# Patient Record
Sex: Female | Born: 1935 | Race: Black or African American | Hispanic: No | Marital: Single | Smoking: Never smoker
Health system: Southern US, Community
[De-identification: ages and names within clinical notes are randomized; demographics above are authoritative.]

## PROBLEM LIST (undated history)

## (undated) DIAGNOSIS — I10 Essential (primary) hypertension: Secondary | ICD-10-CM

## (undated) DIAGNOSIS — M1990 Unspecified osteoarthritis, unspecified site: Secondary | ICD-10-CM

## (undated) DIAGNOSIS — G309 Alzheimer's disease, unspecified: Secondary | ICD-10-CM

## (undated) DIAGNOSIS — H25019 Cortical age-related cataract, unspecified eye: Secondary | ICD-10-CM

## (undated) DIAGNOSIS — H409 Unspecified glaucoma: Secondary | ICD-10-CM

## (undated) DIAGNOSIS — H547 Unspecified visual loss: Secondary | ICD-10-CM

## (undated) DIAGNOSIS — F0280 Dementia in other diseases classified elsewhere without behavioral disturbance: Secondary | ICD-10-CM

## (undated) DIAGNOSIS — F0390 Unspecified dementia without behavioral disturbance: Secondary | ICD-10-CM

## (undated) DIAGNOSIS — H269 Unspecified cataract: Secondary | ICD-10-CM

## (undated) HISTORY — DX: Cortical age-related cataract, unspecified eye: H25.019

## (undated) HISTORY — PX: ABDOMINAL HYSTERECTOMY: SHX81

## (undated) HISTORY — DX: Unspecified osteoarthritis, unspecified site: M19.90

## (undated) HISTORY — DX: Unspecified cataract: H26.9

## (undated) HISTORY — DX: Unspecified glaucoma: H40.9

## (undated) HISTORY — DX: Essential (primary) hypertension: I10

## (undated) HISTORY — PX: CATARACT EXTRACTION: SUR2

## (undated) NOTE — *Deleted (*Deleted)
Pharmacy Antibiotic Note  Erica Vasquez is a 59 y.o. female admitted on 2020-09-11 with sepsis.  Pharmacy has been consulted for vancomycin + cefepime dosing.  Pt has PMH significant for advanced dementia, HTN. Broad spectrum antibiotics being initiated.  Today, 08/22/20  WBC 3.8  SCr 0.82, CrCl ~  Lactate 4.7  Plan:    Height: 5' (152.4 cm) IBW/kg (Calculated) : 45.5  Temp (24hrs), Avg:98 F (36.7 C), Min:97.8 F (36.6 C), Max:98.3 F (36.8 C)  Recent Labs  Lab 09/11/2020 2035 09/11/2020 2130 09/11/2020 2351 08/22/20 0210 08/22/20 1158  WBC 3.4*  --   --  3.8*  --   CREATININE 0.92 0.90  --  0.82  --   LATICACIDVEN 2.1*  --  4.7*  --  4.7*    CrCl cannot be calculated (Unknown ideal weight.).    Allergies  Allergen Reactions  . Bimatoprost Itching  . Brimonidine Itching  . Timolol Itching    Antimicrobials this admission: Cefepime 11/1 >>  Vancomycin 11/1 >>   Dose adjustments this admission:  Microbiology results: 10/31 BCx: ngtd 10/31 UCx:    Thank you for allowing pharmacy to be a part of this patient's care.  Cindi Carbon, PharmD 08/22/2020 7:12 PM

---

## 2007-02-21 ENCOUNTER — Encounter: Admission: RE | Admit: 2007-02-21 | Discharge: 2007-02-21 | Payer: Self-pay | Admitting: Cardiology

## 2007-04-18 ENCOUNTER — Encounter: Admission: RE | Admit: 2007-04-18 | Discharge: 2007-04-18 | Payer: Self-pay | Admitting: Cardiology

## 2008-01-15 ENCOUNTER — Encounter: Admission: RE | Admit: 2008-01-15 | Discharge: 2008-01-15 | Payer: Self-pay | Admitting: Cardiology

## 2008-10-22 HISTORY — PX: GLAUCOMA SURGERY: SHX656

## 2009-08-02 ENCOUNTER — Encounter: Admission: RE | Admit: 2009-08-02 | Discharge: 2009-08-02 | Payer: Self-pay | Admitting: Cardiology

## 2009-09-02 ENCOUNTER — Encounter: Admission: RE | Admit: 2009-09-02 | Discharge: 2009-09-02 | Payer: Self-pay | Admitting: Cardiology

## 2010-11-12 ENCOUNTER — Encounter: Payer: Self-pay | Admitting: Cardiology

## 2010-11-13 ENCOUNTER — Encounter: Payer: Self-pay | Admitting: Cardiology

## 2015-03-16 DIAGNOSIS — H4011X3 Primary open-angle glaucoma, severe stage: Secondary | ICD-10-CM | POA: Diagnosis not present

## 2015-05-18 DIAGNOSIS — H4011X3 Primary open-angle glaucoma, severe stage: Secondary | ICD-10-CM | POA: Diagnosis not present

## 2015-05-18 DIAGNOSIS — G309 Alzheimer's disease, unspecified: Secondary | ICD-10-CM | POA: Diagnosis not present

## 2015-06-02 DIAGNOSIS — R52 Pain, unspecified: Secondary | ICD-10-CM | POA: Diagnosis not present

## 2015-06-02 DIAGNOSIS — K5901 Slow transit constipation: Secondary | ICD-10-CM | POA: Diagnosis not present

## 2018-01-25 ENCOUNTER — Other Ambulatory Visit: Payer: Self-pay

## 2018-01-25 ENCOUNTER — Emergency Department (HOSPITAL_COMMUNITY): Payer: Medicare Other

## 2018-01-25 ENCOUNTER — Encounter (HOSPITAL_COMMUNITY): Payer: Self-pay | Admitting: Emergency Medicine

## 2018-01-25 ENCOUNTER — Inpatient Hospital Stay (HOSPITAL_COMMUNITY)
Admission: EM | Admit: 2018-01-25 | Discharge: 2018-01-30 | DRG: 470 | Disposition: A | Discharge status: Skilled Nursing Facility | Payer: Medicare Other | Attending: Internal Medicine | Admitting: Internal Medicine

## 2018-01-25 DIAGNOSIS — G309 Alzheimer's disease, unspecified: Secondary | ICD-10-CM | POA: Diagnosis present

## 2018-01-25 DIAGNOSIS — M898X5 Other specified disorders of bone, thigh: Secondary | ICD-10-CM | POA: Diagnosis not present

## 2018-01-25 DIAGNOSIS — E440 Moderate protein-calorie malnutrition: Secondary | ICD-10-CM | POA: Diagnosis present

## 2018-01-25 DIAGNOSIS — E876 Hypokalemia: Secondary | ICD-10-CM | POA: Diagnosis present

## 2018-01-25 DIAGNOSIS — H543 Unqualified visual loss, both eyes: Secondary | ICD-10-CM | POA: Diagnosis present

## 2018-01-25 DIAGNOSIS — H547 Unspecified visual loss: Secondary | ICD-10-CM | POA: Diagnosis not present

## 2018-01-25 DIAGNOSIS — W19XXXA Unspecified fall, initial encounter: Secondary | ICD-10-CM | POA: Diagnosis not present

## 2018-01-25 DIAGNOSIS — D62 Acute posthemorrhagic anemia: Secondary | ICD-10-CM | POA: Diagnosis not present

## 2018-01-25 DIAGNOSIS — Z96649 Presence of unspecified artificial hip joint: Secondary | ICD-10-CM

## 2018-01-25 DIAGNOSIS — I10 Essential (primary) hypertension: Secondary | ICD-10-CM | POA: Diagnosis present

## 2018-01-25 DIAGNOSIS — S72001A Fracture of unspecified part of neck of right femur, initial encounter for closed fracture: Secondary | ICD-10-CM | POA: Diagnosis not present

## 2018-01-25 DIAGNOSIS — W1830XA Fall on same level, unspecified, initial encounter: Secondary | ICD-10-CM | POA: Diagnosis present

## 2018-01-25 DIAGNOSIS — R509 Fever, unspecified: Secondary | ICD-10-CM | POA: Diagnosis not present

## 2018-01-25 DIAGNOSIS — F0280 Dementia in other diseases classified elsewhere without behavioral disturbance: Secondary | ICD-10-CM | POA: Diagnosis present

## 2018-01-25 DIAGNOSIS — M899 Disorder of bone, unspecified: Secondary | ICD-10-CM | POA: Diagnosis present

## 2018-01-25 DIAGNOSIS — Z83511 Family history of glaucoma: Secondary | ICD-10-CM

## 2018-01-25 HISTORY — DX: Alzheimer's disease, unspecified: G30.9

## 2018-01-25 HISTORY — DX: Unspecified dementia, unspecified severity, without behavioral disturbance, psychotic disturbance, mood disturbance, and anxiety: F03.90

## 2018-01-25 HISTORY — DX: Unspecified visual loss: H54.7

## 2018-01-25 HISTORY — DX: Dementia in other diseases classified elsewhere without behavioral disturbance: F02.80

## 2018-01-25 HISTORY — DX: Essential (primary) hypertension: I10

## 2018-01-25 HISTORY — DX: Dementia in other diseases classified elsewhere, unspecified severity, without behavioral disturbance, psychotic disturbance, mood disturbance, and anxiety: F02.80

## 2018-01-25 LAB — CBC WITH DIFFERENTIAL/PLATELET
BASOS PCT: 0 %
Basophils Absolute: 0 10*3/uL (ref 0.0–0.1)
EOS ABS: 0 10*3/uL (ref 0.0–0.7)
Eosinophils Relative: 0 %
HCT: 39.8 % (ref 36.0–46.0)
HEMOGLOBIN: 12.5 g/dL (ref 12.0–15.0)
Lymphocytes Relative: 5 %
Lymphs Abs: 0.4 10*3/uL — ABNORMAL LOW (ref 0.7–4.0)
MCH: 26.7 pg (ref 26.0–34.0)
MCHC: 31.4 g/dL (ref 30.0–36.0)
MCV: 84.9 fL (ref 78.0–100.0)
MONO ABS: 0.4 10*3/uL (ref 0.1–1.0)
MONOS PCT: 5 %
NEUTROS PCT: 90 %
Neutro Abs: 7 10*3/uL (ref 1.7–7.7)
Platelets: 175 10*3/uL (ref 150–400)
RBC: 4.69 MIL/uL (ref 3.87–5.11)
RDW: 14.4 % (ref 11.5–15.5)
WBC: 7.8 10*3/uL (ref 4.0–10.5)

## 2018-01-25 LAB — COMPREHENSIVE METABOLIC PANEL
ALK PHOS: 54 U/L (ref 38–126)
ALT: 11 U/L — AB (ref 14–54)
AST: 26 U/L (ref 15–41)
Albumin: 3.4 g/dL — ABNORMAL LOW (ref 3.5–5.0)
Anion gap: 9 (ref 5–15)
BILIRUBIN TOTAL: 1.1 mg/dL (ref 0.3–1.2)
BUN: 10 mg/dL (ref 6–20)
CALCIUM: 8.8 mg/dL — AB (ref 8.9–10.3)
CO2: 25 mmol/L (ref 22–32)
CREATININE: 0.81 mg/dL (ref 0.44–1.00)
Chloride: 105 mmol/L (ref 101–111)
GFR calc Af Amer: >60 mL/min (ref >60)
GFR calc non Af Amer: >60 mL/min (ref >60)
Glucose, Bld: 135 mg/dL — ABNORMAL HIGH (ref 65–99)
Potassium: 3.3 mmol/L — ABNORMAL LOW (ref 3.5–5.1)
SODIUM: 139 mmol/L (ref 135–145)
TOTAL PROTEIN: 5.8 g/dL — AB (ref 6.5–8.1)

## 2018-01-25 LAB — TYPE AND SCREEN
ABO/RH(D): O POS
ANTIBODY SCREEN: NEGATIVE

## 2018-01-25 LAB — ABO/RH: ABO/RH(D): O POS

## 2018-01-25 LAB — APTT: aPTT: 26 seconds (ref 24–36)

## 2018-01-25 LAB — PROTIME-INR
INR: 1.06
Prothrombin Time: 13.7 seconds (ref 11.4–15.2)

## 2018-01-25 MED ORDER — HYDROCODONE-ACETAMINOPHEN 5-325 MG PO TABS
0.5000 | ORAL_TABLET | Freq: Once | ORAL | Status: AC
  Administered 2018-01-25: 0.5 via ORAL
  Filled 2018-01-25: qty 1

## 2018-01-25 MED ORDER — METHOCARBAMOL 500 MG PO TABS
500.0000 mg | ORAL_TABLET | Freq: Three times a day (TID) | ORAL | Status: DC | PRN
  Administered 2018-01-27: 500 mg via ORAL
  Filled 2018-01-25: qty 1

## 2018-01-25 MED ORDER — ONDANSETRON HCL 4 MG/2ML IJ SOLN
4.0000 mg | Freq: Three times a day (TID) | INTRAMUSCULAR | Status: DC | PRN
  Administered 2018-01-26: 4 mg via INTRAVENOUS

## 2018-01-25 MED ORDER — HYDROCODONE-ACETAMINOPHEN 5-325 MG PO TABS: 0.5000 | ORAL_TABLET | Freq: Four times a day (QID) | ORAL | Status: DC | PRN

## 2018-01-25 MED ORDER — ACETAMINOPHEN 325 MG PO TABS: 325.0000 mg | ORAL_TABLET | Freq: Four times a day (QID) | ORAL | Status: DC | PRN

## 2018-01-25 MED ORDER — POTASSIUM CHLORIDE 20 MEQ/15ML (10%) PO SOLN
40.0000 meq | Freq: Once | ORAL | Status: AC
  Administered 2018-01-25: 40 meq via ORAL
  Filled 2018-01-25: qty 30

## 2018-01-25 MED ORDER — MORPHINE SULFATE (PF) 4 MG/ML IV SOLN
0.5000 mg | INTRAVENOUS | Status: DC | PRN
  Administered 2018-01-26: 0.52 mg via INTRAVENOUS
  Filled 2018-01-25: qty 1

## 2018-01-25 MED ORDER — ZOLPIDEM TARTRATE 5 MG PO TABS
5.0000 mg | ORAL_TABLET | Freq: Every evening | ORAL | Status: DC | PRN
Start: 2018-01-25 — End: 2018-01-30

## 2018-01-25 MED ORDER — HYDRALAZINE HCL 20 MG/ML IJ SOLN: 5.0000 mg | INTRAMUSCULAR | Status: DC | PRN

## 2018-01-25 MED ORDER — SODIUM CHLORIDE 0.9 % IV SOLN
INTRAVENOUS | Status: DC
  Administered 2018-01-25: 22:00:00 via INTRAVENOUS

## 2018-01-25 MED ORDER — SENNOSIDES-DOCUSATE SODIUM 8.6-50 MG PO TABS
1.0000 | ORAL_TABLET | Freq: Every evening | ORAL | Status: DC | PRN
  Administered 2018-01-27: 1 via ORAL
  Filled 2018-01-25: qty 1

## 2018-01-25 MED ORDER — AMLODIPINE BESYLATE 2.5 MG PO TABS
2.5000 mg | ORAL_TABLET | Freq: Every day | ORAL | Status: DC
  Administered 2018-01-25 – 2018-01-30 (×5): 2.5 mg via ORAL
  Filled 2018-01-25 (×5): qty 1

## 2018-01-25 NOTE — H&P (Signed)
History and Physical    Erica Vasquez WUJ:[id number] DOB: Feb 10, 1930 DOA: 01/25/2018  Referring MD/NP/PA:   PCP: Mirna MiresHill, [name], MD   Patient coming from:  The patient is coming from home.  At baseline, pt is independent for most of ADL.   Chief Complaint: fall and right hip pain  HPI: Erica CarolMontez P Steuck is a 84 y.o. female with medical history significant of hypertension, dementia, bilateral blindness, who presents with fall and right hip pain.  Per patient's niece, patient fell when doing routine exercise at the bedside at about 11:30 AM. She developed pain in the right hip, which is constant, severe, sharp, nonradiating. No leg numbness. Per her niece, patient did not have LOC, does not seem to have head injury. Patient does not have unilateral weakness, numbness or tingling in his extremities. No facial droop, slurred speech. No chest pain, shortness of breath, cough, fever or chills. No nausea, vomiting, diarrhea or abdominal pain. No symptoms of UTI.  ED Course: pt was found to have WBC 7.8, potassium 3.3, creatinine normal, temperature normal, no tachycardia, oxygen saturation 97% on 2 L nasal cannula oxygen, CT head is negative for acute intracranial abnormalities. X-ray of pelvis showed right femoral neck fracture, and also showed 1.51.6 cm of lytic lesion in right greater trochanter. Pt is admitted to MedSurg bed as inpatient. Orthopedic surgeon, Dr. Roda ShuttersXU was consulted.  Review of Systems: could not be reviewed accurately due to dementia, but pt has right hip pain.  Allergy:  Allergies  Allergen Reactions  . Bimatoprost Itching  . Brimonidine Itching  . Timolol Itching    Past Medical History:  Diagnosis Date  . Alzheimer disease   . Blind   . Dementia   . Essential hypertension     Past Surgical History:  Procedure Laterality Date  . ABDOMINAL HYSTERECTOMY    . CATARACT EXTRACTION      Social History:  reports that she has never smoked. She has never used smokeless  tobacco. She reports that she drank alcohol. She reports that she has current or past drug history.  Family History:  Family History  Problem Relation Age of Onset  . Dementia Mother   . Dementia Father   . Glaucoma Father      Prior to Admission medications   Not on File    Physical Exam: Vitals:   01/25/18 1922 01/25/18 1930 01/25/18 2000 01/25/18 2030  BP: (!) 155/78 (!) 159/84 (!) 159/86 (!) 150/85  Pulse: 98 (!) 103 (!) 102 99  Resp: (!) 28 18 19 19   Temp:      TempSrc:      SpO2: 97% 99% 100% 99%  Weight:      Height:       General: Not in acute distress HEENT:       Eyes: bilateral blindness, no scleral icterus.       ENT: No discharge from the ears and nose, no pharynx injection, no tonsillar enlargement.        Neck: No JVD, no bruit, no mass felt. Heme: No neck lymph node enlargement. Cardiac: S1/S2, RRR, No murmurs, No gallops or rubs. Respiratory: No rales, wheezing, rhonchi or rubs. GI: Soft, nondistended, nontender, no rebound pain, no organomegaly, BS present. GU: No hematuria Ext: No pitting leg edema bilaterally. 2+DP/PT pulse bilaterally. Musculoskeletal: No joint deformities, No joint redness or warmth, no limitation of ROM in spin. Skin: No rashes.  Neuro: Alert, knows her own name, but not oriented to place and time,  bilateral blindenss, moves all extremities.  Psych: Patient is not psychotic, no suicidal or hemocidal ideation.  Labs on Admission: I have personally reviewed following labs and imaging studies  CBC: Recent Labs  Lab 01/25/18 1852  WBC 7.8  NEUTROABS 7.0  HGB 12.5  HCT 39.8  MCV 84.9  PLT 175   Basic Metabolic Panel: Recent Labs  Lab 01/25/18 1852  NA 139  K 3.3*  CL 105  CO2 25  GLUCOSE 135*  BUN 10  CREATININE 0.81  CALCIUM 8.8*   GFR: Estimated Creatinine Clearance: 34.1 mL/min (by C-G formula based on SCr of 0.81 mg/dL). Liver Function Tests: Recent Labs  Lab 01/25/18 1852  AST 26  ALT 11*  ALKPHOS 54    BILITOT 1.1  PROT 5.8*  ALBUMIN 3.4*   No results for input(s): LIPASE, AMYLASE in the last 168 hours. No results for input(s): AMMONIA in the last 168 hours. Coagulation Profile: No results for input(s): INR, PROTIME in the last 168 hours. Cardiac Enzymes: No results for input(s): CKTOTAL, CKMB, CKMBINDEX, TROPONINI in the last 168 hours. BNP (last 3 results) No results for input(s): PROBNP in the last 8760 hours. HbA1C: No results for input(s): HGBA1C in the last 72 hours. CBG: No results for input(s): GLUCAP in the last 168 hours. Lipid Profile: No results for input(s): CHOL, HDL, LDLCALC, TRIG, CHOLHDL, LDLDIRECT in the last 72 hours. Thyroid Function Tests: No results for input(s): TSH, T4TOTAL, FREET4, T3FREE, THYROIDAB in the last 72 hours. Anemia Panel: No results for input(s): VITAMINB12, FOLATE, FERRITIN, TIBC, IRON, RETICCTPCT in the last 72 hours. Urine analysis: No results found for: COLORURINE, APPEARANCEUR, LABSPEC, PHURINE, GLUCOSEU, HGBUR, BILIRUBINUR, KETONESUR, PROTEINUR, UROBILINOGEN, NITRITE, LEUKOCYTESUR Sepsis Labs: @LABRCNTIP (procalcitonin:4,lacticidven:4) )No results found for this or any previous visit (from the past 240 hour(s)).   Radiological Exams on Admission: Ct Head Wo Contrast  Result Date: 01/25/2018 CLINICAL DATA:  Pt has Alzheimer Larey Seat and had a slight bump to her head but there are no bruises or cuts EXAM: CT HEAD WITHOUT CONTRAST TECHNIQUE: Contiguous axial images were obtained from the base of the skull through the vertex without intravenous contrast. COMPARISON:  08/02/2009 FINDINGS: Brain: No evidence of acute infarction, hemorrhage, hydrocephalus, extra-axial collection or mass lesion/mass effect. There is ventricular and sulcal enlargement reflecting generalized atrophy, increased when compared to the prior CT. Vascular: No hyperdense vessel or unexpected calcification. Skull: Normal. Negative for fracture or focal lesion. Sinuses/Orbits:  Visualize globes and orbits are unremarkable. Visualized sinuses and mastoid air cells are clear. Other: None. IMPRESSION: 1. No acute intracranial abnormalities. 2. Atrophy increased when compared to the prior head CT. Electronically Signed   By: Amie Portland M.D.   On: 01/25/2018 18:38   Dg Hips Bilat With Pelvis Min 5 Views  Result Date: 01/25/2018 CLINICAL DATA:  Status post fall with bilateral hip pain. EXAM: DG HIP (WITH OR WITHOUT PELVIS) 5+V BILAT COMPARISON:  None. FINDINGS: There is a fracture of the right femoral neck. There is no dislocation. There is a 1.5 x 1.6 cm lytic lesion in the left greater trochanter. Degenerative joint changes of the spine are noted. Extensive bowel content is identified in the visualized colon. IMPRESSION: Fracture of the right femoral neck.  There is no dislocation. 1.5 x 1.6 cm lytic lesion in the left greater trochanter. This is nonspecific. Differential diagnosis includes but is not limited to myeloma lesion, metastasis. Clinical correlation is recommended. Electronically Signed   By: Sherian Rein M.D.   On: 01/25/2018  17:51     EKG:  Not done in ED, will get one.   Assessment/Plan Principal Problem:   Fracture of femoral neck, right, closed (HCC) Active Problems:   Fall   Blindness   Hypokalemia   Essential hypertension   Lytic bone lesion of hip   Fracture of femoral neck, right, closed (HCC): As evidenced by x-ray. Patient has moderate pain now. No neurovascular compromise. Orthopedic surgeon was consulted. Dr. Roda Shutters will see pt.  - will admit to Med-surg bed as inpt - Pain control: morphine prn and percocet - When necessary Zofran for nausea - Robaxin for muscle spasm - type and cross - INR/PTT - PT/OT when able to (not ordered now)  Fall: seems to have a Curator fall due to blindenss -fall precaution -- PT/OT when able to  Hypokalemia: K=3.3 on admission. - Repleted - Check Mg level  Lytic bone lesion of hip: incidental findings  of by x-ray, lytic lesion in the left greater trochanter. -will get SPEP to r/o MM  HTN: not on meds at home. bp is 150/85 -IV hydralazine prn -start amlodipine 2.5 mg daily    DVT ppx: SCD Code Status: Full code Family Communication:   Yes, patient's niece   at bed side Disposition Plan:  To to detemined Consults called:  Ortho, Dr. Roda Shutters Admission status: medical floor/obs    Date of Service 01/25/2018    Erica Vasquez [facility] Pager [phone]  If 7PM-7AM, please contact night-coverage [email or web address] Password [facility] 01/25/2018, 8:58 PM

## 2018-01-25 NOTE — ED Notes (Signed)
Pt's O2 sat noted to be 85% on RA.  2L O2 applied via [place] w/ improvement in O2 sat to 94%.

## 2018-01-25 NOTE — ED Provider Notes (Addendum)
[facility] EMERGENCY DEPARTMENT Provider Note   CSN: [id number] Arrival date & time: 01/25/18  1422     History   Chief Complaint Chief Complaint  Patient presents with  . Fall  . Leg Pain    HPI Erica Vasquez is a 84 y.o. female.  The history is provided by a caregiver. No language interpreter was used.  Fall   Leg Pain      Erica Vasquez is a 84 y.o. female who presents to the Emergency Department complaining of fall. Level V caveat due to dementia. History is provided by the patient's caregivers. Her caregiver states that he was helping her change and she was standing at the bedside. He walked away from the bed and she fell down, striking her left side. There was no loss of consciousness. She has dementia and is blind at baseline. She does ambulate with assistance for guidance. She is able to walk up and down stairs with assistance. She does have a chronic deformity to her right knee. Since the fall she has been unable to stand or bear weight due to pain. It is unclear if she is having pain in the left side of the right side.  Past Medical History:  Diagnosis Date  . Alzheimer disease   . Blind   . Dementia     There are no active problems to display for this patient.   History reviewed. No pertinent surgical history.   OB History   None      Home Medications    Prior to Admission medications   Not on File    Family History No family history on file.  Social History Social History   Tobacco Use  . Smoking status: Never Smoker  . Smokeless tobacco: Never Used  Substance Use Topics  . Alcohol use: Not Currently  . Drug use: Not Currently     Allergies   Bimatoprost; Brimonidine; and Timolol   Review of Systems Review of Systems  Unable to perform ROS: Dementia     Physical Exam Updated Vital Signs BP (!) 155/78   Pulse 98   Temp 97.7 F (36.5 C) (Axillary)   Resp (!) 28   Ht 4\' 11"  (1.499 m)   Wt 42.6 kg  (94 lb)   SpO2 97%   BMI 18.99 kg/m   Physical Exam  Constitutional: She appears well-developed and well-nourished.  HENT:  Head: Normocephalic and atraumatic.  Cardiovascular: Normal rate and regular rhythm.  No murmur heard. Pulmonary/Chest: Effort normal and breath sounds normal. No respiratory distress.  Abdominal: Soft. There is no tenderness. There is no rebound and no guarding.  Musculoskeletal:  2+ DP pulses bilaterally. Chronic appearing deformity to the right knee. Right lower extremity is externally rotated and shortened. There is tenderness to palpation over the lower extremities but unclear to pinpoint which area is tender. She moves the left leg more than the right leg.  Neurological: She is alert.  Confused. Disoriented to place in time.  Skin: Skin is warm and dry.  Psychiatric:  Unable to assess  Nursing note and vitals reviewed.    ED Treatments / Results  Labs (all labs ordered are listed, but only abnormal results are displayed) Labs Reviewed  COMPREHENSIVE METABOLIC PANEL - Abnormal; Notable for the following components:      Result Value   Potassium 3.3 (*)    Glucose, Bld 135 (*)    Calcium 8.8 (*)    Total Protein 5.8 (*)  Albumin 3.4 (*)    ALT 11 (*)    All other components within normal limits  CBC WITH DIFFERENTIAL/PLATELET - Abnormal; Notable for the following components:   Lymphs Abs 0.4 (*)    All other components within normal limits  URINALYSIS, ROUTINE W REFLEX MICROSCOPIC  TYPE AND SCREEN  ABO/RH    EKG EKG Interpretation  Date/Time:  Saturday January 25 2018 19:01:11 EDT Ventricular Rate:  105 PR Interval:    QRS Duration: 80 QT Interval:  327 QTC Calculation: 433 R Axis:   -38 Text Interpretation:  Sinus tachycardia Ventricular premature complex Inferior infarct, old Anteroseptal infarct, age indeterminate Baseline wander in lead(s) II III aVF Interpretation limited secondary to artifact Confirmed by Tilden Fossaees, Janiece Scovill [phone]([id number])  on 01/25/2018 7:06:59 PM   Radiology Ct Head Wo Contrast  Result Date: 01/25/2018 CLINICAL DATA:  Pt has Alzheimer Larey SeatFell and had a slight bump to her head but there are no bruises or cuts EXAM: CT HEAD WITHOUT CONTRAST TECHNIQUE: Contiguous axial images were obtained from the base of the skull through the vertex without intravenous contrast. COMPARISON:  08/02/2009 FINDINGS: Brain: No evidence of acute infarction, hemorrhage, hydrocephalus, extra-axial collection or mass lesion/mass effect. There is ventricular and sulcal enlargement reflecting generalized atrophy, increased when compared to the prior CT. Vascular: No hyperdense vessel or unexpected calcification. Skull: Normal. Negative for fracture or focal lesion. Sinuses/Orbits: Visualize globes and orbits are unremarkable. Visualized sinuses and mastoid air cells are clear. Other: None. IMPRESSION: 1. No acute intracranial abnormalities. 2. Atrophy increased when compared to the prior head CT. Electronically Signed   By: Amie Portlandavid  Ormond M.D.   On: 01/25/2018 18:38   Dg Hips Bilat With Pelvis Min 5 Views  Result Date: 01/25/2018 CLINICAL DATA:  Status post fall with bilateral hip pain. EXAM: DG HIP (WITH OR WITHOUT PELVIS) 5+V BILAT COMPARISON:  None. FINDINGS: There is a fracture of the right femoral neck. There is no dislocation. There is a 1.5 x 1.6 cm lytic lesion in the left greater trochanter. Degenerative joint changes of the spine are noted. Extensive bowel content is identified in the visualized colon. IMPRESSION: Fracture of the right femoral neck.  There is no dislocation. 1.5 x 1.6 cm lytic lesion in the left greater trochanter. This is nonspecific. Differential diagnosis includes but is not limited to myeloma lesion, metastasis. Clinical correlation is recommended. Electronically Signed   By: Sherian ReinWei-Chen  Lin M.D.   On: 01/25/2018 17:51    Procedures Procedures (including critical care time)  Medications Ordered in ED Medications    HYDROcodone-acetaminophen (NORCO/VICODIN) 5-325 MG per tablet 0.5 tablet (0.5 tablets Oral Given 01/25/18 1652)     Initial Impression / Assessment and Plan / ED Course  I have reviewed the triage vital signs and the nursing notes.  Pertinent labs & imaging results that were available during my care of the patient were reviewed by me and considered in my medical decision making (see chart for details).     Patient here for evaluation of injuries following an unwitnessed fall. She has a right femoral neck fracture. Family states that she follows with [facility]. Discussed with Dr. Roda ShuttersXu, who will see the patient and consult. No evidence of additional injuries secondary to this fall. Patient without significant pain on examination in the emergency department. IV pain medications were declined by family. Hospitalist consulted for admission for further treatment.  [name], Erica Vasquez is her power of attorney. He can be reached at [phone]. Final Clinical Impressions(s) / ED Diagnoses   Final diagnoses:  Fall    ED Discharge Orders    None       Tilden Fossa, MD 01/25/18 2017    Tilden Fossa, MD 01/25/18 2049

## 2018-01-25 NOTE — Consult Note (Signed)
ORTHOPAEDIC CONSULTATION  REQUESTING PHYSICIAN: Lorretta HarpNiu, Xilin, MD  Chief Complaint: Right femoral neck hip fracture  HPI: Erica Vasquez is a 84 y.o. female who presents with right femoral neck hip fracture s/p mechanical fall PTA.  The patient endorses severe pain in the right hip, that does not radiate, grinding in quality, worse with any movement, better with immobilization.  Denies LOC/fever/chills/nausea/vomiting.  Walks with assistance from family members secondary to blindness.  Does live with family.  Denies LOC, neck pain, abd pain.  Past Medical History:  Diagnosis Date  . Alzheimer disease   . Blind   . Dementia   . Essential hypertension    Past Surgical History:  Procedure Laterality Date  . ABDOMINAL HYSTERECTOMY    . CATARACT EXTRACTION     Social History   Socioeconomic History  . Marital status: Single    Spouse name: Not on file  . Number of children: Not on file  . Years of education: Not on file  . Highest education level: Not on file  Occupational History  . Not on file  Social Needs  . Financial resource strain: Not on file  . Food insecurity:    Worry: Not on file    Inability: Not on file  . Transportation needs:    Medical: Not on file    Non-medical: Not on file  Tobacco Use  . Smoking status: Never Smoker  . Smokeless tobacco: Never Used  Substance and Sexual Activity  . Alcohol use: Not Currently  . Drug use: Not Currently  . Sexual activity: Not on file  Lifestyle  . Physical activity:    Days per week: Not on file    Minutes per session: Not on file  . Stress: Not on file  Relationships  . Social connections:    Talks on phone: Not on file    Gets together: Not on file    Attends religious service: Not on file    Active member of club or organization: Not on file    Attends meetings of clubs or organizations: Not on file    Relationship status: Not on file  Other Topics Concern  . Not on file  Social History Narrative  .  Not on file   Family History  Problem Relation Age of Onset  . Dementia Mother   . Dementia Father   . Glaucoma Father    Allergies  Allergen Reactions  . Bimatoprost Itching  . Brimonidine Itching  . Timolol Itching   Prior to Admission medications   Not on File   Ct Head Wo Contrast  Result Date: 01/25/2018 CLINICAL DATA:  Pt has Alzheimer Larey SeatFell and had a slight bump to her head but there are no bruises or cuts EXAM: CT HEAD WITHOUT CONTRAST TECHNIQUE: Contiguous axial images were obtained from the base of the skull through the vertex without intravenous contrast. COMPARISON:  08/02/2009 FINDINGS: Brain: No evidence of acute infarction, hemorrhage, hydrocephalus, extra-axial collection or mass lesion/mass effect. There is ventricular and sulcal enlargement reflecting generalized atrophy, increased when compared to the prior CT. Vascular: No hyperdense vessel or unexpected calcification. Skull: Normal. Negative for fracture or focal lesion. Sinuses/Orbits: Visualize globes and orbits are unremarkable. Visualized sinuses and mastoid air cells are clear. Other: None. IMPRESSION: 1. No acute intracranial abnormalities. 2. Atrophy increased when compared to the prior head CT. Electronically Signed   By: Amie Portlandavid  Ormond M.D.   On: 01/25/2018 18:38   Dg Hips Bilat With Pelvis Min 5  Views  Result Date: 01/25/2018 CLINICAL DATA:  Status post fall with bilateral hip pain. EXAM: DG HIP (WITH OR WITHOUT PELVIS) 5+V BILAT COMPARISON:  None. FINDINGS: There is a fracture of the right femoral neck. There is no dislocation. There is a 1.5 x 1.6 cm lytic lesion in the left greater trochanter. Degenerative joint changes of the spine are noted. Extensive bowel content is identified in the visualized colon. IMPRESSION: Fracture of the right femoral neck.  There is no dislocation. 1.5 x 1.6 cm lytic lesion in the left greater trochanter. This is nonspecific. Differential diagnosis includes but is not limited to  myeloma lesion, metastasis. Clinical correlation is recommended. Electronically Signed   By: Sherian Rein M.D.   On: 01/25/2018 17:51    All pertinent xrays, MRI, CT independently reviewed and interpreted  Positive ROS: All other systems have been reviewed and were otherwise negative with the exception of those mentioned in the HPI and as above.  Physical Exam: General: Alert, no acute distress Cardiovascular: No pedal edema Respiratory: No cyanosis, no use of accessory musculature GI: No organomegaly, abdomen is soft and non-tender Skin: No lesions in the area of chief complaint Neurologic: Sensation intact distally Psychiatric: Patient is competent for consent with normal mood and affect Lymphatic: No axillary or cervical lymphadenopathy  MUSCULOSKELETAL:  - pain with movement of the hip and extremity - skin intact - NVI distally - compartments soft  Assessment: Right femoral neck hip fracture Left greater trochanter lesion  Plan: - right partial hip replacement is recommended, family are aware of r/b/a and will discuss amongst themselves and let us know in the morning what they decide - they understand that surgery is the best option for pain relief so that she can have any meaningful quality of life and ability to rehab and recover from this injury - NPO after midnight   Thank you for the consult and the opportunity to see Erica Vasquez. Glee Arvin, MD [facility] [phone] 10:16 PM

## 2018-01-25 NOTE — Progress Notes (Signed)
Orthopedic Tech Progress Note Patient Details:  Seward CarolMontez P Vicencio May 28, 1930 [id number]  Ortho Devices Ortho Device/Splint Location: Trapeze bar   Post Interventions Patient Tolerated: Unable to use device properly   Saul FordyceJennifer C Marion Rosenberry 01/25/2018, 9:21 PM

## 2018-01-26 ENCOUNTER — Encounter (HOSPITAL_COMMUNITY): Payer: Self-pay | Admitting: Critical Care Medicine

## 2018-01-26 ENCOUNTER — Inpatient Hospital Stay (HOSPITAL_COMMUNITY): Payer: Medicare Other | Admitting: Critical Care Medicine

## 2018-01-26 ENCOUNTER — Encounter (HOSPITAL_COMMUNITY)
Admission: EM | Disposition: A | Discharge status: Skilled Nursing Facility | Payer: Self-pay | Source: Home / Self Care | Attending: Internal Medicine

## 2018-01-26 ENCOUNTER — Inpatient Hospital Stay (HOSPITAL_COMMUNITY): Payer: Medicare Other

## 2018-01-26 DIAGNOSIS — M898X5 Other specified disorders of bone, thigh: Secondary | ICD-10-CM

## 2018-01-26 DIAGNOSIS — S72001A Fracture of unspecified part of neck of right femur, initial encounter for closed fracture: Secondary | ICD-10-CM

## 2018-01-26 HISTORY — PX: TOTAL HIP ARTHROPLASTY: SHX124

## 2018-01-26 LAB — CBC
HCT: 35.7 % — ABNORMAL LOW (ref 36.0–46.0)
HEMATOCRIT: 35.5 % — AB (ref 36.0–46.0)
Hemoglobin: 11.3 g/dL — ABNORMAL LOW (ref 12.0–15.0)
Hemoglobin: 11.5 g/dL — ABNORMAL LOW (ref 12.0–15.0)
MCH: 26.7 pg (ref 26.0–34.0)
MCH: 28.1 pg (ref 26.0–34.0)
MCHC: 31.7 g/dL (ref 30.0–36.0)
MCHC: 32.4 g/dL (ref 30.0–36.0)
MCV: 84.2 fL (ref 78.0–100.0)
MCV: 86.8 fL (ref 78.0–100.0)
PLATELETS: 141 10*3/uL — AB (ref 150–400)
Platelets: 123 10*3/uL — ABNORMAL LOW (ref 150–400)
RBC: 4.24 MIL/uL (ref 3.87–5.11)
RBC: 4.09 MIL/uL (ref 3.87–5.11)
RDW: 14.4 % (ref 11.5–15.5)
RDW: 14.6 % (ref 11.5–15.5)
WBC: 5.3 10*3/uL (ref 4.0–10.5)
WBC: 6.2 10*3/uL (ref 4.0–10.5)

## 2018-01-26 LAB — CREATININE, SERUM
Creatinine, Ser: 0.75 mg/dL (ref 0.44–1.00)
GFR calc non Af Amer: >60 mL/min (ref >60)

## 2018-01-26 LAB — BASIC METABOLIC PANEL
Anion gap: 10 (ref 5–15)
BUN: 6 mg/dL (ref 6–20)
CHLORIDE: 105 mmol/L (ref 101–111)
CO2: 23 mmol/L (ref 22–32)
CREATININE: 0.7 mg/dL (ref 0.44–1.00)
Calcium: 8.4 mg/dL — ABNORMAL LOW (ref 8.9–10.3)
GFR calc Af Amer: >60 mL/min (ref >60)
GFR calc non Af Amer: >60 mL/min (ref >60)
GLUCOSE: 120 mg/dL — AB (ref 65–99)
Potassium: 3.3 mmol/L — ABNORMAL LOW (ref 3.5–5.1)
Sodium: 138 mmol/L (ref 135–145)

## 2018-01-26 LAB — MRSA PCR SCREENING: MRSA by PCR: NEGATIVE

## 2018-01-26 LAB — MAGNESIUM: Magnesium: 1.8 mg/dL (ref 1.7–2.4)

## 2018-01-26 SURGERY — ARTHROPLASTY, HIP, TOTAL, ANTERIOR APPROACH
Anesthesia: Monitor Anesthesia Care | Site: Hip | Laterality: Right

## 2018-01-26 MED ORDER — POVIDONE-IODINE 10 % EX SWAB: 2.0000 "application " | Freq: Once | CUTANEOUS | Status: DC

## 2018-01-26 MED ORDER — SODIUM CHLORIDE 0.9 % IV SOLN
1000.0000 mg | INTRAVENOUS | Status: AC
  Administered 2018-01-26: 1000 mg via INTRAVENOUS
  Filled 2018-01-26 (×2): qty 10

## 2018-01-26 MED ORDER — ACETAMINOPHEN 325 MG PO TABS: 325.0000 mg | ORAL_TABLET | Freq: Four times a day (QID) | ORAL | Status: DC | PRN

## 2018-01-26 MED ORDER — MORPHINE SULFATE (PF) 2 MG/ML IV SOLN
0.5000 mg | INTRAVENOUS | Status: DC | PRN
  Administered 2018-01-26 – 2018-01-27 (×2): 1 mg via INTRAVENOUS
  Administered 2018-01-28: 0.5 mg via INTRAVENOUS
  Filled 2018-01-26 (×3): qty 1

## 2018-01-26 MED ORDER — TRANEXAMIC ACID 1000 MG/10ML IV SOLN
INTRAVENOUS | Status: AC | PRN
  Administered 2018-01-26: 2000 mg via TOPICAL

## 2018-01-26 MED ORDER — METOCLOPRAMIDE HCL 5 MG PO TABS: 5.0000 mg | ORAL_TABLET | Freq: Three times a day (TID) | ORAL | Status: DC | PRN

## 2018-01-26 MED ORDER — OXYCODONE-ACETAMINOPHEN 5-325 MG PO TABS: 1.0000 | ORAL_TABLET | ORAL | 0 refills | Status: DC | PRN

## 2018-01-26 MED ORDER — ENOXAPARIN SODIUM 30 MG/0.3ML ~~LOC~~ SOLN
30.0000 mg | SUBCUTANEOUS | Status: DC
  Administered 2018-01-27 – 2018-01-30 (×4): 30 mg via SUBCUTANEOUS
  Filled 2018-01-26 (×4): qty 0.3

## 2018-01-26 MED ORDER — SODIUM CHLORIDE 0.9 % IV SOLN
INTRAVENOUS | Status: DC
  Administered 2018-01-26 (×2): via INTRAVENOUS

## 2018-01-26 MED ORDER — METOCLOPRAMIDE HCL 5 MG/ML IJ SOLN: 5.0000 mg | Freq: Three times a day (TID) | INTRAMUSCULAR | Status: DC | PRN

## 2018-01-26 MED ORDER — ONDANSETRON HCL 4 MG/2ML IJ SOLN
INTRAMUSCULAR | Status: AC
  Filled 2018-01-26: qty 2

## 2018-01-26 MED ORDER — HYDROCODONE-ACETAMINOPHEN 7.5-325 MG PO TABS: 1.0000 | ORAL_TABLET | ORAL | Status: DC | PRN

## 2018-01-26 MED ORDER — PROPOFOL 500 MG/50ML IV EMUL
INTRAVENOUS | Status: DC | PRN
  Administered 2018-01-26: 25 ug/kg/min via INTRAVENOUS

## 2018-01-26 MED ORDER — ONDANSETRON HCL 4 MG PO TABS: 4.0000 mg | ORAL_TABLET | Freq: Four times a day (QID) | ORAL | Status: DC | PRN

## 2018-01-26 MED ORDER — SODIUM CHLORIDE 0.9 % IR SOLN
Status: DC | PRN
  Administered 2018-01-26: 3000 mL via INTRAVESICAL

## 2018-01-26 MED ORDER — ESMOLOL HCL 100 MG/10ML IV SOLN
INTRAVENOUS | Status: AC
  Filled 2018-01-26: qty 10

## 2018-01-26 MED ORDER — CEFAZOLIN SODIUM-DEXTROSE 2-4 GM/100ML-% IV SOLN
2.0000 g | INTRAVENOUS | Status: AC
  Administered 2018-01-26: 1 g via INTRAVENOUS
  Filled 2018-01-26: qty 100

## 2018-01-26 MED ORDER — VANCOMYCIN HCL 1000 MG IV SOLR
INTRAVENOUS | Status: DC | PRN
  Administered 2018-01-26: 1000 mg via TOPICAL

## 2018-01-26 MED ORDER — ONDANSETRON HCL 4 MG/2ML IJ SOLN: 4.0000 mg | Freq: Four times a day (QID) | INTRAMUSCULAR | Status: DC | PRN

## 2018-01-26 MED ORDER — PHENYLEPHRINE 40 MCG/ML (10ML) SYRINGE FOR IV PUSH (FOR BLOOD PRESSURE SUPPORT)
PREFILLED_SYRINGE | INTRAVENOUS | Status: DC | PRN
  Administered 2018-01-26 (×2): 40 ug via INTRAVENOUS

## 2018-01-26 MED ORDER — ALUM & MAG HYDROXIDE-SIMETH 200-200-20 MG/5ML PO SUSP: 30.0000 mL | ORAL | Status: DC | PRN

## 2018-01-26 MED ORDER — FENTANYL CITRATE (PF) 250 MCG/5ML IJ SOLN
INTRAMUSCULAR | Status: AC
  Filled 2018-01-26: qty 5

## 2018-01-26 MED ORDER — BUPIVACAINE IN DEXTROSE 0.75-8.25 % IT SOLN
INTRATHECAL | Status: DC | PRN
  Administered 2018-01-26: 12 mg via INTRATHECAL

## 2018-01-26 MED ORDER — CEFAZOLIN SODIUM-DEXTROSE 1-4 GM/50ML-% IV SOLN
1.0000 g | Freq: Two times a day (BID) | INTRAVENOUS | Status: AC
  Administered 2018-01-26 – 2018-01-27 (×2): 1 g via INTRAVENOUS
  Filled 2018-01-26 (×2): qty 50

## 2018-01-26 MED ORDER — ENOXAPARIN SODIUM 40 MG/0.4ML ~~LOC~~ SOLN: 40.0000 mg | Freq: Every day | SUBCUTANEOUS | 0 refills | Status: DC

## 2018-01-26 MED ORDER — VANCOMYCIN HCL 1000 MG IV SOLR
INTRAVENOUS | Status: AC
  Filled 2018-01-26: qty 1000

## 2018-01-26 MED ORDER — TRANEXAMIC ACID 1000 MG/10ML IV SOLN
2000.0000 mg | Freq: Once | INTRAVENOUS | Status: DC
  Filled 2018-01-26: qty 20

## 2018-01-26 MED ORDER — DOCUSATE SODIUM 100 MG PO CAPS
100.0000 mg | ORAL_CAPSULE | Freq: Two times a day (BID) | ORAL | Status: DC
  Filled 2018-01-26 (×2): qty 1

## 2018-01-26 MED ORDER — PROPOFOL 10 MG/ML IV BOLUS
INTRAVENOUS | Status: DC | PRN
  Administered 2018-01-26 (×3): 10 mg via INTRAVENOUS
  Administered 2018-01-26: 20 mg via INTRAVENOUS

## 2018-01-26 MED ORDER — PROPOFOL 10 MG/ML IV BOLUS
INTRAVENOUS | Status: AC
  Filled 2018-01-26: qty 20

## 2018-01-26 MED ORDER — 0.9 % SODIUM CHLORIDE (POUR BTL) OPTIME
TOPICAL | Status: DC | PRN
  Administered 2018-01-26: 1000 mL

## 2018-01-26 MED ORDER — DEXAMETHASONE SODIUM PHOSPHATE 10 MG/ML IJ SOLN
INTRAMUSCULAR | Status: DC | PRN
  Administered 2018-01-26: 4 mg via INTRAVENOUS

## 2018-01-26 MED ORDER — LACTATED RINGERS IV SOLN: INTRAVENOUS | Status: DC

## 2018-01-26 MED ORDER — ACETAMINOPHEN 500 MG PO TABS
500.0000 mg | ORAL_TABLET | Freq: Four times a day (QID) | ORAL | Status: AC
  Administered 2018-01-27: 500 mg via ORAL
  Filled 2018-01-26: qty 1

## 2018-01-26 MED ORDER — TRANEXAMIC ACID 1000 MG/10ML IV SOLN
1000.0000 mg | INTRAVENOUS | Status: AC
  Filled 2018-01-26: qty 10

## 2018-01-26 MED ORDER — ALBUMIN HUMAN 5 % IV SOLN
INTRAVENOUS | Status: DC | PRN
  Administered 2018-01-26: 13:00:00 via INTRAVENOUS

## 2018-01-26 MED ORDER — PHENOL 1.4 % MT LIQD: 1.0000 | OROMUCOSAL | Status: DC | PRN

## 2018-01-26 MED ORDER — HYDROCODONE-ACETAMINOPHEN 5-325 MG PO TABS
1.0000 | ORAL_TABLET | ORAL | Status: DC | PRN
  Administered 2018-01-27 – 2018-01-29 (×2): 1 via ORAL
  Filled 2018-01-26 (×2): qty 1

## 2018-01-26 MED ORDER — MENTHOL 3 MG MT LOZG: 1.0000 | LOZENGE | OROMUCOSAL | Status: DC | PRN

## 2018-01-26 MED ORDER — DEXAMETHASONE SODIUM PHOSPHATE 10 MG/ML IJ SOLN
INTRAMUSCULAR | Status: AC
  Filled 2018-01-26: qty 1

## 2018-01-26 SURGICAL SUPPLY — 49 items
BAG DECANTER FOR FLEXI CONT (MISCELLANEOUS) ×3 IMPLANT
CAPT HIP HEMI 2 ×2 IMPLANT
CELLS DAT CNTRL 66122 CELL SVR (MISCELLANEOUS) IMPLANT
COVER SURGICAL LIGHT HANDLE (MISCELLANEOUS) ×3 IMPLANT
DRAPE C-ARM 42X72 X-RAY (DRAPES) ×3 IMPLANT
DRAPE POUCH INSTRU U-SHP 10X18 (DRAPES) ×3 IMPLANT
DRAPE STERI IOBAN 125X83 (DRAPES) ×3 IMPLANT
DRAPE U-SHAPE 47X51 STRL (DRAPES) ×6 IMPLANT
DRSG AQUACEL AG ADV 3.5X10 (GAUZE/BANDAGES/DRESSINGS) ×1 IMPLANT
DRSG MEPILEX BORDER 4X8 (GAUZE/BANDAGES/DRESSINGS) ×2 IMPLANT
DURAPREP 26ML APPLICATOR (WOUND CARE) ×3 IMPLANT
ELECT BLADE 4.0 EZ CLEAN MEGAD (MISCELLANEOUS) ×6
ELECT REM PT RETURN 9FT ADLT (ELECTROSURGICAL) ×3
ELECTRODE BLDE 4.0 EZ CLN MEGD (MISCELLANEOUS) ×1 IMPLANT
ELECTRODE REM PT RTRN 9FT ADLT (ELECTROSURGICAL) ×1 IMPLANT
GLOVE BIOGEL PI IND STRL 7.0 (GLOVE) ×1 IMPLANT
GLOVE BIOGEL PI INDICATOR 7.0 (GLOVE) ×2
GLOVE ECLIPSE 7.0 STRL STRAW (GLOVE) ×3 IMPLANT
GLOVE SKINSENSE NS SZ7.5 (GLOVE) ×2
GLOVE SKINSENSE STRL SZ7.5 (GLOVE) ×1 IMPLANT
GLOVE SURG SYN 7.5  E (GLOVE) ×4
GLOVE SURG SYN 7.5 E (GLOVE) ×2 IMPLANT
GLOVE SURG SYN 7.5 PF PI (GLOVE) ×2 IMPLANT
GOWN SRG XL XLNG 56XLVL 4 (GOWN DISPOSABLE) ×1 IMPLANT
GOWN STRL NON-REIN XL XLG LVL4 (GOWN DISPOSABLE) ×2
GOWN STRL REUS W/ TWL LRG LVL3 (GOWN DISPOSABLE) IMPLANT
GOWN STRL REUS W/TWL LRG LVL3 (GOWN DISPOSABLE) ×4
HANDPIECE INTERPULSE COAX TIP (DISPOSABLE) ×2
HOOD PEEL AWAY FLYTE STAYCOOL (MISCELLANEOUS) ×6 IMPLANT
IV NS IRRIG 3000ML ARTHROMATIC (IV SOLUTION) ×3 IMPLANT
KIT BASIN OR (CUSTOM PROCEDURE TRAY) ×3 IMPLANT
MARKER SKIN DUAL TIP RULER LAB (MISCELLANEOUS) ×3 IMPLANT
PACK TOTAL JOINT (CUSTOM PROCEDURE TRAY) ×3 IMPLANT
PACK UNIVERSAL I (CUSTOM PROCEDURE TRAY) ×3 IMPLANT
RETRACTOR WND ALEXIS 18 MED (MISCELLANEOUS) ×1 IMPLANT
RTRCTR WOUND ALEXIS 18CM MED (MISCELLANEOUS)
SAW OSC TIP CART 19.5X105X1.3 (SAW) ×3 IMPLANT
SET HNDPC FAN SPRY TIP SCT (DISPOSABLE) ×1 IMPLANT
STAPLER VISISTAT 35W (STAPLE) IMPLANT
SUT ETHIBOND 2 V 37 (SUTURE) ×7 IMPLANT
SUT ETHILON 3 0 PS 1 (SUTURE) ×4 IMPLANT
SUT VIC AB 1 CT1 27 (SUTURE) ×4
SUT VIC AB 1 CT1 27XBRD ANBCTR (SUTURE) ×1 IMPLANT
SUT VIC AB 2-0 CT1 27 (SUTURE) ×2
SUT VIC AB 2-0 CT1 TAPERPNT 27 (SUTURE) ×1 IMPLANT
SUT VIC AB 2-0 CTB1 (SUTURE) ×2 IMPLANT
TOWEL OR 17X26 10 PK STRL BLUE (TOWEL DISPOSABLE) ×3 IMPLANT
TRAY CATH 16FR W/PLASTIC CATH (SET/KITS/TRAYS/PACK) ×3 IMPLANT
YANKAUER SUCT BULB TIP NO VENT (SUCTIONS) ×3 IMPLANT

## 2018-01-26 NOTE — H&P (Signed)
H&P update  The surgical history has been reviewed and remains accurate without interval change.  The patient was re-examined and patient's physiologic condition has not changed significantly in the last 30 days. The condition still exists that makes this procedure necessary. The treatment plan remains the same, without new options for care.  No new pharmacological allergies or types of therapy has been initiated that would change the plan or the appropriateness of the plan.  The patient and/or family understand the potential benefits and risks.  Mayra ReelN. Michael Xu, MD 01/26/2018 10:49 AM

## 2018-01-26 NOTE — Transfer of Care (Signed)
Immediate Anesthesia Transfer of Care Note  Patient: Erica Vasquez  Procedure(s) Performed: TOTAL HIP ARTHROPLASTY ANTERIOR APPROACH (Right Hip)  Patient Location: PACU  Anesthesia Type:Spinal  Level of Consciousness: awake, patient cooperative and confused  Airway & Oxygen Therapy: Patient Spontanous Breathing  Post-op Assessment: Report given to RN  Post vital signs: Reviewed  Last Vitals: 118/72, 84, 18, 96% Vitals Value Taken Time  BP 118/72 01/26/2018  1:25 PM  Temp    Pulse 97 01/26/2018  1:26 PM  Resp 6 01/26/2018  1:26 PM  SpO2 96 % 01/26/2018  1:26 PM  Vitals shown include unvalidated device data.  Last Pain:  Vitals:   01/26/18 0605  TempSrc:   PainSc: 2          Complications: No apparent anesthesia complications

## 2018-01-26 NOTE — Anesthesia Postprocedure Evaluation (Signed)
Anesthesia Post Note  Patient: Erica Vasquez  Procedure(s) Performed: TOTAL HIP ARTHROPLASTY ANTERIOR APPROACH (Right Hip)     Patient location during evaluation: PACU Anesthesia Type: MAC Level of consciousness: awake and alert Pain management: pain level controlled Vital Signs Assessment: post-procedure vital signs reviewed and stable Respiratory status: spontaneous breathing and respiratory function stable Cardiovascular status: blood pressure returned to baseline and stable Postop Assessment: spinal receding Anesthetic complications: no    Last Vitals:  Vitals:   01/26/18 1425 01/26/18 1430  BP: (!) 148/70   Pulse: 78 77  Resp: 16 14  Temp:  (!) 36.3 C  SpO2: 93% 98%    Last Pain:  Vitals:   01/26/18 0605  TempSrc:   PainSc: 2                  Deni Lefever DANIEL

## 2018-01-26 NOTE — Anesthesia Procedure Notes (Signed)
Spinal  Patient location during procedure: OR Start time: 01/26/2018 11:43 AM End time: 01/26/2018 11:53 AM Staffing Anesthesiologist: Heather RobertsSinger, Everley Evora, MD Performed: anesthesiologist  Preanesthetic Checklist Completed: patient identified, surgical consent, pre-op evaluation, timeout performed, IV checked, risks and benefits discussed and monitors and equipment checked Spinal Block Patient position: sitting Prep: DuraPrep Patient monitoring: cardiac monitor, continuous pulse ox and blood pressure Approach: right paramedian Location: L3-4 Injection technique: single-shot Needle Needle type: Quincke  Needle gauge: 22 G Needle length: 9 cm Additional Notes Functioning IV was confirmed and monitors were applied. Sterile prep and drape, including hand hygiene and sterile gloves were used. The patient was positioned and the spine was prepped. The skin was anesthetized with lidocaine.  Free flow of clear CSF was obtained prior to injecting local anesthetic into the CSF.  The spinal needle aspirated freely following injection.  The needle was carefully withdrawn.  The patient tolerated the procedure well.

## 2018-01-26 NOTE — Discharge Instructions (Signed)
° ° °  1. Change dressings as needed °2. May shower but keep incisions covered and dry °3. Take lovenox to prevent blood clots °4. Take stool softeners as needed °5. Take pain meds as needed ° °

## 2018-01-26 NOTE — Progress Notes (Signed)
PROGRESS NOTE  Erica Vasquez SXJ:[id number] DOB: 14-May-1930 DOA: 01/25/2018 PCP: Iona Beard, MD  HPI/Recap of past 24 hours: Erica Vasquez is a 84 y.o. female with medical history significant of hypertension, dementia, bilateral blindness, who presents with mechanical fall and right hip pain. Per patient's niece, patient fell when doing routine exercise at the bedside, no LOC/hit head. Pt developed pain in the right hip, which was constant, severe, sharp, nonradiating. In the ED, CT head negative for acute intracranial abnormalities. X-ray of pelvis showed right femoral neck fracture, and also showed 1.51.6 cm of lytic lesion in right greater trochanter. Pt admitted for further management. Orthopedic surgeon, Dr. Erlinda Hong was consulted.  Today, met patient sleeping, easily arousable.  Family at bedside reported patient just got IV narcotics and has been pretty sleepy.  Patient looked comfortable.  For surgery today.  Assessment/Plan: Principal Problem:   Fracture of femoral neck, right, closed ([place]) Active Problems:   Fall   Blindness   Hypokalemia   Essential hypertension   Lytic bone lesion of hip  Fracture of femoral neck, right, closed Pt stable, with pain well controlled X-ray showed fracture as above Orthopedics on board, for surgery today Pain management/DVT ppx/PT/OT per surgery  Mechanical fall Bilateral blindness, family usually assist pt in ambulation Fall precaution PT/OT as per orthopedics  Lytic bone lesion of hip Incidental findings on x-ray, lytic lesion in the left greater trochanter SPEP ordered to r/o MM, pending Follow up as outpt  HTN Uncontrolled likely due to pain Not on meds at home, was started on norvasc 2.5 mg daily IV hydralazine prn   Code Status: Full  Family Communication: Spoke with son  Disposition Plan: Likely rehab after surgery   Consultants:  Orthopedics  Procedures:  None  Antimicrobials:  None  DVT  prophylaxis:  SCDs   Objective: Vitals:   01/25/18 2030 01/25/18 2100 01/25/18 2150 01/26/18 0537  BP: (!) 150/85 (!) 146/90 (!) 181/86 (!) 171/93  Pulse: 99 97 (!) 101 92  Resp: '19 15 20 18  '$ Temp:   98.1 F (36.7 C) 99.5 F (37.5 C)  TempSrc:   Axillary Axillary  SpO2: 99% 100% 100% 100%  Weight:      Height:       No intake or output data in the 24 hours ending 01/26/18 1124 Filed Weights   01/25/18 1438  Weight: 42.6 kg (94 lb)    Exam:   General: NAD, bilateral blindness, usually keeps both eyes closed  Cardiovascular: S1, S2 present  Respiratory: Chest clear to auscultation bilaterally  Abdomen: Soft, nontender, nondistended, bowel sounds present  Musculoskeletal: No pedal edema bilaterally, pain with movement of right hip  Skin: Normal  Psychiatry: Unable to assess   Data Reviewed: CBC: Recent Labs  Lab 01/25/18 1852 01/26/18 0847  WBC 7.8 5.3  NEUTROABS 7.0  --   HGB 12.5 11.3*  HCT 39.8 35.7*  MCV 84.9 84.2  PLT 175 336*   Basic Metabolic Panel: Recent Labs  Lab 01/25/18 1852 01/26/18 0847  NA 139 138  K 3.3* 3.3*  CL 105 105  CO2 25 23  GLUCOSE 135* 120*  BUN 10 6  CREATININE 0.81 0.70  CALCIUM 8.8* 8.4*  MG  --  1.8   GFR: Estimated Creatinine Clearance: 34.6 mL/min (by C-G formula based on SCr of 0.7 mg/dL). Liver Function Tests: Recent Labs  Lab 01/25/18 1852  AST 26  ALT 11*  ALKPHOS 54  BILITOT 1.1  PROT 5.8*  ALBUMIN 3.4*   No results for input(s): LIPASE, AMYLASE in the last 168 hours. No results for input(s): AMMONIA in the last 168 hours. Coagulation Profile: Recent Labs  Lab 01/25/18 2106  INR 1.06   Cardiac Enzymes: No results for input(s): CKTOTAL, CKMB, CKMBINDEX, TROPONINI in the last 168 hours. BNP (last 3 results) No results for input(s): PROBNP in the last 8760 hours. HbA1C: No results for input(s): HGBA1C in the last 72 hours. CBG: No results for input(s): GLUCAP in the last 168 hours. Lipid  Profile: No results for input(s): CHOL, HDL, LDLCALC, TRIG, CHOLHDL, LDLDIRECT in the last 72 hours. Thyroid Function Tests: No results for input(s): TSH, T4TOTAL, FREET4, T3FREE, THYROIDAB in the last 72 hours. Anemia Panel: No results for input(s): VITAMINB12, FOLATE, FERRITIN, TIBC, IRON, RETICCTPCT in the last 72 hours. Urine analysis: No results found for: COLORURINE, APPEARANCEUR, LABSPEC, PHURINE, GLUCOSEU, HGBUR, BILIRUBINUR, KETONESUR, PROTEINUR, UROBILINOGEN, NITRITE, LEUKOCYTESUR Sepsis Labs: '@LABRCNTIP'$ (procalcitonin:4,lacticidven:4)  ) Recent Results (from the past 240 hour(s))  MRSA PCR Screening     Status: None   Collection Time: 01/26/18  1:05 AM  Result Value Ref Range Status   MRSA by PCR NEGATIVE NEGATIVE Final    Comment:        The GeneXpert MRSA Assay (FDA approved for NASAL specimens only), is one component of a comprehensive MRSA colonization surveillance program. It is not intended to diagnose MRSA infection nor to guide or monitor treatment for MRSA infections. Performed at [facility], [place] [address], [place], [place] [address]       Studies: Ct Head Wo Contrast  Result Date: 01/25/2018 CLINICAL DATA:  Pt has [facility] [place] and had a slight bump to her head but there are no bruises or cuts EXAM: CT HEAD WITHOUT CONTRAST TECHNIQUE: Contiguous axial images were obtained from the base of the skull through the vertex without intravenous contrast. COMPARISON:  08/02/2009 FINDINGS: Brain: No evidence of acute infarction, hemorrhage, hydrocephalus, extra-axial collection or mass lesion/mass effect. There is ventricular and sulcal enlargement reflecting generalized atrophy, increased when compared to the prior CT. Vascular: No hyperdense vessel or unexpected calcification. Skull: Normal. Negative for fracture or focal lesion. Sinuses/Orbits: Visualize globes and orbits are unremarkable. Visualized sinuses and mastoid air cells are clear. Other: None.  IMPRESSION: 1. No acute intracranial abnormalities. 2. Atrophy increased when compared to the prior head CT. Electronically Signed   By: Lajean Manes M.D.   On: 01/25/2018 18:38   Dg Hips Bilat With Pelvis Min 5 Views  Result Date: 01/25/2018 CLINICAL DATA:  Status post fall with bilateral hip pain. EXAM: DG HIP (WITH OR WITHOUT PELVIS) 5+V BILAT COMPARISON:  None. FINDINGS: There is a fracture of the right femoral neck. There is no dislocation. There is a 1.5 x 1.6 cm lytic lesion in the left greater trochanter. Degenerative joint changes of the spine are noted. Extensive bowel content is identified in the visualized colon. IMPRESSION: Fracture of the right femoral neck.  There is no dislocation. 1.5 x 1.6 cm lytic lesion in the left greater trochanter. This is nonspecific. Differential diagnosis includes but is not limited to myeloma lesion, metastasis. Clinical correlation is recommended. Electronically Signed   By: Abelardo Diesel M.D.   On: 01/25/2018 17:51    Scheduled Meds: . [MAR Hold] amLODipine  2.5 mg Oral Daily  . povidone-iodine  2 application Topical Once    Continuous Infusions: . sodium chloride 75 mL/hr at 01/25/18 2156  .  ceFAZolin (ANCEF) IV    .  lactated ringers    . tranexamic acid       LOS: 1 day     Alma Friendly, MD [facility]   If 7PM-7AM, please contact night-coverage [email or web address] Password [facility] 01/26/2018, 11:24 AM

## 2018-01-26 NOTE — Op Note (Signed)
TOTAL HIP ARTHROPLASTY ANTERIOR APPROACH  Procedure Note Seward CarolMontez P Tedeschi   [id number]  Pre-op Diagnosis: right hip fracture     Post-op Diagnosis: same   Operative Procedures  1. Prosthetic replacement for femoral neck fracture. CPT [phone]  Personnel  Surgeon(s): Tarry KosXu, Naiping M, MD  ASSIST: Oneal GroutMary Lindsey Stanbery, PA-C; necessary for the timely completion of procedure and due to complexity of procedure.   Anesthesia: spinal  Prosthesis: Depuy Femur: Corail KA 12 Head: 42 mm size: +1.5 Bearing Type: bipolar  Hip Hemiarthroplasty (Anterior Approach) Op Note:  After informed consent was obtained and the operative extremity marked in the holding area, the patient was brought back to the operating room and placed supine on the HANA table. Next, the operative extremity was prepped and draped in normal sterile fashion. Surgical timeout occurred verifying patient identification, surgical site, surgical procedure and administration of antibiotics.  A modified anterior Smith-Peterson approach to the hip was performed, using the interval between tensor fascia lata and sartorius.  Dissection was carried bluntly down onto the anterior hip capsule. The lateral femoral circumflex vessels were identified and coagulated. A capsulotomy was performed and the capsular flaps tagged for later repair.  Fluoroscopy was utilized to prepare for the femoral neck cut. The neck osteotomy was performed. The femoral head was removed and found a 42 mm head was the appropriate fit.    We then turned our attention to the femur.  After placing the femoral hook, the leg was taken to externally rotated, extended and adducted position taking care to perform soft tissue releases to allow for adequate mobilization of the femur. Soft tissue was cleared from the shoulder of the greater trochanter and the hook elevator used to improve exposure of the proximal femur. Sequential broaching performed up to a size 12. Trial neck and  head were placed. The leg was brought back up to neutral and the construct reduced. The position and sizing of components, offset and leg lengths were checked using fluoroscopy. Stability of the construct was checked in extension and external rotation without any subluxation or impingement of prosthesis. We dislocated the prosthesis, dropped the leg back into position, removed trial components, and irrigated copiously. The final stem and head was then placed, the leg brought back up, the system reduced and fluoroscopy used to verify positioning.  We irrigated, obtained hemostasis and closed the capsule using #2 ethibond suture.  The fascia was closed with #1 vicryl plus, the deep fat layer was closed with 0 vicryl, the subcutaneous layers closed with 2.0 Vicryl Plus and the skin closed with staples. A sterile dressing was applied. The patient was awakened in the operating room and taken to recovery in stable condition. All sponge, needle, and instrument counts were correct at the end of the case.   Position: supine  Complications: none.  Time Out: performed   Drains/Packing: none  Estimated blood loss: 200 cc  Returned to Recovery Room: in good condition.   Antibiotics: yes   Mechanical VTE (DVT) Prophylaxis: sequential compression devices, TED thigh-high  Chemical VTE (DVT) Prophylaxis: lovenox  Fluid Replacement: Crystalloid: see anesthesia record  Specimens Removed: 1 to pathology   Sponge and Instrument Count Correct? yes   PACU: portable radiograph - low AP   Admission: inpatient status, start PT & OT POD#1  Plan/RTC: Return in 2 weeks for staple removal. Return in 6 weeks to see MD.  Weight Bearing/Load Lower Extremity: full  Hip precautions: none Suture Removal: 10-14 days  Betadine to incision  twice daily once dressing is removed on POD#7  N. Glee Arvin, MD [facility] [phone] 12:58 PM

## 2018-01-26 NOTE — Progress Notes (Signed)
PHARMACY NOTE:  ANTIMICROBIAL RENAL DOSAGE ADJUSTMENT  Current antimicrobial regimen includes a mismatch between antimicrobial dosage and estimated renal function.  As per policy approved by the Pharmacy & Therapeutics and Medical Executive Committees, the antimicrobial dosage will be adjusted accordingly.  Current antimicrobial dosage:  Cefazolin 2 gm IV q6hrs x 3 doses  Indication: surgical prophylaxis  Renal Function:  Estimated Creatinine Clearance: 34.6 mL/min (by C-G formula based on SCr of 0.75 mg/dL). []      On intermittent HD, scheduled: []      On CRRT    Antimicrobial dosage has been changed to:  Cefazolin 1 gm IV q12h x 2 doses  Additional comments:  Lovenox prophylactic dose also changed from 40 to 30 mg sq q24hrs  Low body weight (42.6 kg) and crcl ~35 ml/min.  Thank you for allowing pharmacy to be a part of this patient's care.  Dennie Fettersgan, Gervis Gaba Donovan, [facility] Hospital[facility]PH  Pager: [phone] 01/26/2018 3:54 PM

## 2018-01-26 NOTE — Plan of Care (Signed)

## 2018-01-26 NOTE — Anesthesia Preprocedure Evaluation (Addendum)
Anesthesia Evaluation  Patient identified by MRN, date of birth, ID band Patient awake    Reviewed: Allergy & Precautions, NPO status , Patient's Chart, lab work & pertinent test results  History of Anesthesia Complications Negative for: history of anesthetic complications  Airway Mallampati: I  TM Distance: >3 FB Neck ROM: Full    Dental  (+) Edentulous Upper, Edentulous Lower, Dental Advisory Given   Pulmonary neg pulmonary ROS,    Pulmonary exam normal        Cardiovascular hypertension, Pt. on medications Normal cardiovascular exam     Neuro/Psych Dementia negative neurological ROS     GI/Hepatic negative GI ROS, Neg liver ROS,   Endo/Other  negative endocrine ROS  Renal/GU negative Renal ROS  negative genitourinary   Musculoskeletal negative musculoskeletal ROS (+)   Abdominal   Peds negative pediatric ROS (+)  Hematology negative hematology ROS (+)   Anesthesia Other Findings   Reproductive/Obstetrics negative OB ROS                           Anesthesia Physical Anesthesia Plan  ASA: III  Anesthesia Plan: MAC and Spinal   Post-op Pain Management:    Induction:   PONV Risk Score and Plan: 2  Airway Management Planned: Natural Airway and Simple Face Mask  Additional Equipment:   Intra-op Plan:   Post-operative Plan:   Informed Consent: I have reviewed the patients History and Physical, chart, labs and discussed the procedure including the risks, benefits and alternatives for the proposed anesthesia with the patient or authorized representative who has indicated his/her understanding and acceptance.   Dental advisory given and Consent reviewed with POA  Plan Discussed with: CRNA, Anesthesiologist and Surgeon  Anesthesia Plan Comments:        Anesthesia Quick Evaluation

## 2018-01-27 ENCOUNTER — Encounter (HOSPITAL_COMMUNITY): Payer: Self-pay | Admitting: Orthopaedic Surgery

## 2018-01-27 DIAGNOSIS — E440 Moderate protein-calorie malnutrition: Secondary | ICD-10-CM

## 2018-01-27 LAB — BASIC METABOLIC PANEL
ANION GAP: 8 (ref 5–15)
BUN: 6 mg/dL (ref 6–20)
CHLORIDE: 108 mmol/L (ref 101–111)
CO2: 23 mmol/L (ref 22–32)
Calcium: 8.4 mg/dL — ABNORMAL LOW (ref 8.9–10.3)
Creatinine, Ser: 0.9 mg/dL (ref 0.44–1.00)
GFR calc Af Amer: >60 mL/min (ref >60)
GFR calc non Af Amer: 57 mL/min — ABNORMAL LOW (ref >60)
GLUCOSE: 108 mg/dL — AB (ref 65–99)
POTASSIUM: 4.1 mmol/L (ref 3.5–5.1)
Sodium: 139 mmol/L (ref 135–145)

## 2018-01-27 LAB — CBC WITH DIFFERENTIAL/PLATELET
BASOS ABS: 0 10*3/uL (ref 0.0–0.1)
Basophils Relative: 0 %
Eosinophils Absolute: 0 10*3/uL (ref 0.0–0.7)
Eosinophils Relative: 1 %
HEMATOCRIT: 32.3 % — AB (ref 36.0–46.0)
Hemoglobin: 10.1 g/dL — ABNORMAL LOW (ref 12.0–15.0)
LYMPHS PCT: 18 %
Lymphs Abs: 0.9 10*3/uL (ref 0.7–4.0)
MCH: 26.4 pg (ref 26.0–34.0)
MCHC: 31.3 g/dL (ref 30.0–36.0)
MCV: 84.6 fL (ref 78.0–100.0)
Monocytes Absolute: 0.4 10*3/uL (ref 0.1–1.0)
Monocytes Relative: 8 %
NEUTROS ABS: 3.8 10*3/uL (ref 1.7–7.7)
Neutrophils Relative %: 73 %
Platelets: 123 10*3/uL — ABNORMAL LOW (ref 150–400)
RBC: 3.82 MIL/uL — AB (ref 3.87–5.11)
RDW: 14.7 % (ref 11.5–15.5)
WBC: 5.2 10*3/uL (ref 4.0–10.5)

## 2018-01-27 LAB — GLUCOSE, CAPILLARY: Glucose-Capillary: 133 mg/dL — ABNORMAL HIGH (ref 65–99)

## 2018-01-27 MED ORDER — BOOST / RESOURCE BREEZE PO LIQD CUSTOM
1.0000 | Freq: Three times a day (TID) | ORAL | Status: DC
  Administered 2018-01-27 – 2018-01-30 (×6): 1 via ORAL

## 2018-01-27 NOTE — Progress Notes (Signed)
PROGRESS NOTE  [name]:[id number] DOB: 08/05/1930 DOA: 01/25/2018 PCP: Iona Beard, MD  HPI/Recap of past 24 hours: Erica Vasquez is a 84 y.o. female with medical history significant of hypertension, dementia, bilateral blindness, who presents with mechanical fall and right hip pain. Per patient's niece, patient fell when doing routine exercise at the bedside, no LOC/hit head. Pt developed pain in the right hip, which was constant, severe, sharp, nonradiating. In the ED, CT head negative for acute intracranial abnormalities. X-ray of pelvis showed right femoral neck fracture, and also showed 1.51.6 cm of lytic lesion in right greater trochanter. Pt admitted for further management. Orthopedic surgeon, Dr. Erlinda Hong was consulted.  Today, met patient sleeping, easily arousable. Patient looked fairly comfortable. Doesn't respond to question asked, just mumbles. Family not around.  Assessment/Plan: Principal Problem:   Fracture of femoral neck, right, closed ([place]) Active Problems:   Fall   Blindness   Hypokalemia   Essential hypertension   Lytic bone lesion of hip   Malnutrition of moderate degree  Fracture of femoral neck, right, closed S/P R hip hemiarthroplasty on 01/26/18  Noted fever of 100.7, likely post op, no leukocytosis Pt stable, with pain well controlled X-ray showed fracture as above Orthopedics on board Pain management/DVT ppx/PT/OT per surgery PT/OT rec SNF  Mechanical fall Bilateral blindness, family usually assist pt in ambulation Fall precaution PT/OT as per orthopedics  Lytic bone lesion of hip Incidental findings on x-ray, lytic lesion in the left greater trochanter SPEP ordered to r/o MM, pending Follow up as outpt  HTN Better control likely due to pain Not on meds at home, was started on norvasc 2.5 mg daily IV hydralazine prn   Code Status: Full  Family Communication: None at bedside  Disposition Plan:  SNF   Consultants:  Orthopedics  Procedures:  None  Antimicrobials:  None  DVT prophylaxis:  SCDs   Objective: Vitals:   01/26/18 2103 01/27/18 0136 01/27/18 0559 01/27/18 1241  BP: (!) 168/91 (!) 150/79 (!) 143/95 117/76  Pulse: (!) 103 98 71 (!) 110  Resp: '16 16 18 12  '$ Temp: 98.3 F (36.8 C) 99.3 F (37.4 C) (!) 100.7 F (38.2 C)   TempSrc: Axillary Axillary Oral   SpO2: 95% 90% 98% 100%  Weight:      Height:        Intake/Output Summary (Last 24 hours) at 01/27/2018 1437 Last data filed at 01/27/2018 1300 Gross per 24 hour  Intake 360 ml  Output 750 ml  Net -390 ml   Filed Weights   01/25/18 1438  Weight: 42.6 kg (94 lb)    Exam:   General: NAD, bilateral blindness, usually keeps both eyes closed  Cardiovascular: S1, S2 present  Respiratory: Chest clear to auscultation bilaterally  Abdomen: Soft, nontender, nondistended, bowel sounds present  Musculoskeletal: No pedal edema bilaterally, pain with movement of right hip  Skin: Normal  Psychiatry: Unable to assess   Data Reviewed: CBC: Recent Labs  Lab 01/25/18 1852 01/26/18 0847 01/26/18 1500 01/27/18 0546  WBC 7.8 5.3 6.2 5.2  NEUTROABS 7.0  --   --  3.8  HGB 12.5 11.3* 11.5* 10.1*  HCT 39.8 35.7* 35.5* 32.3*  MCV 84.9 84.2 86.8 84.6  PLT 175 141* 123* 798*   Basic Metabolic Panel: Recent Labs  Lab 01/25/18 1852 01/26/18 0847 01/26/18 1500 01/27/18 0546  NA 139 138  --  139  K 3.3* 3.3*  --  4.1  CL 105 105  --  108  CO2 25 23  --  23  GLUCOSE 135* 120*  --  108*  BUN 10 6  --  6  CREATININE 0.81 0.70 0.75 0.90  CALCIUM 8.8* 8.4*  --  8.4*  MG  --  1.8  --   --    GFR: Estimated Creatinine Clearance: 30.7 mL/min (by C-G formula based on SCr of 0.9 mg/dL). Liver Function Tests: Recent Labs  Lab 01/25/18 1852  AST 26  ALT 11*  ALKPHOS 54  BILITOT 1.1  PROT 5.8*  ALBUMIN 3.4*   No results for input(s): LIPASE, AMYLASE in the last 168 hours. No results for  input(s): AMMONIA in the last 168 hours. Coagulation Profile: Recent Labs  Lab 01/25/18 2106  INR 1.06   Cardiac Enzymes: No results for input(s): CKTOTAL, CKMB, CKMBINDEX, TROPONINI in the last 168 hours. BNP (last 3 results) No results for input(s): PROBNP in the last 8760 hours. HbA1C: No results for input(s): HGBA1C in the last 72 hours. CBG: Recent Labs  Lab 01/27/18 1241  GLUCAP 133*   Lipid Profile: No results for input(s): CHOL, HDL, LDLCALC, TRIG, CHOLHDL, LDLDIRECT in the last 72 hours. Thyroid Function Tests: No results for input(s): TSH, T4TOTAL, FREET4, T3FREE, THYROIDAB in the last 72 hours. Anemia Panel: No results for input(s): VITAMINB12, FOLATE, FERRITIN, TIBC, IRON, RETICCTPCT in the last 72 hours. Urine analysis: No results found for: COLORURINE, APPEARANCEUR, LABSPEC, PHURINE, GLUCOSEU, HGBUR, BILIRUBINUR, KETONESUR, PROTEINUR, UROBILINOGEN, NITRITE, LEUKOCYTESUR Sepsis Labs: '@LABRCNTIP'$ (procalcitonin:4,lacticidven:4)  ) Recent Results (from the past 240 hour(s))  MRSA PCR Screening     Status: None   Collection Time: 01/26/18  1:05 AM  Result Value Ref Range Status   MRSA by PCR NEGATIVE NEGATIVE Final    Comment:        The GeneXpert MRSA Assay (FDA approved for NASAL specimens only), is one component of a comprehensive MRSA colonization surveillance program. It is not intended to diagnose MRSA infection nor to guide or monitor treatment for MRSA infections. Performed at [facility], [place] [address], [place], [place] [address]       Studies: No results found.  Scheduled Meds: . acetaminophen  500 mg Oral Q6H  . amLODipine  2.5 mg Oral Daily  . docusate sodium  100 mg Oral BID  . enoxaparin (LOVENOX) injection  30 mg Subcutaneous Q24H  . feeding supplement  1 Container Oral TID BM    Continuous Infusions: . sodium chloride 75 mL/hr at 01/25/18 2156  . sodium chloride 125 mL/hr at 01/26/18 2359  . lactated ringers    .  tranexamic acid (CYKLOKAPRON) topical -INTRAOP       LOS: 2 days     Alma Friendly, MD [facility]   If 7PM-7AM, please contact night-coverage [email or web address] Password [facility] 01/27/2018, 2:37 PM

## 2018-01-27 NOTE — Progress Notes (Signed)
Initial Nutrition Assessment  DOCUMENTATION CODES:   Non-severe (moderate) malnutrition in context of chronic illness  INTERVENTION:    Boost Breeze po TID, each supplement provides 250 kcal and 9 grams of protein  NUTRITION DIAGNOSIS:   Moderate Malnutrition related to chronic illness(dementia) as evidenced by moderate fat depletion, moderate muscle depletion  GOAL:   Patient will meet greater than or equal to 90% of their needs  MONITOR:   PO intake, Supplement acceptance, Labs, Skin, Weight trends, I & O's  REASON FOR ASSESSMENT:   Consult Assessment of nutrition requirement/status  ASSESSMENT:   84 yo Female with PMH of dementia, bilateral blindness, who presents with mechanical fall and right hip pain. Per patient's niece, patient fell when doing routine exercise at the bedside, no LOC/hit head. Pt developed pain in the right hip, which was constant, severe, sharp, nonradiating. In the ED, CT head negative for acute intracranial abnormalities. X-ray of pelvis showed right femoral neck fracture.  RD spoke with pt's son (who is also her caregiver) at bedside.  Son reports pt consumed 50% of her breakfast this am. She typically eats breakfast & dinner at home:  Breakfast: pancakes or sausage McGriddle from [employer] Dinner: veggie, starch, meat (they eat a lot at [employer])  Son also states he has Ensure supplements at home but his Mom won't drink them. Pt has poor dentition/missing teeth. Has some chewing difficulty. Son reveals pt eats soft foods. RD offered Boost Breeze nutrition supplement. Son believe pt might like the [employer] flavor.  Son reports pt's weight fluctuates but recently she's had no weight loss. Medications reviewed and include Reglan. Labs reviewed. Bld Glucose 108 (H).  NUTRITION - FOCUSED PHYSICAL EXAM:    Most Recent Value  Orbital Region  Moderate depletion  Upper Arm Region  Moderate depletion  Thoracic and Lumbar Region  Moderate depletion   Buccal Region  Moderate depletion  Temple Region  Moderate depletion  Clavicle Bone Region  Moderate depletion  Clavicle and Acromion Bone Region  Moderate depletion  Scapular Bone Region  Unable to assess  Dorsal Hand  Unable to assess  Patellar Region  Moderate depletion  Anterior Thigh Region  Moderate depletion  Posterior Calf Region  Moderate depletion  Edema (RD Assessment)  None      Diet Order:  Fall precautions Diet regular Room service appropriate? Yes; Fluid consistency: Thin  EDUCATION NEEDS:   No education needs have been identified at this time  Skin:  Skin Assessment: Skin Integrity Issues: Skin Integrity Issues:: Incisions Incisions: hip  Last BM:  N/A  Height:   Ht Readings from Last 1 Encounters:  01/25/18 4\' 11"  (1.499 m)    Weight:   Wt Readings from Last 1 Encounters:  01/25/18 94 lb (42.6 kg)    Ideal Body Weight:  44.6 kg  BMI:  Body mass index is 18.99 kg/m.  Estimated Nutritional Needs:   Kcal:  1200-1400  Protein:  55-70 gm  Fluid:  >/= 1.5 L  Maureen ChattersKatie Zenaya Ulatowski, RD, LDN Pager #: [phone] After-Hours Pager #: [phone]

## 2018-01-27 NOTE — Evaluation (Signed)
Physical Therapy Evaluation Patient Details Name: Erica Vasquez MRN: [id number] DOB: [birth year] Today's Date: 01/27/2018   History of Present Illness  Erica Vasquez is a 84 y.o. female with medical history significant of hypertension, dementia, bilateral blindness, who presents with fall and right hip pain. Pt undwerwent direct anterior THA by Dr Roda Shutters  Clinical Impression  Pt admitted with above diagnosis. Pt currently with functional limitations due to the deficits listed below (see PT Problem List). Pt not following commands today or verbalizing on eval which pt's son reports is a change for her and was a challenge on eval. Pt needed max A for bed mobility. Maintained sitting EOB x8 mins with mod A progressing to min-guard A. Pt seemed anxious and fearful so did not attempt transfer today but may be able to try next visit. Given 15 STE pt's home and current help needed, recommend SNF for rehab.  Pt will benefit from skilled PT to increase their independence and safety with mobility to allow discharge to the venue listed below.       Follow Up Recommendations SNF;Supervision/Assistance - 24 hour    Equipment Recommendations  None recommended by PT    Recommendations for Other Services OT consult     Precautions / Restrictions Precautions Precautions: Fall Precaution Comments: pt is blind as well as having dementia Restrictions Weight Bearing Restrictions: Yes RLE Weight Bearing: Weight bearing as tolerated      Mobility  Bed Mobility Overal bed mobility: Needs Assistance Bed Mobility: Supine to Sit;Sit to Supine     Supine to sit: Max assist Sit to supine: Max assist   General bed mobility comments: pt not resisting mobility but not following commands or really participating either. Max A for pivot to L EOB. Pt moaning with movement. Pivot for return to supine with max A , did not seem as painful.   Transfers                 General transfer comment: did not  attempt to stand today since pt moaning in sitting and trying to keep therapy session from being fearful for her.   Ambulation/Gait             General Gait Details: unable at this point  Stairs            Wheelchair Mobility    Modified Rankin (Stroke Patients Only)       Balance Overall balance assessment: Needs assistance Sitting-balance support: Feet supported;Single extremity supported Sitting balance-Leahy Scale: Poor Sitting balance - Comments: needed mod A with initial sitting, but after hips positioned better, pt able to sit with min-guard A.                                      Pertinent Vitals/Pain Pain Assessment: Faces Faces Pain Scale: Hurts even more Pain Location: R hip Pain Descriptors / Indicators: Operative site guarding;Grimacing;Moaning Pain Intervention(s): Limited activity within patient's tolerance;Monitored during session    Home Living Family/patient expects to be discharged to:: Skilled nursing facility                      Prior Function Level of Independence: Needs assistance   Gait / Transfers Assistance Needed: PTA pt was ambulating with assist from son or other family member. Was also going up and down 15 steps in and out of house daily.   ADL's /  Homemaking Assistance Needed: needed assistance  Comments: pt was never alone PTA.     Hand Dominance        Extremity/Trunk Assessment   Upper Extremity Assessment Upper Extremity Assessment: Overall WFL for tasks assessed    Lower Extremity Assessment Lower Extremity Assessment: RLE deficits/detail;Difficult to assess due to impaired cognition;LLE deficits/detail RLE Deficits / Details: pt able to partially bend R knee up in bed though did not do so on command but on her own. Able to sit with R knee bent at 90 deg.  LLE Deficits / Details: [facility] to be [facility]    Cervical / Trunk Assessment Cervical / Trunk Assessment: Kyphotic  Communication    Communication: Other (comment)(no understandable speech on eval)  Cognition Arousal/Alertness: Awake/alert Behavior During Therapy: Agitated Overall Cognitive Status: Impaired/Different from baseline Area of Impairment: Following commands                               General Comments: pt not following commands on eval and her son reports that she was at home PTA. She was also not speaking to me on eval and apparently she did speak PTA. Pt agitated and grabbing at sheets. Had busy belt on her lap upon my arrival.       General Comments General comments (skin integrity, edema, etc.): from a hip standpoint, pt would really benefit from SNF for rehab. From a dementia and blindness standpoint, would be better for pt to return home and received therapy in her familiar environment. Spoke with her son about this dilemma and he was agreeable to either option but mentioned that they have 15 steps to enter home. Given this, recommend short term SNF then home.     Exercises Total Joint Exercises Long Arc Quad: PROM;Both;10 reps;Seated   Assessment/Plan    PT Assessment Patient needs continued PT services  PT Problem List Decreased strength;Decreased activity tolerance;Decreased balance;Decreased mobility;Decreased cognition;Decreased knowledge of use of DME;Decreased knowledge of precautions;Pain       PT Treatment Interventions DME instruction;Gait training;Functional mobility training;Therapeutic activities;Therapeutic exercise;Balance training;Neuromuscular re-education;Cognitive remediation;Patient/family education    PT Goals (Current goals can be found in the Care Plan section)  Acute Rehab PT Goals Patient Stated Goal: return to PLOF PT Goal Formulation: With family Time For Goal Achievement: 02/10/18 Potential to Achieve Goals: Fair    Frequency Min 3X/week   Barriers to discharge Inaccessible home environment 15 STE    Co-evaluation               AM-PAC PT  "6 Clicks" Daily Activity  Outcome Measure Difficulty turning over in bed (including adjusting bedclothes, sheets and blankets)?: Unable Difficulty moving from lying on back to sitting on the side of the bed? : Unable Difficulty sitting down on and standing up from a chair with arms (e.g., wheelchair, bedside commode, etc,.)?: Unable Help needed moving to and from a bed to chair (including a wheelchair)?: Total Help needed walking in hospital room?: Total Help needed climbing 3-5 steps with a railing? : Total 6 Click Score: 6    End of Session   Activity Tolerance: Patient tolerated treatment well Patient left: in bed;with bed alarm set;with call bell/phone within reach Nurse Communication: Mobility status PT Visit Diagnosis: Unsteadiness on feet (R26.81);Difficulty in walking, not elsewhere classified (R26.2);Pain;Adult, failure to thrive (R62.7);History of falling (Z91.81) Pain - Right/Left: Right Pain - part of body: Hip    Time: [id number] PT Time Calculation (  min) (ACUTE ONLY): 14 min   Charges:   PT Evaluation $PT Eval Moderate Complexity: 1 Mod     PT G Codes:        Lyanne Co, PT  Acute Rehab Services  [phone]   Augusta L Jassmine Vandruff 01/27/2018, 2:22 PM

## 2018-01-27 NOTE — Progress Notes (Signed)
   Subjective:  Patient reports pain as mild.  No events.  Objective:   VITALS:   Vitals:   01/26/18 1444 01/26/18 2103 01/27/18 0136 01/27/18 0559  BP: (!) 151/88 (!) 168/91 (!) 150/79 (!) 143/95  Pulse: 97 (!) 103 98 71  Resp: 16 16 16 18   Temp: 98.7 F (37.1 C) 98.3 F (36.8 C) 99.3 F (37.4 C) (!) 100.7 F (38.2 C)  TempSrc: Oral Axillary Axillary Oral  SpO2: (!) 71% 95% 90% 98%  Weight:      Height:        Neurologically intact Neurovascular intact Sensation intact distally Intact pulses distally Dorsiflexion/Plantar flexion intact Incision: dressing C/D/I and no drainage No cellulitis present Compartment soft   Lab Results  Component Value Date   WBC 5.2 01/27/2018   HGB 10.1 (L) 01/27/2018   HCT 32.3 (L) 01/27/2018   MCV 84.6 01/27/2018   PLT 123 (L) 01/27/2018     Assessment/Plan:  1 Day Post-Op   - Expected postop acute blood loss anemia - will monitor for symptoms - Up with PT/OT - DVT ppx - SCDs, ambulation, lovenox - WBAT operative extremity, no hip precautions - Pain control - Discharge planning - will need SNF  Glee ArvinMichael Xu 01/27/2018, 12:19 PM [phone]

## 2018-01-27 NOTE — NC FL2 (Signed)
[place] MEDICAID FL2 LEVEL OF CARE SCREENING TOOL     IDENTIFICATION  Patient Name: Erica Vasquez Birthdate: 02-Jul-1930 Sex: female Admission Date (Current Location): 01/25/2018  [facility] [place]   Facility and Address:  [facility] [place], [address], [place], [place] [address]      Provider Number: [id number]  Attending Physician Name and Address:  Briant CedarEzenduka, Nkeiruka J, MD  Relative Name and Phone Number:  Aggie CosierCrystal, niece, [phone]    Current Level of Care: Hospital Recommended Level of Care: Skilled Nursing Facility Prior Approval Number:    Date Approved/Denied:   PASRR Number: [id number] [id number]  Discharge Plan: ICF    Current Diagnoses: Patient Active Problem List   Diagnosis Date Noted  . Malnutrition of moderate degree 01/27/2018  . Fall 01/25/2018  . Fracture of femoral neck, right, closed (HCC) 01/25/2018  . Blindness 01/25/2018  . Hypokalemia 01/25/2018  . Lytic bone lesion of hip 01/25/2018  . Essential hypertension     Orientation RESPIRATION BLADDER Height & Weight     (Disoriented x4)  Normal Continent, Indwelling catheter Weight: 42.6 kg (94 lb) Height:  4\' 11"  (149.9 cm)  BEHAVIORAL SYMPTOMS/MOOD NEUROLOGICAL BOWEL NUTRITION STATUS      Continent Diet(Please see DC Summary)  AMBULATORY STATUS COMMUNICATION OF NEEDS Skin   Extensive Assist Verbally Surgical wounds(Closed incision on hip)                       Personal Care Assistance Level of Assistance  Bathing, Feeding, Dressing Bathing Assistance: Maximum assistance Feeding assistance: Limited assistance Dressing Assistance: Maximum assistance     Functional Limitations Info  Sight Sight Info: Impaired        SPECIAL CARE FACTORS FREQUENCY  PT (By licensed PT), OT (By licensed OT)     PT Frequency: 5x/week OT Frequency: 3x/week            Contractures      Additional Factors Info  Code Status, Allergies Code Status  Info: Full Allergies Info: Bimatoprost, Brimonidine, Timolol           Current Medications (01/27/2018):  This is the current hospital active medication list Current Facility-Administered Medications  Medication Dose Route Frequency Provider Last Rate Last Dose  . acetaminophen (TYLENOL) tablet 325-650 mg  325-650 mg Oral Q6H PRN Tarry KosXu, Naiping M, MD      . acetaminophen (TYLENOL) tablet 500 mg  500 mg Oral Q6H Tarry KosXu, Naiping M, MD   500 mg at 01/27/18 1106  . alum & mag hydroxide-simeth (MAALOX/MYLANTA) 200-200-20 MG/5ML suspension 30 mL  30 mL Oral Q4H PRN Tarry KosXu, Naiping M, MD      . amLODipine (NORVASC) tablet 2.5 mg  2.5 mg Oral Daily Tarry KosXu, Naiping M, MD   2.5 mg at 01/27/18 1106  . docusate sodium (COLACE) capsule 100 mg  100 mg Oral BID Tarry KosXu, Naiping M, MD      . enoxaparin (LOVENOX) injection 30 mg  30 mg Subcutaneous Q24H Tarry KosXu, Naiping M, MD   30 mg at 01/27/18 1105  . feeding supplement (BOOST / RESOURCE BREEZE) liquid 1 Container  1 Container Oral TID BM Briant CedarEzenduka, Nkeiruka J, MD      . hydrALAZINE (APRESOLINE) injection 5 mg  5 mg Intravenous Q2H PRN Tarry KosXu, Naiping M, MD      . HYDROcodone-acetaminophen (NORCO) 7.5-325 MG per tablet 1-2 tablet  1-2 tablet Oral Q4H PRN Tarry KosXu, Naiping M, MD      .  HYDROcodone-acetaminophen (NORCO/VICODIN) 5-325 MG per tablet 1-2 tablet  1-2 tablet Oral Q4H PRN Tarry Kos, MD      . lactated ringers infusion   Intravenous Continuous Tarry Kos, MD      . menthol-cetylpyridinium (CEPACOL) lozenge 3 mg  1 lozenge Oral PRN Tarry Kos, MD       Or  . phenol (CHLORASEPTIC) mouth spray 1 spray  1 spray Mouth/Throat PRN Tarry Kos, MD      . methocarbamol (ROBAXIN) tablet 500 mg  500 mg Oral Q8H PRN Tarry Kos, MD      . metoCLOPramide (REGLAN) tablet 5-10 mg  5-10 mg Oral Q8H PRN Tarry Kos, MD       Or  . metoCLOPramide (REGLAN) injection 5-10 mg  5-10 mg Intravenous Q8H PRN Tarry Kos, MD      . morphine 2 MG/ML injection 0.5-1 mg  0.5-1 mg Intravenous  Q2H PRN Tarry Kos, MD   1 mg at 01/27/18 0545  . ondansetron (ZOFRAN) tablet 4 mg  4 mg Oral Q6H PRN Tarry Kos, MD       Or  . ondansetron [facility]) injection 4 mg  4 mg Intravenous Q6H PRN Tarry Kos, MD      . senna-docusate (Senokot-S) tablet 1 tablet  1 tablet Oral QHS PRN Tarry Kos, MD      . tranexamic acid (CYKLOKAPRON) 2,000 mg in sodium chloride 0.9 % 50 mL Topical Application  2,000 mg Topical Once Tarry Kos, MD      . zolpidem (AMBIEN) tablet 5 mg  5 mg Oral QHS PRN Tarry Kos, MD         Discharge Medications: Please see discharge summary for a list of discharge medications.  Relevant Imaging Results:  Relevant Lab Results:   Additional Information SSN: [id number] [place], [place]

## 2018-01-28 LAB — CBC WITH DIFFERENTIAL/PLATELET
BASOS ABS: 0 10*3/uL (ref 0.0–0.1)
Basophils Relative: 1 %
Eosinophils Absolute: 0.1 10*3/uL (ref 0.0–0.7)
Eosinophils Relative: 3 %
HEMATOCRIT: 30 % — AB (ref 36.0–46.0)
HEMOGLOBIN: 9.7 g/dL — AB (ref 12.0–15.0)
LYMPHS PCT: 21 %
Lymphs Abs: 1 10*3/uL (ref 0.7–4.0)
MCH: 27.2 pg (ref 26.0–34.0)
MCHC: 32.3 g/dL (ref 30.0–36.0)
MCV: 84 fL (ref 78.0–100.0)
Monocytes Absolute: 0.4 10*3/uL (ref 0.1–1.0)
Monocytes Relative: 8 %
NEUTROS ABS: 3.2 10*3/uL (ref 1.7–7.7)
Neutrophils Relative %: 67 %
Platelets: 112 10*3/uL — ABNORMAL LOW (ref 150–400)
RBC: 3.57 MIL/uL — AB (ref 3.87–5.11)
RDW: 15 % (ref 11.5–15.5)
WBC: 4.8 10*3/uL (ref 4.0–10.5)

## 2018-01-28 LAB — BASIC METABOLIC PANEL
ANION GAP: 8 (ref 5–15)
BUN: 9 mg/dL (ref 6–20)
CHLORIDE: 108 mmol/L (ref 101–111)
CO2: 21 mmol/L — AB (ref 22–32)
Calcium: 8 mg/dL — ABNORMAL LOW (ref 8.9–10.3)
Creatinine, Ser: 0.72 mg/dL (ref 0.44–1.00)
GFR calc Af Amer: >60 mL/min (ref >60)
GLUCOSE: 104 mg/dL — AB (ref 65–99)
POTASSIUM: 3.7 mmol/L (ref 3.5–5.1)
Sodium: 137 mmol/L (ref 135–145)

## 2018-01-28 MED ORDER — DOCUSATE SODIUM 50 MG/5ML PO LIQD
100.0000 mg | Freq: Two times a day (BID) | ORAL | Status: DC
  Administered 2018-01-28 – 2018-01-30 (×2): 100 mg via ORAL
  Filled 2018-01-28 (×6): qty 10

## 2018-01-28 NOTE — Clinical Social Work Note (Signed)
Clinical Social Work Assessment  Patient Details  Name: Erica Vasquez MRN: [id number] Date of Birth: 10-29-1929  Date of referral:  01/28/18               Reason for consult:  Facility Placement                Permission sought to share information with:  Facility Medical sales representativeContact Representative, Family Supports Permission granted to share information::  (pt disoriented)  Name::     Erica Vasquez, Warden/rangerCrystal  Agency::  SNF  Relationship::  son, niece  SolicitorContact Information:     Housing/Transportation Living arrangements for the past 2 months:  Single Family Home Source of Information:  Adult Children Patient Interpreter Needed:  None Criminal Activity/Legal Involvement Pertinent to Current Situation/Hospitalization:  No - Comment as needed Significant Relationships:  Adult Children, Other Family Members Lives with:  Adult Children Do you feel safe going back to the place where you live?    Need for family participation in patient care:  Yes (Comment)(help with ADLs at baseline)  Care giving concerns:  Pt lives at home with help from son and from niece for daily living activities.  Pt now with increased impairment and unsure if pt can be cared for sufficiently at home.   Social Worker assessment / plan:  CSW spoke with pt son, Bethann BerkshireJohnny, concerning PT recommendation for SNF>  Explained SNF and SNF referral process.  Employment status:  Retired Database administratornsurance information:  Managed Medicare PT Recommendations:  Skilled Nursing Facility Information / Referral to community resources:  Skilled Nursing Facility  Patient/Family's Response to care:  Son unsure what they want to do- feel like pt might do better at home given dementia and blindness but understand she is needing more help than normal and would benefit from higher levels of therapy.  Patient/Family's Understanding of and Emotional Response to Diagnosis, Current Treatment, and Prognosis:  Son very involved with pt care and has good understanding of  current condition.  Wants pt to be as close to healed as possible prior to leaving the hospital- very optimistic about her recovery.  Emotional Assessment Appearance:  Appears stated age Attitude/Demeanor/Rapport:    Affect (typically observed):  Appropriate Orientation:  Oriented to Self, Oriented to Place, Oriented to  Time, Oriented to Situation Alcohol / Substance use:    Psych involvement (Current and /or in the community):  No (Comment)  Discharge Needs  Concerns to be addressed:  Care Coordination Readmission within the last 30 days:  No Current discharge risk:  Physical Impairment Barriers to Discharge:  Continued Medical Work up   Burna SisUris, Suraj Ramdass H, LCSW 01/28/2018, 3:53 PM

## 2018-01-28 NOTE — Progress Notes (Signed)
Physical Therapy Treatment Patient Details Name: Erica Vasquez MRN: [id number] DOB: [birth year] Today's Date: 01/28/2018    History of Present Illness Erica Vasquez is a 84 y.o. female with medical history significant of hypertension, dementia, bilateral blindness, who presents with fall and right hip pain. Pt undwerwent direct anterior THA by Dr Roda ShuttersXu    PT Comments    [facility] chair with +2 max A. Pt mildly more interactive today with son present. Still difficult for her to participate though with visual and cognitive deficits. Continue to recommend SNF before going home. PT will continue to follow.    Follow Up Recommendations  SNF;Supervision/Assistance - 24 hour     Equipment Recommendations  None recommended by PT    Recommendations for Other Services OT consult     Precautions / Restrictions Precautions Precautions: Fall Precaution Comments: pt is blind as well as having dementia Restrictions Weight Bearing Restrictions: No RLE Weight Bearing: Weight bearing as tolerated    Mobility  Bed Mobility Overal bed mobility: Needs Assistance Bed Mobility: Supine to Sit     Supine to sit: Max assist;+2 for physical assistance     General bed mobility comments: pt pivoted to R side of bed with use of pad. +2 max A due to posterior lean from pt  Transfers Overall transfer level: Needs assistance Equipment used: None Transfers: Sit to/from [employer];Stand Pivot Transfers Sit to Stand: +2 physical assistance;Max assist Stand pivot transfers: +2 physical assistance;Max assist       General transfer comment: stood from bed with +2 max A. Then [facility] to pt having [facility]. [facility] for [employer]. Max A +2 for each sit to stand but mod A +2 to maintain standing once up.   Ambulation/Gait             General Gait Details: unable at this point   Stairs            Wheelchair Mobility    Modified Rankin  (Stroke Patients Only)       Balance Overall balance assessment: Needs assistance Sitting-balance support: Feet supported;Single extremity supported Sitting balance-Leahy Scale: Poor Sitting balance - Comments: strong posterior lean today, needed consistent mod A Postural control: Posterior lean Standing balance support: Bilateral upper extremity supported Standing balance-Leahy Scale: Zero Standing balance comment: mod A +2 to maintain standing with UE support                            Cognition Arousal/Alertness: Awake/alert Behavior During Therapy: WFL for tasks assessed/performed Overall Cognitive Status: History of cognitive impairments - at baseline Area of Impairment: Following commands                               General Comments: pt's son reports that she is close to her baseline from a cognitive standpoint       Exercises      General Comments General comments (skin integrity, edema, etc.): pt's son and niece still trying to decide if SNF or home will be a better plan for her. Son does not like to pt in pain and leaves room. Continue to recommend ST SNF before home      Pertinent Vitals/Pain Pain Assessment: Faces Faces Pain Scale: Hurts even more Pain Location: R hip Pain Descriptors / Indicators: Operative site guarding;Grimacing;Moaning  Pain Intervention(s): Limited activity within patient's tolerance;Monitored during session    Home Living                      Prior Function            PT Goals (current goals can now be found in the care plan section) Acute Rehab PT Goals Patient Stated Goal: return to PLOF (son's goal) PT Goal Formulation: With family Time For Goal Achievement: 02/10/18 Potential to Achieve Goals: Fair Progress towards PT goals: Progressing toward goals    Frequency    Min 3X/week      PT Plan Current plan remains appropriate    Co-evaluation              AM-PAC PT "6 Clicks"  Daily Activity  Outcome Measure  Difficulty turning over in bed (including adjusting bedclothes, sheets and blankets)?: Unable Difficulty moving from lying on back to sitting on the side of the bed? : Unable Difficulty sitting down on and standing up from a chair with arms (e.g., wheelchair, bedside commode, etc,.)?: Unable Help needed moving to and from a bed to chair (including a wheelchair)?: Total Help needed walking in hospital room?: Total Help needed climbing 3-5 steps with a railing? : Total 6 Click Score: 6    End of Session   Activity Tolerance: Patient tolerated treatment well Patient left: with call bell/phone within reach;in chair;with family/visitor present Nurse Communication: Mobility status PT Visit Diagnosis: Unsteadiness on feet (R26.81);Difficulty in walking, not elsewhere classified (R26.2);Pain;Adult, failure to thrive (R62.7);History of falling (Z91.81) Pain - Right/Left: Right Pain - part of body: Hip     Time: 1110-1149 PT Time Calculation (min) (ACUTE ONLY): 39 min  Charges:  $Therapeutic Activity: 38-52 mins                    G Codes:       Lyanne Co, PT  Acute Rehab Services  [phone]    Lawana Chambers Janalee Grobe 01/28/2018, 1:59 PM

## 2018-01-28 NOTE — Plan of Care (Signed)

## 2018-01-28 NOTE — Progress Notes (Signed)
CSW spoke with pt son regarding SNF recommendation.  Son will speak with his cousin regarding plan- unsure if wants to take back home with home services vs try SNF.  CSW provided pt son with list of offers to consider in case interested in SNF.  Burna SisJenna H. Mica Ramdass, LCSW Clinical Social Worker [phone]

## 2018-01-28 NOTE — Progress Notes (Signed)
Subjective: 2 Days Post-Op Procedure(s) (LRB): TOTAL HIP ARTHROPLASTY ANTERIOR APPROACH (Right) Patient reports pain as mild.  Enjoying her breakfast this am.    Objective: Vital signs in last 24 hours: Temp:  [97.9 F (36.6 C)] 97.9 F (36.6 C) (04/09 0500) Pulse Rate:  [105-113] 105 (04/09 0500) Resp:  [12-18] 17 (04/09 0500) BP: (117-156)/(76-89) 149/89 (04/09 0500) SpO2:  [94 %-100 %] 94 % (04/09 0500)  Intake/Output from previous day: 04/08 0701 - 04/09 0700 In: 240 [P.O.:240] Out: 800 [Urine:800] Intake/Output this shift: No intake/output data recorded.  Recent Labs    01/25/18 1852 01/26/18 0847 01/26/18 1500 01/27/18 0546 01/28/18 0540  HGB 12.5 11.3* 11.5* 10.1* 9.7*   Recent Labs    01/27/18 0546 01/28/18 0540  WBC 5.2 4.8  RBC 3.82* 3.57*  HCT 32.3* 30.0*  PLT 123* PENDING   Recent Labs    01/27/18 0546 01/28/18 0540  NA 139 137  K 4.1 3.7  CL 108 108  CO2 23 21*  BUN 6 9  CREATININE 0.90 0.72  GLUCOSE 108* 104*  CALCIUM 8.4* 8.0*   Recent Labs    01/25/18 2106  INR 1.06    Neurovascular intact Sensation intact distally Intact pulses distally Dorsiflexion/Plantar flexion intact Incision: dressing C/D/I No cellulitis present Compartment soft   Assessment/Plan: 2 Days Post-Op Procedure(s) (LRB): TOTAL HIP ARTHROPLASTY ANTERIOR APPROACH (Right) Advance diet Up with therapy  WBAT RLE Continue plan per medicine    Cristie HemMary L Stanbery 01/28/2018, 8:12 AM

## 2018-01-28 NOTE — Progress Notes (Signed)
PROGRESS NOTE  Erica Vasquez TGG:[id number] DOB: 04-16-1930 DOA: 01/25/2018 PCP: Iona Beard, MD  HPI/Recap of past 24 hours: Erica Vasquez is a 84 y.o. female with medical history significant of hypertension, dementia, bilateral blindness, who presents with mechanical fall and right hip pain. Per patient's niece, patient fell when doing routine exercise at the bedside, no LOC/hit head. Pt developed pain in the right hip, which was constant, severe, sharp, nonradiating. In the ED, CT head negative for acute intracranial abnormalities. X-ray of pelvis showed right femoral neck fracture, and also showed 1.51.6 cm of lytic lesion in right greater trochanter. Pt admitted for further management. Orthopedic surgeon, Dr. Erlinda Hong was consulted.  Today, met patient eating breakfast. Doesn't respond to question asked, just mumbles. Son at bedside  Assessment/Plan: Principal Problem:   Fracture of femoral neck, right, closed ([place]) Active Problems:   Fall   Blindness   Hypokalemia   Essential hypertension   Lytic bone lesion of hip   Malnutrition of moderate degree  Fracture of femoral neck, right, closed S/P R hip hemiarthroplasty on 01/26/18  Pt stable, with pain well controlled X-ray showed fracture as above Orthopedics on board Pain management/DVT ppx/PT/OT per surgery PT/OT rec SNF  Mechanical fall Bilateral blindness, family usually assist pt in ambulation Fall precaution PT/OT as per orthopedics  Lytic bone lesion of hip Incidental findings on x-ray, lytic lesion in the left greater trochanter SPEP ordered to r/o MM, pending Follow up as outpt  HTN Better control likely due to pain Not on meds at home, was started on norvasc 2.5 mg daily, consider increasing  IV hydralazine prn   Code Status: Full  Family Communication: Son at bedside  Disposition Plan: SNF   Consultants:  Orthopedics  Procedures:  None  Antimicrobials:  None  DVT prophylaxis:  Enoxaparin   Objective: Vitals:   01/27/18 1241 01/27/18 2024 01/28/18 0500 01/28/18 1503  BP: 117/76 (!) 156/85 (!) 149/89 (!) 156/88  Pulse: (!) 110 (!) 113 (!) 105 (!) 130  Resp: _0 Temp:   97.9 F (36.6 C) 98.9 F (37.2 C)  TempSrc:   Axillary Oral  SpO2: 100% 100% 94% 91%  Weight:      Height:        Intake/Output Summary (Last 24 hours) at 01/28/2018 1625 Last data filed at 01/28/2018 1355 Gross per 24 hour  Intake 360 ml  Output 501 ml  Net -141 ml   Filed Weights   01/25/18 1438  Weight: 42.6 kg (94 lb)    Exam:   General: NAD, bilateral blindness, usually keeps both eyes closed  Cardiovascular: S1, S2 present  Respiratory: Chest clear to auscultation bilaterally  Abdomen: Soft, nontender, nondistended, bowel sounds present  Musculoskeletal: No pedal edema bilaterally, R hip incision c/d/i  Skin: Normal  Psychiatry: Unable to assess   Data Reviewed: CBC: Recent Labs  Lab 01/25/18 1852 01/26/18 0847 01/26/18 1500 01/27/18 0546 01/28/18 0540  WBC 7.8 5.3 6.2 5.2 4.8  NEUTROABS 7.0  --   --  3.8 3.2  HGB 12.5 11.3* 11.5* 10.1* 9.7*  HCT 39.8 35.7* 35.5* 32.3* 30.0*  MCV 84.9 84.2 86.8 84.6 84.0  PLT 175 141* 123* 123* 703*   Basic Metabolic Panel: Recent Labs  Lab 01/25/18 1852 01/26/18 0847 01/26/18 1500 01/27/18 0546 01/28/18 0540  NA 139 138  --  139 137  K 3.3* 3.3*  --  4.1 3.7  CL 105 105  --  108 108  CO2 25 23  --  23 21*  GLUCOSE 135* 120*  --  108* 104*  BUN 10 6  --  6 9  CREATININE 0.81 0.70 0.75 0.90 0.72  CALCIUM 8.8* 8.4*  --  8.4* 8.0*  MG  --  1.8  --   --   --    GFR: Estimated Creatinine Clearance: 34.6 mL/min (by C-G formula based on SCr of 0.72 mg/dL). Liver Function Tests: Recent Labs  Lab 01/25/18 1852  AST 26  ALT 11*  ALKPHOS 54  BILITOT 1.1  PROT 5.8*  ALBUMIN 3.4*   No results for input(s): LIPASE, AMYLASE in the last 168 hours. No results for input(s): AMMONIA in the last 168  hours. Coagulation Profile: Recent Labs  Lab 01/25/18 2106  INR 1.06   Cardiac Enzymes: No results for input(s): CKTOTAL, CKMB, CKMBINDEX, TROPONINI in the last 168 hours. BNP (last 3 results) No results for input(s): PROBNP in the last 8760 hours. HbA1C: No results for input(s): HGBA1C in the last 72 hours. CBG: Recent Labs  Lab 01/27/18 1241  GLUCAP 133*   Lipid Profile: No results for input(s): CHOL, HDL, LDLCALC, TRIG, CHOLHDL, LDLDIRECT in the last 72 hours. Thyroid Function Tests: No results for input(s): TSH, T4TOTAL, FREET4, T3FREE, THYROIDAB in the last 72 hours. Anemia Panel: No results for input(s): VITAMINB12, FOLATE, FERRITIN, TIBC, IRON, RETICCTPCT in the last 72 hours. Urine analysis: No results found for: COLORURINE, APPEARANCEUR, [place], [place], GLUCOSEU, HGBUR, BILIRUBINUR, KETONESUR, PROTEINUR, UROBILINOGEN, NITRITE, LEUKOCYTESUR Sepsis Labs: _0 (procalcitonin:4,lacticidven:4)  ) Recent Results (from the past 240 hour(s))  MRSA PCR Screening     Status: None   Collection Time: 01/26/18  1:05 AM  Result Value Ref Range Status   MRSA by PCR NEGATIVE NEGATIVE Final    Comment:        The GeneXpert MRSA Assay (FDA approved for NASAL specimens only), is one component of a comprehensive MRSA colonization surveillance program. It is not intended to diagnose MRSA infection nor to guide or monitor treatment for MRSA infections. Performed at [facility], [place] [address] [place], [place] [address]       Studies: No results found.  Scheduled Meds: . amLODipine  2.5 mg Oral Daily  . docusate  100 mg Oral BID  . enoxaparin (LOVENOX) injection  30 mg Subcutaneous Q24H  . feeding supplement  1 Container Oral TID BM    Continuous Infusions: . lactated ringers    . tranexamic acid (CYKLOKAPRON) topical -INTRAOP       LOS: 3 days     Alma Friendly, MD [facility]   If 7PM-7AM, please contact  night-coverage [email or web address] Password [facility] 01/28/2018, 4:25 PM

## 2018-01-29 ENCOUNTER — Inpatient Hospital Stay (HOSPITAL_COMMUNITY): Payer: Medicare Other

## 2018-01-29 DIAGNOSIS — W19XXXA Unspecified fall, initial encounter: Secondary | ICD-10-CM

## 2018-01-29 DIAGNOSIS — I10 Essential (primary) hypertension: Secondary | ICD-10-CM

## 2018-01-29 DIAGNOSIS — H547 Unspecified visual loss: Secondary | ICD-10-CM

## 2018-01-29 DIAGNOSIS — S72001A Fracture of unspecified part of neck of right femur, initial encounter for closed fracture: Principal | ICD-10-CM

## 2018-01-29 LAB — CBC WITH DIFFERENTIAL/PLATELET
Basophils Absolute: 0 10*3/uL (ref 0.0–0.1)
Basophils Relative: 1 %
Eosinophils Absolute: 0.1 10*3/uL (ref 0.0–0.7)
Eosinophils Relative: 3 %
HEMATOCRIT: 29.3 % — AB (ref 36.0–46.0)
HEMOGLOBIN: 9.6 g/dL — AB (ref 12.0–15.0)
LYMPHS ABS: 1.1 10*3/uL (ref 0.7–4.0)
LYMPHS PCT: 26 %
MCH: 27.6 pg (ref 26.0–34.0)
MCHC: 32.8 g/dL (ref 30.0–36.0)
MCV: 84.2 fL (ref 78.0–100.0)
Monocytes Absolute: 0.3 10*3/uL (ref 0.1–1.0)
Monocytes Relative: 7 %
NEUTROS ABS: 2.7 10*3/uL (ref 1.7–7.7)
NEUTROS PCT: 63 %
Platelets: 127 10*3/uL — ABNORMAL LOW (ref 150–400)
RBC: 3.48 MIL/uL — ABNORMAL LOW (ref 3.87–5.11)
RDW: 14.8 % (ref 11.5–15.5)
WBC: 4.2 10*3/uL (ref 4.0–10.5)

## 2018-01-29 LAB — BASIC METABOLIC PANEL
Anion gap: 10 (ref 5–15)
BUN: 5 mg/dL — AB (ref 6–20)
CHLORIDE: 105 mmol/L (ref 101–111)
CO2: 24 mmol/L (ref 22–32)
Calcium: 8.2 mg/dL — ABNORMAL LOW (ref 8.9–10.3)
Creatinine, Ser: 0.71 mg/dL (ref 0.44–1.00)
GFR calc non Af Amer: >60 mL/min (ref >60)
Glucose, Bld: 107 mg/dL — ABNORMAL HIGH (ref 65–99)
Potassium: 3.2 mmol/L — ABNORMAL LOW (ref 3.5–5.1)
Sodium: 139 mmol/L (ref 135–145)

## 2018-01-29 MED ORDER — AMLODIPINE BESYLATE 2.5 MG PO TABS: 2.5000 mg | ORAL_TABLET | Freq: Every day | ORAL | 0 refills | Status: AC

## 2018-01-29 MED ORDER — DOCUSATE SODIUM 50 MG/5ML PO LIQD: 100.0000 mg | Freq: Two times a day (BID) | ORAL | 0 refills | Status: DC

## 2018-01-29 MED ORDER — GADOBENATE DIMEGLUMINE 529 MG/ML IV SOLN
8.0000 mL | Freq: Once | INTRAVENOUS | Status: AC | PRN
  Administered 2018-01-29: 8 mL via INTRAVENOUS

## 2018-01-29 NOTE — Care Management Important Message (Signed)
Important Message  Patient Details  Name: Erica Vasquez MRN: [id number] Date of Birth: 1930/07/15   Medicare Important Message Given:  Yes    Pier Laux 01/29/2018, 1:53 PM

## 2018-01-29 NOTE — Discharge Summary (Addendum)
Physician Discharge Summary  Erica Vasquez:[id number] DOB: April 26, 1930 DOA: 01/25/2018  PCP: Mirna Mires, MD  Admit date: 01/25/2018 Discharge date: 01/29/2018  Admitted From: Home  Disposition:  SNF  Recommendations for Outpatient Follow-up and new medication changes:  1. Follow up with PCP in 1- week 2. Patient will be discharged on enoxaparin for DVT prophylaxis 3. Pain control with oxycodone/acetaminophen 4. Weightbearing as tolerated 5. Patient placed on amlodipine for blood pressure control   Home Health: Yes Equipment/Devices: Hospital bed, bedside commode, walker.   Discharge Condition: stable CODE STATUS: full  Diet recommendation: Heart healthy  Brief/Interim Summary: 84 year old female who presented with right hip pain after a mechanical fall.  Patient does have a significant past medical history for hypertension, dementia, and bilateral blindness.  Patient fell from her  own height while doing routine exercise.  No head trauma or loss of consciousness.  Had significant right hip pain after the event.  On the initial physical examination blood pressure 155/78, heart rate 98, respiratory rate 18, oxygen saturation 99%.  Moist mucous membranes, lungs clear to auscultation bilaterally, heart S1 and S2 present and rhythmic, abdomen soft  and nontender, no lower extremity edema.  Sodium 139, potassium 3.8, chloride 105, bicarb 25, glucose 135, BUN 10, creatinine 0.81, white count 7.8, hemoglobin 12.5, hematocrit 39.8, platelets 175.  Head CT with no acute changes.  Hip films with fracture of the right femoral neck, 1.5 x 1.6 cm lytic lesion on the left greater trochanter.   Patient was admitted to the hospital with a working diagnosis of right femoral neck fracture.  1.  Right hip fracture.  Patient was admitted to the medical ward, she received analgesia and DVT prophylaxis.  Patient was seen by orthopedic service and underwent total hip arthroplasty, anterior approach with no  major complications.  Patient was seen by physical therapy, recommendations to continue physical therapy at a skilled nursing facility.    2.  Hypertension.  Blood pressures remain well controlled on amlodipine 2.5 mg daily.  3. Lytic bone lesion on left trochanter.  Incidental findings.  Pending serum protein electrophoresis.   4.  Dementia.  Patient poorly reactive, no significant agitation.  5.  Moderate malnutrition calorie protein.  Patient was seen by dietary services, nutritional supplements added.  Patient's family have decided to take patient home, they declined skilled nursing facility, elderly patient with blindness and  dementia, there is a risk of worsening mentation at the facility.  Home health services have been arranged, hospital bed, bedside commode and rolling walker.  Discharge Diagnoses:  Principal Problem:   Fracture of femoral neck, right, closed ([facility]) Active Problems:   Fall   Blindness   Hypokalemia   Essential hypertension   Lytic bone lesion of hip   Malnutrition of moderate degree    Discharge Instructions  Discharge Instructions    Weight bearing as tolerated   Complete by:  As directed      Allergies as of 01/29/2018      Reactions   Bimatoprost Itching   Brimonidine Itching   Timolol Itching      Medication List    TAKE these medications   amLODipine 2.5 MG tablet Commonly known as:  NORVASC Take 1 tablet (2.5 mg total) by mouth daily. Start taking on:  01/30/2018   docusate 50 MG/5ML liquid Commonly known as:  COLACE Take 10 mLs (100 mg total) by mouth 2 (two) times daily.   enoxaparin 40 MG/0.4ML injection Commonly known as:  LOVENOX  Inject 0.4 mLs (40 mg total) into the skin daily.   oxyCODONE-acetaminophen 5-325 MG tablet Commonly known as:  PERCOCET Take 1-2 tablets by mouth every 4 (four) hours as needed for severe pain.            Discharge Care Instructions  (From admission, onward)        Start     Ordered    01/26/18 0000  Weight bearing as tolerated     01/26/18 1301     Follow-up Information    Tarry Kos, MD In 2 weeks.   Specialty:  Orthopedic Surgery Why:  For suture removal, For wound re-check Contact information: [address] [place] [address] [phone]          Allergies  Allergen Reactions  . Bimatoprost Itching  . Brimonidine Itching  . Timolol Itching    Consultations:  Orthopedic surgery.   Procedures/Studies: Ct Head Wo Contrast  Result Date: 01/25/2018 CLINICAL DATA:  Pt has Alzheimer Larey Seat and had a slight bump to her head but there are no bruises or cuts EXAM: CT HEAD WITHOUT CONTRAST TECHNIQUE: Contiguous axial images were obtained from the base of the skull through the vertex without intravenous contrast. COMPARISON:  08/02/2009 FINDINGS: Brain: No evidence of acute infarction, hemorrhage, hydrocephalus, extra-axial collection or mass lesion/mass effect. There is ventricular and sulcal enlargement reflecting generalized atrophy, increased when compared to the prior CT. Vascular: No hyperdense vessel or unexpected calcification. Skull: Normal. Negative for fracture or focal lesion. Sinuses/Orbits: Visualize globes and orbits are unremarkable. Visualized sinuses and mastoid air cells are clear. Other: None. IMPRESSION: 1. No acute intracranial abnormalities. 2. Atrophy increased when compared to the prior head CT. Electronically Signed   By: Amie Portland M.D.   On: 01/25/2018 18:38   Pelvis Portable  Result Date: 01/26/2018 CLINICAL DATA:  Status post right hip replacement. EXAM: PORTABLE PELVIS 1-2 VIEWS COMPARISON:  Operative images obtained today. FINDINGS: Right hip arthroplasty appears well-seated and aligned. There is no new fracture or evidence of an operative complication. IMPRESSION: Well-positioned right hip arthroplasty. Electronically Signed   By: Amie Portland M.D.   On: 01/26/2018 14:00   Dg C-arm 1-60 Min  Result Date:  01/26/2018 CLINICAL DATA:  Operative imaging provided for right hip arthroplasty placement. EXAM: OPERATIVE RIGHT HIP (WITH PELVIS IF PERFORMED) 4 VIEWS TECHNIQUE: Fluoroscopic spot image(s) were submitted for interpretation post-operatively. COMPARISON:  01/25/2018 FINDINGS: Images show placement of a right hip arthroplasty. The arthroplasty appears well-seated and well aligned. No evidence of an operative complication. IMPRESSION: Well-positioned right hip arthroplasty Electronically Signed   By: Amie Portland M.D.   On: 01/26/2018 13:26   Dg Hip Operative Unilat With Pelvis Right  Result Date: 01/26/2018 CLINICAL DATA:  Operative imaging provided for right hip arthroplasty placement. EXAM: OPERATIVE RIGHT HIP (WITH PELVIS IF PERFORMED) 4 VIEWS TECHNIQUE: Fluoroscopic spot image(s) were submitted for interpretation post-operatively. COMPARISON:  01/25/2018 FINDINGS: Images show placement of a right hip arthroplasty. The arthroplasty appears well-seated and well aligned. No evidence of an operative complication. IMPRESSION: Well-positioned right hip arthroplasty Electronically Signed   By: Amie Portland M.D.   On: 01/26/2018 13:26   Dg Hips Bilat With Pelvis Min 5 Views  Result Date: 01/25/2018 CLINICAL DATA:  Status post fall with bilateral hip pain. EXAM: DG HIP (WITH OR WITHOUT PELVIS) 5+V BILAT COMPARISON:  None. FINDINGS: There is a fracture of the right femoral neck. There is no dislocation. There is a 1.5 x 1.6 cm  lytic lesion in the left greater trochanter. Degenerative joint changes of the spine are noted. Extensive bowel content is identified in the visualized colon. IMPRESSION: Fracture of the right femoral neck.  There is no dislocation. 1.5 x 1.6 cm lytic lesion in the left greater trochanter. This is nonspecific. Differential diagnosis includes but is not limited to myeloma lesion, metastasis. Clinical correlation is recommended. Electronically Signed   By: Sherian Rein M.D.   On: 01/25/2018  17:51       Subjective: Patient has been feeling well, no nausea or vomiting. Has been working with physical therapy.   Discharge Exam: Vitals:   01/28/18 1946 01/29/18 0527  BP: 129/88 (!) 174/96  Pulse: (!) 122 (!) 111  Resp:    Temp: 99.2 F (37.3 C) 97.8 F (36.6 C)  SpO2: 93% 96%   Vitals:   01/28/18 0500 01/28/18 1503 01/28/18 1946 01/29/18 0527  BP: (!) 149/89 (!) 156/88 129/88 (!) 174/96  Pulse: (!) 105 (!) 130 (!) 122 (!) 111  Resp: 17     Temp: 97.9 F (36.6 C) 98.9 F (37.2 C) 99.2 F (37.3 C) 97.8 F (36.6 C)  TempSrc: Axillary Oral Oral Oral  SpO2: 94% 91% 93% 96%  Weight:      Height:        General: Not in pain or dyspnea, deconditioned Neurology: somnolent.  E ENT: mild pallor, no icterus, oral mucosa moist Cardiovascular: No JVD. S1-S2 present, rhythmic, no gallops, rubs, or murmurs. No lower extremity edema. Pulmonary: decreased breath sounds bilaterally, adequate air movement, no wheezing, rhonchi or rales. Gastrointestinal. Abdomen flat, no organomegaly, non tender, no rebound or guarding Skin. No rashes Musculoskeletal: no joint deformities   The results of significant diagnostics from this hospitalization (including imaging, microbiology, ancillary and laboratory) are listed below for reference.     Microbiology: Recent Results (from the past 240 hour(s))  MRSA PCR Screening     Status: None   Collection Time: 01/26/18  1:05 AM  Result Value Ref Range Status   MRSA by PCR NEGATIVE NEGATIVE Final    Comment:        The GeneXpert MRSA Assay (FDA approved for NASAL specimens only), is one component of a comprehensive MRSA colonization surveillance program. It is not intended to diagnose MRSA infection nor to guide or monitor treatment for MRSA infections. Performed at [facility], [address] [place], [place] [address]      Labs: BNP (last 3 results) No results for input(s): BNP in the last 8760 hours. Basic  Metabolic Panel: Recent Labs  Lab 01/25/18 1852 01/26/18 0847 01/26/18 1500 01/27/18 0546 01/28/18 0540 01/29/18 0724  NA 139 138  --  139 137 139  K 3.3* 3.3*  --  4.1 3.7 3.2*  CL 105 105  --  108 108 105  CO2 25 23  --  23 21* 24  GLUCOSE 135* 120*  --  108* 104* 107*  BUN 10 6  --  6 9 5*  CREATININE 0.81 0.70 0.75 0.90 0.72 0.71  CALCIUM 8.8* 8.4*  --  8.4* 8.0* 8.2*  MG  --  1.8  --   --   --   --    Liver Function Tests: Recent Labs  Lab 01/25/18 1852  AST 26  ALT 11*  ALKPHOS 54  BILITOT 1.1  PROT 5.8*  ALBUMIN 3.4*   No results for input(s): LIPASE, AMYLASE in the last 168 hours. No results for input(s): AMMONIA in  the last 168 hours. CBC: Recent Labs  Lab 01/25/18 1852 01/26/18 0847 01/26/18 1500 01/27/18 0546 01/28/18 0540 01/29/18 0724  WBC 7.8 5.3 6.2 5.2 4.8 4.2  NEUTROABS 7.0  --   --  3.8 3.2 2.7  HGB 12.5 11.3* 11.5* 10.1* 9.7* 9.6*  HCT 39.8 35.7* 35.5* 32.3* 30.0* 29.3*  MCV 84.9 84.2 86.8 84.6 84.0 84.2  PLT 175 141* 123* 123* 112* 127*   Cardiac Enzymes: No results for input(s): CKTOTAL, CKMB, CKMBINDEX, TROPONINI in the last 168 hours. BNP: Invalid input(s): POCBNP CBG: Recent Labs  Lab 01/27/18 1241  GLUCAP 133*   D-Dimer No results for input(s): DDIMER in the last 72 hours. Hgb A1c No results for input(s): HGBA1C in the last 72 hours. Lipid Profile No results for input(s): CHOL, HDL, LDLCALC, TRIG, CHOLHDL, LDLDIRECT in the last 72 hours. Thyroid function studies No results for input(s): TSH, T4TOTAL, T3FREE, THYROIDAB in the last 72 hours.  Invalid input(s): FREET3 Anemia work up No results for input(s): VITAMINB12, FOLATE, FERRITIN, TIBC, IRON, RETICCTPCT in the last 72 hours. Urinalysis No results found for: COLORURINE, APPEARANCEUR, LABSPEC, PHURINE, GLUCOSEU, HGBUR, BILIRUBINUR, KETONESUR, PROTEINUR, UROBILINOGEN, NITRITE, LEUKOCYTESUR Sepsis Labs Invalid input(s): PROCALCITONIN,  WBC,   LACTICIDVEN Microbiology Recent Results (from the past 240 hour(s))  MRSA PCR Screening     Status: None   Collection Time: 01/26/18  1:05 AM  Result Value Ref Range Status   MRSA by PCR NEGATIVE NEGATIVE Final    Comment:        The GeneXpert MRSA Assay (FDA approved for NASAL specimens only), is one component of a comprehensive MRSA colonization surveillance program. It is not intended to diagnose MRSA infection nor to guide or monitor treatment for MRSA infections. Performed at [facility], [address] [place], [place] [address]      Time coordinating discharge: 45 minutes  SIGNED:   Coralie KeensMauricio Daniel Tyshun Tuckerman, MD  [facility] 01/29/2018, 11:27 AM Pager [phone]  If 7PM-7AM, please contact night-coverage [email or web address] Password [email or web address]

## 2018-01-29 NOTE — Care Management Note (Signed)
Case Management Note  Patient Details  Name: Seward CarolMontez P Sakamoto MRN: [id number] Date of Birth: March 25, 1930  Subjective/Objective:    Right hip fracture, s/p R THA                Action/Plan: NCM spoke to pt's son, Bethann BerkshireJohnny  and niece, [name] [phone] at bedside. Offered choice for HH/list provided. Son states he wants to discuss pt going to SNF and wanted to go visit [facility]. Pt will need hospital bed, RW and 3n1 bedside commode if she goes home.   01/29/2018 4:55 pm Contacted niece and pt's son is currently at [facility] on a tour. Waiting call back with decision. [facility]   Expected Discharge Date:  01/29/18               Expected Discharge Plan:  Home w Home Health Services  In-House Referral:  Clinical Social Work  Discharge planning Services  CM Consult  Post Acute Care Choice:  Home Health Choice offered to:  Adult Children  DME Arranged:  3-N-1, Walker rolling, Hospital bed DME Agency:  [facility]  HH Arranged:  PT, OT, Nurse's Aide, RN, [facility] Agency:    Status of Service:  In process, will continue to follow  If discussed at Long Length of Stay Meetings, dates discussed:    Additional Comments:  Elliot CousinShavis, Jesiel Garate Ellen, RN 01/29/2018, 4:47 PM

## 2018-01-29 NOTE — Progress Notes (Signed)
Bladder scanned pt and got 107 will notify MD for further instructions

## 2018-01-29 NOTE — Progress Notes (Signed)
NT bladder scanned Pt only had 90 in bladder encouraged family to get her to drink more will continue to monitor again

## 2018-01-29 NOTE — Progress Notes (Signed)
Paged PT due to patient concerns of not having worked with PT today, awaiting response.

## 2018-01-29 NOTE — Social Work (Signed)
[facility] that family needed the SNF offer list that was provided yesterday as they were not sure yet of SNF vs. Home.  CSW discussed with RN and she will f/u with doctor. RNCM advised of same.  CSW will continue to follow up.  Keene BreathPatricia Jakari Sada, LCSW Clinical Social Worker [phone]

## 2018-01-29 NOTE — Social Work (Addendum)
CSW met with son, niece at bedside and they indicated that they would like to take patient home and do not wish to go to SNF. CSW then advised floor RN and RNCM to assist with home needs.  CSW paged Dr.Arrien.  CSW will sign off for now as social work intervention is no longer needed. Please consult [place] again if new need arises.  Elissa Hefty, LCSW Clinical Social Worker [phone]

## 2018-01-29 NOTE — Social Work (Addendum)
CSW contacted son to discuss SNF offers. CSW unable to reach and left message.  CSW will f/u for placement. SNF will need to obtain Insurance Auth.  1:03pm: CSW contacted the niece and left message requesting a call back.  CSW will f/u.  Keene BreathPatricia Fina Heizer, LCSW Clinical Social Worker [phone]

## 2018-01-30 LAB — CBC WITH DIFFERENTIAL/PLATELET
Basophils Absolute: 0 10*3/uL (ref 0.0–0.1)
Basophils Relative: 1 %
EOS PCT: 3 %
Eosinophils Absolute: 0.1 10*3/uL (ref 0.0–0.7)
HCT: 28.8 % — ABNORMAL LOW (ref 36.0–46.0)
Hemoglobin: 9.4 g/dL — ABNORMAL LOW (ref 12.0–15.0)
LYMPHS ABS: 1.2 10*3/uL (ref 0.7–4.0)
LYMPHS PCT: 27 %
MCH: 27.4 pg (ref 26.0–34.0)
MCHC: 32.6 g/dL (ref 30.0–36.0)
MCV: 84 fL (ref 78.0–100.0)
MONO ABS: 0.4 10*3/uL (ref 0.1–1.0)
MONOS PCT: 9 %
Neutro Abs: 2.6 10*3/uL (ref 1.7–7.7)
Neutrophils Relative %: 60 %
PLATELETS: 139 10*3/uL — AB (ref 150–400)
RBC: 3.43 MIL/uL — AB (ref 3.87–5.11)
RDW: 14.7 % (ref 11.5–15.5)
WBC: 4.3 10*3/uL (ref 4.0–10.5)

## 2018-01-30 LAB — BASIC METABOLIC PANEL
ANION GAP: 9 (ref 5–15)
BUN: 6 mg/dL (ref 6–20)
CHLORIDE: 105 mmol/L (ref 101–111)
CO2: 25 mmol/L (ref 22–32)
Calcium: 8 mg/dL — ABNORMAL LOW (ref 8.9–10.3)
Creatinine, Ser: 0.63 mg/dL (ref 0.44–1.00)
GFR calc Af Amer: >60 mL/min (ref >60)
GFR calc non Af Amer: >60 mL/min (ref >60)
Glucose, Bld: 95 mg/dL (ref 65–99)
POTASSIUM: 3.2 mmol/L — AB (ref 3.5–5.1)
SODIUM: 139 mmol/L (ref 135–145)

## 2018-01-30 LAB — PROTEIN ELECTROPHORESIS, SERUM
A/G Ratio: 1.4 (ref 0.7–1.7)
ALBUMIN ELP: 3 g/dL (ref 2.9–4.4)
Alpha-1-Globulin: 0.2 g/dL (ref 0.0–0.4)
Alpha-2-Globulin: 0.5 g/dL (ref 0.4–1.0)
Beta Globulin: 0.8 g/dL (ref 0.7–1.3)
GLOBULIN, TOTAL: 2.2 g/dL (ref 2.2–3.9)
Gamma Globulin: 0.7 g/dL (ref 0.4–1.8)
M-SPIKE, %: 0.2 g/dL — AB
TOTAL PROTEIN ELP: 5.2 g/dL — AB (ref 6.0–8.5)

## 2018-01-30 NOTE — Clinical Social Work Placement (Signed)
   CLINICAL SOCIAL WORK PLACEMENT  NOTE  Date:  01/30/2018  Patient Details  Name: Erica Vasquez MRN: [id number] Date of Birth: 06-30-1930  Clinical Social Work is seeking post-discharge placement for this patient at the Skilled  Nursing Facility level of care (*CSW will initial, date and re-position this form in  chart as items are completed):  Yes   Patient/family provided with [facility] Social Work Department's list of facilities offering this level of care within the geographic area requested by the patient (or if unable, by the patient's family).  Yes   Patient/family informed of their freedom to choose among providers that offer the needed level of care, that participate in Medicare, Medicaid or managed care program needed by the patient, have an available bed and are willing to accept the patient.  Yes   Patient/family informed of [facility]'s ownership interest in [facility] and [facility], as well as of the fact that they are under no obligation to receive care at these facilities.  PASRR submitted to EDS on 01/27/18     PASRR number received on 01/27/18     Existing PASRR number confirmed on       FL2 transmitted to all facilities in geographic area requested by pt/family on 01/27/18     FL2 transmitted to all facilities within larger geographic area on       Patient informed that his/her managed care company has contracts with or will negotiate with certain facilities, including the following:        Yes   Patient/family informed of bed offers received.  Patient chooses bed at [facility]     Physician recommends and patient chooses bed at      [facility] to [facility] on 01/30/18.  Patient to be transferred to facility by [facility]     Patient family notified on 01/30/18 of transfer.  Name of family member notified:  son, John & Neice [name] contacted     PHYSICIAN Please sign FL2      Additional Comment:    _______________________________________________ Tresa MoorePatricia V Davion Flannery, LCSW 01/30/2018, 3:36 PM

## 2018-01-30 NOTE — Social Work (Signed)
Clinical Social Worker facilitated patient discharge including contacting patient family and facility to confirm patient discharge plans.  Clinical information faxed to facility and family agreeable with plan.    CSW arranged ambulance transport via [facility] to [facility].    RN to call [phone] to give report prior to discharge.  Clinical Social Worker will sign off for now as social work intervention is no longer needed. Please consult us again if new need arises.  Damarko Stitely, LCSW Clinical Social Worker [phone]    

## 2018-01-30 NOTE — Social Work (Addendum)
CSW met with patient at bedside and discussed SNF placement with son and niece. Family accepted SNF bed offer from [facility]  CSW then called SNF admission and left message requesting a call back. SNF will need to initiate Insurance Auth.  CSW will f/u.  1:25pm SNF admission staff confirmed placement and will initiate Insurance Auth. CSW advised family of same.  CSW will f/u.  2:45pm: SNF received Insurance Auth. CSW f/u for disposition.  Elissa Hefty, LCSW Clinical Social Worker [phone]

## 2018-01-30 NOTE — Progress Notes (Signed)
Patient is medically stable to be discharge home today.

## 2018-01-30 NOTE — Progress Notes (Signed)
Physical Therapy Treatment Patient Details Name: Erica Vasquez MRN: [id number] DOB: [birth year] Today's Date: 01/30/2018    History of Present Illness Erica Vasquez is a 84 y.o. female with medical history significant of hypertension, dementia, bilateral blindness, who presents with fall and right hip pain. Pt undwerwent direct anterior THA by Dr Roda ShuttersXu    PT Comments    Current plan remains appropriate. Pt required max A for bed mobility this session. Session limited due to pt incontinent of bowel X2 and bladder X1. Continue to progress as tolerated.    Follow Up Recommendations  SNF;Supervision/Assistance - 24 hour     Equipment Recommendations  None recommended by PT    Recommendations for Other Services OT consult     Precautions / Restrictions Precautions Precautions: Fall Precaution Comments: pt is blind as well as having dementia Restrictions Weight Bearing Restrictions: Yes RLE Weight Bearing: Weight bearing as tolerated    Mobility  Bed Mobility Overal bed mobility: Needs Assistance Bed Mobility: Rolling Rolling: Max assist;+2 for physical assistance         General bed mobility comments: assist for all aspects of bed mobility; pt resisted mobility however did allow therapist and tech to roll R and L for pericare and pad placement; pt incontinent of bowel and bladder just after returning to supine and RN notified  Transfers                 General transfer comment: not attempted this session   Ambulation/Gait                 Stairs            Wheelchair Mobility    Modified Rankin (Stroke Patients Only)       Balance                                            Cognition Arousal/Alertness: Awake/alert Behavior During Therapy: WFL for tasks assessed/performed Overall Cognitive Status: History of cognitive impairments - at baseline                                 General Comments: pt repeated  "take it all" throughout session and not responding to any questions      Exercises      General Comments        Pertinent Vitals/Pain Pain Assessment: Faces Faces Pain Scale: Hurts even more Pain Location: R hip Pain Descriptors / Indicators: Grimacing;Moaning;Guarding Pain Intervention(s): Limited activity within patient's tolerance;Monitored during session;Repositioned    Home Living                      Prior Function            PT Goals (current goals can now be found in the care plan section) Acute Rehab PT Goals PT Goal Formulation: With family Time For Goal Achievement: 02/10/18 Potential to Achieve Goals: Fair Progress towards PT goals: Progressing toward goals    Frequency    Min 3X/week      PT Plan Current plan remains appropriate    Co-evaluation              AM-PAC PT "6 Clicks" Daily Activity  Outcome Measure  Difficulty turning over in bed (including adjusting bedclothes, sheets and blankets)?: Unable  Difficulty moving from lying on back to sitting on the side of the bed? : Unable Difficulty sitting down on and standing up from a chair with arms (e.g., wheelchair, bedside commode, etc,.)?: Unable Help needed moving to and from a bed to chair (including a wheelchair)?: Total Help needed walking in hospital room?: Total Help needed climbing 3-5 steps with a railing? : Total 6 Click Score: 6    End of Session   Activity Tolerance: Patient tolerated treatment well Patient left: in bed;with bed alarm set Nurse Communication: Mobility status PT Visit Diagnosis: Unsteadiness on feet (R26.81);Difficulty in walking, not elsewhere classified (R26.2);Pain;Adult, failure to thrive (R62.7);History of falling (Z91.81) Pain - Right/Left: Right Pain - part of body: Hip     Time: 1009-1030 PT Time Calculation (min) (ACUTE ONLY): 21 min  Charges:  $Therapeutic Activity: 8-22 mins                    G Codes:       Erline Levine,  PTA Pager: [phone]     Carolynne Edouard 01/30/2018, 11:14 AM

## 2018-01-31 ENCOUNTER — Encounter: Payer: Self-pay | Admitting: Adult Health

## 2018-01-31 ENCOUNTER — Non-Acute Institutional Stay (SKILLED_NURSING_FACILITY): Payer: Medicare Other | Admitting: Adult Health

## 2018-01-31 DIAGNOSIS — I10 Essential (primary) hypertension: Secondary | ICD-10-CM | POA: Diagnosis not present

## 2018-01-31 DIAGNOSIS — S72001S Fracture of unspecified part of neck of right femur, sequela: Secondary | ICD-10-CM

## 2018-01-31 DIAGNOSIS — K5903 Drug induced constipation: Secondary | ICD-10-CM | POA: Insufficient documentation

## 2018-01-31 DIAGNOSIS — G301 Alzheimer's disease with late onset: Secondary | ICD-10-CM | POA: Diagnosis not present

## 2018-01-31 DIAGNOSIS — E440 Moderate protein-calorie malnutrition: Secondary | ICD-10-CM

## 2018-01-31 DIAGNOSIS — G309 Alzheimer's disease, unspecified: Secondary | ICD-10-CM

## 2018-01-31 DIAGNOSIS — T402X5A Adverse effect of other opioids, initial encounter: Secondary | ICD-10-CM | POA: Insufficient documentation

## 2018-01-31 DIAGNOSIS — F0280 Dementia in other diseases classified elsewhere without behavioral disturbance: Secondary | ICD-10-CM | POA: Insufficient documentation

## 2018-01-31 NOTE — Progress Notes (Addendum)
Location:   [facility] Room Number: 507 P Place of Service:  SNF (31)   CODE STATUS: Full Code  Allergies  Allergen Reactions  . Bimatoprost Itching  . Brimonidine Itching  . Timolol Itching    Chief Complaint  Patient presents with  . Hospitalization Follow-up    hospital follow up    HPI:  She is an 84 year old woman who has a history of hypertension; Alzheimer's disease; malnutrition has been hospitalized from 01-25-18 through 01-29-18. She had a mechanical fall and suffered a right femoral neck fracture and underwent a total right hip arthroplasty on 01-26-18. She is here for short term rehab; more than likely this does represent a long term placement for her. She is unable to participate in the hpi or ros. There are no reports of uncontrolled pain; no reports of behavioral issues; does have inward rotation of her right leg. There are no nursing concerns at this time.    Past Medical History:  Diagnosis Date  . Alzheimer disease   . Blind   . Dementia   . Essential hypertension     Past Surgical History:  Procedure Laterality Date  . ABDOMINAL HYSTERECTOMY    . CATARACT EXTRACTION    . TOTAL HIP ARTHROPLASTY Right 01/26/2018   Procedure: TOTAL HIP ARTHROPLASTY ANTERIOR APPROACH;  Surgeon: Tarry Kos, MD;  Location: [facility] OR;  Service: Orthopedics;  Laterality: Right;    Social History   Socioeconomic History  . Marital status: Single    Spouse name: Not on file  . Number of children: Not on file  . Years of education: Not on file  . Highest education level: Not on file  Occupational History  . Not on file  Social Needs  . Financial resource strain: Not on file  . Food insecurity:    Worry: Not on file    Inability: Not on file  . Transportation needs:    Medical: Not on file    Non-medical: Not on file  Tobacco Use  . Smoking status: Never Smoker  . Smokeless tobacco: Never Used  Substance and Sexual Activity  . Alcohol use:  Not Currently  . Drug use: Not Currently  . Sexual activity: Not on file  Lifestyle  . Physical activity:    Days per week: Not on file    Minutes per session: Not on file  . Stress: Not on file  Relationships  . Social connections:    Talks on phone: Not on file    Gets together: Not on file    Attends religious service: Not on file    Active member of club or organization: Not on file    Attends meetings of clubs or organizations: Not on file    Relationship status: Not on file  . Intimate partner violence:    Fear of current or ex partner: Not on file    Emotionally abused: Not on file    Physically abused: Not on file    Forced sexual activity: Not on file  Other Topics Concern  . Not on file  Social History Narrative  . Not on file   Family History  Problem Relation Age of Onset  . Dementia Mother   . Dementia Father   . Glaucoma Father       VITAL SIGNS Pulse 74   Temp 98 F (36.7 C)   Resp 20   Ht 4\' 11"  (1.499 m)   Wt 94 lb (  42.6 kg)   SpO2 97%   BMI 18.99 kg/m   Outpatient Encounter Medications as of 01/31/2018  Medication Sig  . amLODipine (NORVASC) 2.5 MG tablet Take 1 tablet (2.5 mg total) by mouth daily.  Marland Kitchen. docusate (COLACE) 50 MG/5ML liquid Take 10 mLs (100 mg total) by mouth 2 (two) times daily.  Marland Kitchen. enoxaparin (LOVENOX) 40 MG/0.4ML injection Inject 0.4 mLs (40 mg total) into the skin daily.  Marland Kitchen. oxyCODONE-acetaminophen (PERCOCET) 5-325 MG tablet Take 1-2 tablets by mouth every 4 (four) hours as needed for severe pain.   No facility-administered encounter medications on file as of 01/31/2018.      SIGNIFICANT DIAGNOSTIC EXAMS  TODAY:  01-25-18: bilateral hip and pelvis: Fracture of the right femoral neck.  There is no dislocation. 1.5 x 1.6 cm lytic lesion in the left greater trochanter. This is nonspecific. Differential diagnosis includes but is not limited to myeloma lesion, metastasis. Clinical correlation is recommended.  01-25-18: ct of head:  1. No acute intracranial abnormalities. 2. Atrophy increased when compared to the prior head CT.  01-29-18: MRI left hip: 1. No focal bone lesion.  No acute osseous abnormality. 2. Postsurgical changes related to recent right total hip arthroplasty. No large hematoma or fluid collection. 3. Mild left hip osteoarthritis.  LABS REVIEWED: TODAY:   01-25-18: wbc 7.8; hgb 12.5; hct 39.8; mcv 84.9; plt 175; glucose 135; bun 10; creat 0.81; k+ 3.3; na++ 139; ca 8.8; liver normal albumin 3.4 01-26-18: wbc 5.3; hgb 11.3; hct 35.7; mcv 84.2; plt 141; glucose 120; bun 6; creat 0.70; k+ 3.5; na++ 138; ca 8.4 mag 1.8 01-30-18: wbc 4.3; hgb 9.4; hct 28.8; mcv 84.0; plt 139; glucose 95; bun 6; creat 0.63; k+ 3.2; na++ 139; ca 8.0     Review of Systems  Unable to perform ROS: Dementia (confusion)    Physical Exam  Constitutional: No distress.  Frail   Eyes:  Blind in both eyes   Neck: No thyromegaly present.  Cardiovascular: Normal rate, regular rhythm, normal heart sounds and intact distal pulses.  Pulmonary/Chest: Effort normal and breath sounds normal. No respiratory distress.  Abdominal: Soft. Bowel sounds are normal. She exhibits no distension. There is no tenderness.  Musculoskeletal: She exhibits no edema.  Is resistant to movement of her lower extremities especially her right leg  Lymphadenopathy:    She has no cervical adenopathy.  Neurological:  Is aware   Skin: Skin is warm and dry. She is not diaphoretic.  Right hip dressing intact without signs of infection present       ASSESSMENT/ PLAN:  TODAY:   1. Essential hypertension; benign: stable will continue norvasc 2.5 mg daily   2. Closed fracture of femoral neck, right: stable will continue therapy as directed and will follow up with orthopedics will continue lovenox 30 mg daily for 14 days; will continue lovenox 40 mg daily for total 14 days. Will continue percocet 5/325 mg 1 or 2 tabs every 4 hours as needed  3. Constipation due  to constipation due to opioid therapy: stable will continue colace twice daily   4. Alzheimer's disease late onset without behavioral disturbance: no change in status: weight is 94 pounds; will continue to monitor her status.   5. Malnutrition of moderate degree: without change; albumin 3.4; will continue supplements as directed   Will check cbc; bmp    MD is aware of resident's narcotic use and is in agreement with current plan of care. We will attempt to wean resident as  apropriate   Ok Edwards NP [facility]  Contact [phone] Monday through Friday 8am- 5pm  After hours call [phone]

## 2018-02-03 LAB — CBC AND DIFFERENTIAL
HEMATOCRIT: 29 — AB (ref 36–46)
HEMOGLOBIN: 9.7 — AB (ref 12.0–16.0)
Platelets: 238 (ref 150–399)
WBC: 4.1

## 2018-02-03 LAB — BASIC METABOLIC PANEL
BUN: 7 (ref 4–21)
Creatinine: 0.5 (ref 0.5–1.1)
GLUCOSE: 93
Potassium: 3.3 — AB (ref 3.4–5.3)
Sodium: 141 (ref 137–147)

## 2018-02-04 ENCOUNTER — Non-Acute Institutional Stay (SKILLED_NURSING_FACILITY): Payer: Medicare Other | Admitting: Internal Medicine

## 2018-02-04 ENCOUNTER — Encounter: Payer: Self-pay | Admitting: Internal Medicine

## 2018-02-04 DIAGNOSIS — G301 Alzheimer's disease with late onset: Secondary | ICD-10-CM | POA: Diagnosis not present

## 2018-02-04 DIAGNOSIS — D649 Anemia, unspecified: Secondary | ICD-10-CM | POA: Diagnosis not present

## 2018-02-04 DIAGNOSIS — F0280 Dementia in other diseases classified elsewhere without behavioral disturbance: Secondary | ICD-10-CM

## 2018-02-04 DIAGNOSIS — S72001S Fracture of unspecified part of neck of right femur, sequela: Secondary | ICD-10-CM | POA: Diagnosis not present

## 2018-02-04 DIAGNOSIS — I10 Essential (primary) hypertension: Secondary | ICD-10-CM | POA: Diagnosis not present

## 2018-02-04 NOTE — Progress Notes (Signed)
Provider: Einar Crow  Location:   [facility] Room Number: 507/P Place of Service:  SNF (31)  PCP: Mirna Mires, MD Patient Care Team: Mirna Mires, MD as PCP - General (Family Medicine)  Extended Emergency Contact Information Primary Emergency Contact: Erica Vasquez,Erica Vasquez Home Phone: [phone] Mobile Phone: [phone] Relation: Niece Secondary Emergency Contact: Erica Vasquez, Erica Vasquez Mobile Phone: [phone] Relation: Son  Code Status: Full Code Goals of Care: Advanced Directive information Advanced Directives 02/04/2018  Does Patient Have a Medical Advance Directive? Yes  Type of Advance Directive (No Data)  Does patient want to make changes to medical advance directive? No - Patient declined  Would patient like information on creating a medical advance directive? No - Patient declined      Chief Complaint  Patient presents with  . New Admit To SNF    New Admission Visit    HPI: Patient is a 84 y.o. female seen today for admission to SNF for therapy after staying in the hospital from 04/06-04/10 for Right Total Hip Arthroplasty.  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 Patient had a mechanical fall at home was brought to the ED. she was found to have right hip fracture and she underwent total hip arthroplasty on 04/07. Her postop course was uneventful.  She was transferred to SNF for therapy. She was started on amlodipine low-dose for her blood pressure in the hospital. Patient unable to give me any history.  Talked to her niece in the room.   Patient has significant dementia and has failed  treatment before. She lives with her son.  Needs help in her ADLs.  Was walking with her walker. Past Medical History:  Diagnosis Date  . Alzheimer disease   . Blind   . Dementia   . Essential hypertension    Past Surgical History:  Procedure Laterality Date  . ABDOMINAL  HYSTERECTOMY    . CATARACT EXTRACTION    . TOTAL HIP ARTHROPLASTY Right 01/26/2018   Procedure: TOTAL HIP ARTHROPLASTY ANTERIOR APPROACH;  Surgeon: Tarry Kos, MD;  Location: [facility] OR;  Service: Orthopedics;  Laterality: Right;    reports that she has never smoked. She has never used smokeless tobacco. She reports that she drank alcohol. She reports that she has current or past drug history. Social History   Socioeconomic History  . Marital status: Single    Spouse name: Not on file  . Number of children: Not on file  . Years of education: Not on file  . Highest education level: Not on file  Occupational History  . Not on file  Social Needs  . Financial resource strain: Not on file  . Food insecurity:    Worry: Not on file    Inability: Not on file  . Transportation needs:    Medical: Not on file    Non-medical: Not on file  Tobacco Use  . Smoking status: Never Smoker  . Smokeless tobacco: Never Used  Substance and Sexual Activity  . Alcohol use: Not Currently  . Drug use: Not Currently  . Sexual activity: Not on file  Lifestyle  . Physical activity:    Days per week: Not on file    Minutes per session: Not on file  . Stress: Not on file  Relationships  . Social connections:    Talks on phone: Not on file    Gets together: Not on file    Attends religious service: Not  on file    Active member of club or organization: Not on file    Attends meetings of clubs or organizations: Not on file    Relationship status: Not on file  . Intimate partner violence:    Fear of current or ex partner: Not on file    Emotionally abused: Not on file    Physically abused: Not on file    Forced sexual activity: Not on file  Other Topics Concern  . Not on file  Social History Narrative  . Not on file    Functional Status Survey:    Family History  Problem Relation Age of Onset  . Dementia Mother   . Dementia Father   . Glaucoma Father     Health Maintenance  Topic Date Due    . DEXA SCAN  03/04/2018 (Originally 03/15/1997)  . INFLUENZA VACCINE  05/22/2018  . TETANUS/TDAP  Discontinued  . PNA vac Low Risk Adult  Discontinued    Allergies  Allergen Reactions  . Bimatoprost Itching  . Brimonidine Itching  . Timolol Itching    Allergies as of 02/04/2018      Reactions   Bimatoprost Itching   Brimonidine Itching   Timolol Itching      Medication List        Accurate as of 02/04/18 11:51 AM. Always use your most recent med list.          amLODipine 2.5 MG tablet Commonly known as:  NORVASC Take 1 tablet (2.5 mg total) by mouth daily.   docusate 50 MG/5ML liquid Commonly known as:  COLACE Take 10 mLs (100 mg total) by mouth 2 (two) times daily.   oxyCODONE-acetaminophen 5-325 MG tablet Commonly known as:  PERCOCET Take 1-2 tablets by mouth every 4 (four) hours as needed for severe pain.       Review of Systems  Unable to perform ROS: Dementia    Vitals:   02/04/18 1151  BP: (!) 144/80  Pulse: 90  Resp: 20  Temp: 98.4 F (36.9 C)  TempSrc: Oral   There is no height or weight on file to calculate BMI. Physical Exam  Constitutional: She appears well-developed and well-nourished.  HENT:  Head: Normocephalic.  Mouth/Throat: Oropharynx is clear and moist.  Eyes:  Would not open her eyes. She is legally blind.  Neck: Normal range of motion.  Cardiovascular: Normal rate and regular rhythm.  Pulmonary/Chest: Effort normal and breath sounds normal. No stridor. No respiratory distress. She has no wheezes.  Abdominal: Soft. Bowel sounds are normal. She exhibits no distension. There is no tenderness.  Musculoskeletal:  Mild edema in Right lE.  Lymphadenopathy:    She has no cervical adenopathy.  Neurological: She is alert.  Not Oriented. Would not follow commands.  Skin: Skin is warm and dry.  Psychiatric: She has a normal mood and affect. Her behavior is normal. Thought content normal.    Labs reviewed: Basic Metabolic  Panel: Recent Labs    01/26/18 0847  01/28/18 0540 01/29/18 0724 01/30/18 0250  NA 138   < > 137 139 139  K 3.3*   < > 3.7 3.2* 3.2*  CL 105   < > 108 105 105  CO2 23   < > 21* 24 25  GLUCOSE 120*   < > 104* 107* 95  BUN 6   < > 9 5* 6  CREATININE 0.70   < > 0.72 0.71 0.63  CALCIUM 8.4*   < > 8.0* 8.2* 8.0*  MG 1.8  --   --   --   --    < > = values in this interval not displayed.   Liver Function Tests: Recent Labs    01/25/18 1852  AST 26  ALT 11*  ALKPHOS 54  BILITOT 1.1  PROT 5.8*  ALBUMIN 3.4*   No results for input(s): LIPASE, AMYLASE in the last 8760 hours. No results for input(s): AMMONIA in the last 8760 hours. CBC: Recent Labs    01/28/18 0540 01/29/18 0724 01/30/18 0250  WBC 4.8 4.2 4.3  NEUTROABS 3.2 2.7 2.6  HGB 9.7* 9.6* 9.4*  HCT 30.0* 29.3* 28.8*  MCV 84.0 84.2 84.0  PLT 112* 127* 139*   Cardiac Enzymes: No results for input(s): CKTOTAL, CKMB, CKMBINDEX, TROPONINI in the last 8760 hours. BNP: Invalid input(s): POCBNP No results found for: HGBA1C No results found for: TSH No results found for: VITAMINB12 No results found for: FOLATE No results found for: IRON, TIBC, FERRITIN  Imaging and Procedures obtained prior to SNF admission: Ct Head Wo Contrast  Result Date: 01/25/2018 CLINICAL DATA:  Pt has Alzheimer Larey Seat and had a slight bump to her head but there are no bruises or cuts EXAM: CT HEAD WITHOUT CONTRAST TECHNIQUE: Contiguous axial images were obtained from the base of the skull through the vertex without intravenous contrast. COMPARISON:  08/02/2009 FINDINGS: Brain: No evidence of acute infarction, hemorrhage, hydrocephalus, extra-axial collection or mass lesion/mass effect. There is ventricular and sulcal enlargement reflecting generalized atrophy, increased when compared to the prior CT. Vascular: No hyperdense vessel or unexpected calcification. Skull: Normal. Negative for fracture or focal lesion. Sinuses/Orbits: Visualize globes and  orbits are unremarkable. Visualized sinuses and mastoid air cells are clear. Other: None. IMPRESSION: 1. No acute intracranial abnormalities. 2. Atrophy increased when compared to the prior head CT. Electronically Signed   By: Amie Portland M.D.   On: 01/25/2018 18:38   Pelvis Portable  Result Date: 01/26/2018 CLINICAL DATA:  Status post right hip replacement. EXAM: PORTABLE PELVIS 1-2 VIEWS COMPARISON:  Operative images obtained today. FINDINGS: Right hip arthroplasty appears well-seated and aligned. There is no new fracture or evidence of an operative complication. IMPRESSION: Well-positioned right hip arthroplasty. Electronically Signed   By: Amie Portland M.D.   On: 01/26/2018 14:00   Dg C-arm 1-60 Min  Result Date: 01/26/2018 CLINICAL DATA:  Operative imaging provided for right hip arthroplasty placement. EXAM: OPERATIVE RIGHT HIP (WITH PELVIS IF PERFORMED) 4 VIEWS TECHNIQUE: Fluoroscopic spot image(s) were submitted for interpretation post-operatively. COMPARISON:  01/25/2018 FINDINGS: Images show placement of a right hip arthroplasty. The arthroplasty appears well-seated and well aligned. No evidence of an operative complication. IMPRESSION: Well-positioned right hip arthroplasty Electronically Signed   By: Amie Portland M.D.   On: 01/26/2018 13:26   Dg Hip Operative Unilat With Pelvis Right  Result Date: 01/26/2018 CLINICAL DATA:  Operative imaging provided for right hip arthroplasty placement. EXAM: OPERATIVE RIGHT HIP (WITH PELVIS IF PERFORMED) 4 VIEWS TECHNIQUE: Fluoroscopic spot image(s) were submitted for interpretation post-operatively. COMPARISON:  01/25/2018 FINDINGS: Images show placement of a right hip arthroplasty. The arthroplasty appears well-seated and well aligned. No evidence of an operative complication. IMPRESSION: Well-positioned right hip arthroplasty Electronically Signed   By: Amie Portland M.D.   On: 01/26/2018 13:26   Dg Hips Bilat With Pelvis Min 5 Views  Result Date:  01/25/2018 CLINICAL DATA:  Status post fall with bilateral hip pain. EXAM: DG HIP (WITH OR WITHOUT PELVIS) 5+V BILAT COMPARISON:  None.  FINDINGS: There is a fracture of the right femoral neck. There is no dislocation. There is a 1.5 x 1.6 cm lytic lesion in the left greater trochanter. Degenerative joint changes of the spine are noted. Extensive bowel content is identified in the visualized colon. IMPRESSION: Fracture of the right femoral neck.  There is no dislocation. 1.5 x 1.6 cm lytic lesion in the left greater trochanter. This is nonspecific. Differential diagnosis includes but is not limited to myeloma lesion, metastasis. Clinical correlation is recommended. Electronically Signed   By: Sherian ReinWei-Chen  Lin M.D.   On: 01/25/2018 17:51    Assessment/Plan Essential hypertension Per Patient she was on niece some kind of a patch for her hypertension but did not tolerated well. She was started on low-dose of amlodipine in the hospital We will continue that for now Follow-up and close monitoring of blood pressure  S/P Right Total hip Arthroplasty Continue oxycodone for pain control Patient comfortable and had started working with therapy WBAT She was on Lovenox for first week for DVT prophylaxis Follow-up with Ortho  Anemia, Hgb 9.7 in Facility which is low but stable Repeat CBC in 1 week.  Left Trochanter Lesion  Patient had an MRI done in the hospital which did not show any kind of a lytic lesion. Her SPE showed Faint band in gamma region suspicious for monoclonal immunoglobulin. she will need follow-up as outpatient  Dementia  apparently this patient has been on Meds before but did not tolerate it. Progressively getting dependent for her ADLs  She is also legally blind the son was also in the room.     Patient does have severe dementia and not sure how much she would be able to do with therapy. continues to be high risk for fall.    Family/ staff Communication:   Labs/tests ordered:  Repeat CBC in 1 week. Total time spent in this patient care encounter was _45 minutes; greater than 50% of the visit spent counseling patient, reviewing records , Labs and coordinating care for problems addressed at this encounter.

## 2018-02-05 ENCOUNTER — Telehealth (INDEPENDENT_AMBULATORY_CARE_PROVIDER_SITE_OTHER): Payer: Self-pay | Admitting: Orthopaedic Surgery

## 2018-02-05 NOTE — Telephone Encounter (Signed)
Son called on behalf of mother in rehab, she has an appointment Monday with Mardella LaymanLindsey and he doesn't know if she'll be able to make it since she can barely move. I thought the facility brought them, but he would like a call back. [phone]

## 2018-02-06 NOTE — Telephone Encounter (Signed)
Tried to call son no answer but if patient is in a SNF they should be able to bring her.

## 2018-02-10 ENCOUNTER — Encounter (INDEPENDENT_AMBULATORY_CARE_PROVIDER_SITE_OTHER): Payer: Self-pay | Admitting: Physician Assistant

## 2018-02-10 ENCOUNTER — Ambulatory Visit (INDEPENDENT_AMBULATORY_CARE_PROVIDER_SITE_OTHER): Payer: Medicare Other

## 2018-02-10 ENCOUNTER — Ambulatory Visit (INDEPENDENT_AMBULATORY_CARE_PROVIDER_SITE_OTHER): Payer: Medicare Other | Admitting: Physician Assistant

## 2018-02-10 DIAGNOSIS — Z96641 Presence of right artificial hip joint: Secondary | ICD-10-CM

## 2018-02-10 DIAGNOSIS — M25551 Pain in right hip: Secondary | ICD-10-CM | POA: Insufficient documentation

## 2018-02-10 LAB — BASIC METABOLIC PANEL
BUN: 7 (ref 4–21)
Creatinine: 0.5 (ref 0.5–1.1)
Glucose: 93
Potassium: 3.3 — AB (ref 3.4–5.3)
Sodium: 141 (ref 137–147)

## 2018-02-10 NOTE — Progress Notes (Signed)
   Post-Op Visit Note   Patient: Erica Vasquez           Date of Birth: [birth year]           MRN: [id number] Visit Date: 02/10/2018 PCP: Mirna MiresHill, [name], MD   Assessment & Plan:  Chief Complaint:  Chief Complaint  Patient presents with  . Right Hip - Pain   Visit Diagnoses:  1. Status post right hip replacement     Plan: Patient comes in today with her son.  She is 15 days status post right hip hemiarthroplasty, date of surgery 01/26/2018.  She has been in a skilled nursing facility during this postoperative period.  She has been working with therapy but is not been ambulating as she was prior to the fall.  No fevers, chills or any other systemic symptoms.  Minimal pain.  Examination of the right hip reveals well-healing surgical incision with nylon sutures in place.  No evidence of infection or cellulitis.  Today, we removed the nylon sutures.  At this point, we will have her continue with therapy at the skilled nursing facility.  She will likely be discharged to her son's house at the end of the month.  I have given her a prescription for outpatient physical therapy should she continue to need this once she is discharged home.  She will follow-up with us in 4 weeks time for repeat evaluation.  Call with concerns or questions in the meantime.  Follow-Up Instructions: Return in about 1 month (around 03/10/2018).   Orders:  Orders Placed This Encounter  Procedures  . XR HIP UNILAT W OR W/O PELVIS 2-3 VIEWS RIGHT   No orders of the defined types were placed in this encounter.   Imaging: Xr Hip Unilat W Or W/o Pelvis 2-3 Views Right  Result Date: 02/10/2018 X-rays show a well-seated prosthesis   PMFS History: Patient Active Problem List   Diagnosis Date Noted  . Pain in right hip 02/10/2018  . Anemia 02/04/2018  . Constipation due to opioid therapy 01/31/2018  . Alzheimer disease 01/31/2018  . Malnutrition of moderate degree 01/27/2018  . Fall 01/25/2018  . Fracture of  femoral neck, right, closed (HCC) 01/25/2018  . Blindness 01/25/2018  . Hypokalemia 01/25/2018  . Lytic bone lesion of hip 01/25/2018  . Essential hypertension, benign    Past Medical History:  Diagnosis Date  . Alzheimer disease   . Blind   . Dementia   . Essential hypertension     Family History  Problem Relation Age of Onset  . Dementia Mother   . Dementia Father   . Glaucoma Father     Past Surgical History:  Procedure Laterality Date  . ABDOMINAL HYSTERECTOMY    . CATARACT EXTRACTION    . TOTAL HIP ARTHROPLASTY Right 01/26/2018   Procedure: TOTAL HIP ARTHROPLASTY ANTERIOR APPROACH;  Surgeon: Tarry KosXu, Naiping M, MD;  Location: [facility] OR;  Service: Orthopedics;  Laterality: Right;   Social History   Occupational History  . Not on file  Tobacco Use  . Smoking status: Never Smoker  . Smokeless tobacco: Never Used  Substance and Sexual Activity  . Alcohol use: Not Currently  . Drug use: Not Currently  . Sexual activity: Not on file

## 2018-02-25 ENCOUNTER — Non-Acute Institutional Stay (SKILLED_NURSING_FACILITY): Payer: Medicare Other | Admitting: Internal Medicine

## 2018-02-25 ENCOUNTER — Encounter: Payer: Self-pay | Admitting: Internal Medicine

## 2018-02-25 DIAGNOSIS — W19XXXD Unspecified fall, subsequent encounter: Secondary | ICD-10-CM

## 2018-02-25 DIAGNOSIS — D649 Anemia, unspecified: Secondary | ICD-10-CM

## 2018-02-25 DIAGNOSIS — F0280 Dementia in other diseases classified elsewhere without behavioral disturbance: Secondary | ICD-10-CM | POA: Diagnosis not present

## 2018-02-25 DIAGNOSIS — S72001S Fracture of unspecified part of neck of right femur, sequela: Secondary | ICD-10-CM | POA: Diagnosis not present

## 2018-02-25 DIAGNOSIS — M898X5 Other specified disorders of bone, thigh: Secondary | ICD-10-CM | POA: Diagnosis not present

## 2018-02-25 DIAGNOSIS — G301 Alzheimer's disease with late onset: Secondary | ICD-10-CM

## 2018-02-25 DIAGNOSIS — I10 Essential (primary) hypertension: Secondary | ICD-10-CM | POA: Diagnosis not present

## 2018-02-25 NOTE — Progress Notes (Signed)
Location:  [facility] Room Number: 103P Place of Service:  SNF (31) Randon Goldsmith. Lyn Hollingshead, MD  PCP: Mirna Mires, MD Patient Care Team: Mirna Mires, MD as PCP - General (Family Medicine)  Extended Emergency Contact Information Primary Emergency Contact: MARCUS,[name] Home Phone: [phone] Mobile Phone: [phone] Relation: Niece Secondary Emergency Contact: Zamora, Colton Mobile Phone: [phone] Relation: Son  Allergies  Allergen Reactions  . Bimatoprost Itching  . Brimonidine Itching  . Timolol Itching    Chief Complaint  Patient presents with  . Discharge Note    Discharged from SNF    HPI:  84 y.o. female with hypertension not on any medications, dementia, and bilateral blindness due to glaucoma, who was brought in to [facility] emergency department after mechanical fall at home and she was found to have a right hip fracture.  Patient was admitted to the hospital from 4/6-10 where she underwent a right hip arthroplasty on 4-7.  Her prior postop course was complicated by hypertension and she was started on low-dose amlodipine for this.  Patient was admitted to skilled nursing facility for OT/PT and is now ready to be discharged to home.    Past Medical History:  Diagnosis Date  . Alzheimer disease    mild  . Arthritis   . Blind   . Cataract   . Cataract cortical, senile   . Dementia   . Essential hypertension   . Glaucoma   . Hypertension     Past Surgical History:  Procedure Laterality Date  . ABDOMINAL HYSTERECTOMY    . CATARACT EXTRACTION    . GLAUCOMA SURGERY Bilateral 2010   SLT  . TOTAL HIP ARTHROPLASTY Right 01/26/2018   Procedure: TOTAL HIP ARTHROPLASTY ANTERIOR APPROACH;  Surgeon: Tarry Kos, MD;  Location: [facility] OR;  Service: Orthopedics;  Laterality: Right;     reports that she has never smoked. She has never used smokeless tobacco. She reports that she drank alcohol. She reports that she has current or  past drug history. Social History   Socioeconomic History  . Marital status: Single    Spouse name: Not on file  . Number of children: Not on file  . Years of education: Not on file  . Highest education level: Not on file  Occupational History  . Not on file  Social Needs  . Financial resource strain: Not on file  . Food insecurity:    Worry: Not on file    Inability: Not on file  . Transportation needs:    Medical: Not on file    Non-medical: Not on file  Tobacco Use  . Smoking status: Never Smoker  . Smokeless tobacco: Never Used  Substance and Sexual Activity  . Alcohol use: Not Currently  . Drug use: Not Currently  . Sexual activity: Not on file  Lifestyle  . Physical activity:    Days per week: Not on file    Minutes per session: Not on file  . Stress: Not on file  Relationships  . Social connections:    Talks on phone: Not on file    Gets together: Not on file    Attends religious service: Not on file    Active member of club or organization: Not on file    Attends meetings of clubs or organizations: Not on file    Relationship status: Not on file  . Intimate partner violence:    Fear of current or ex partner: Not on file  Emotionally abused: Not on file    Physically abused: Not on file    Forced sexual activity: Not on file  Other Topics Concern  . Not on file  Social History Narrative  . Not on file    Pertinent  Health Maintenance Due  Topic Date Due  . DEXA SCAN  03/04/2018 (Originally 03/15/1997)  . INFLUENZA VACCINE  05/22/2018  . PNA vac Low Risk Adult  Discontinued    Medications: Allergies as of 02/25/2018      Reactions   Bimatoprost Itching   Brimonidine Itching   Timolol Itching      Medication List        Accurate as of 02/25/18  4:49 PM. Always use your most recent med list.          amLODipine 2.5 MG tablet Commonly known as:  NORVASC Take 1 tablet (2.5 mg total) by mouth daily.   enoxaparin 40 MG/0.4ML  injection Commonly known as:  LOVENOX Inject 40 mg into the skin daily.   NUTRITIONAL SUPPLEMENT Liqd Take by mouth. Medpass 90 ml with med administration TID D/T weight loss   oxyCODONE-acetaminophen 5-325 MG tablet Commonly known as:  PERCOCET Take 1-2 tablets by mouth every 4 (four) hours as needed for severe pain.   potassium chloride SA 20 MEQ tablet Commonly known as:  K-DUR,KLOR-CON Take 20 mEq by mouth daily.        Vitals:   02/25/18 0948  BP: 134/68  Pulse: 95  Resp: 20  Temp: 98 F (36.7 C)  TempSrc: Oral  SpO2: 96%  Weight: 100 lb (45.4 kg)  Height:  (1.499 m)   Body mass index is 20.2 kg/m.  Physical Exam  GENERAL APPEARANCE: Alert, conversant. No acute distress.  HEENT: Blindness. RESPIRATORY: Breathing is even, unlabored. Lung sounds are clear   CARDIOVASCULAR: Heart RRR no murmurs, rubs or gallops. No peripheral edema.  GASTROINTESTINAL: Abdomen is soft, non-tender, not distended w/ normal bowel sounds.  NEUROLOGIC: Cranial nerves 2-12 grossly intact. Moves all extremities   Labs reviewed: Basic Metabolic Panel: Recent Labs    01/26/18 0847  01/28/18 0540 01/29/18 0724 01/30/18 0250 02/03/18 02/10/18  NA 138   < > 137 139 139 141 141  K 3.3*   < > 3.7 3.2* 3.2* 3.3* 3.3*  CL 105   < > 108 105 105  --   --   CO2 23   < > 21* 24 25  --   --   GLUCOSE 120*   < > 104* 107* 95  --   --   BUN 6   < > 9 5* CREATININE 0.70   < > 0.72 0.71 0.63 0.5 0.5  CALCIUM 8.4*   < > 8.0* 8.2* 8.0*  --   --   MG 1.8  --   --   --   --   --   --    < > = values in this interval not displayed.   No results found for: [facility] Liver Function Tests: Recent Labs    01/25/18 1852  AST 26  ALT 11*  ALKPHOS 54  BILITOT 1.1  PROT 5.8*  ALBUMIN 3.4*   No results for input(s): LIPASE, AMYLASE in the last 8760 hours. No results for input(s): AMMONIA in the last 8760 hours. CBC: Recent Labs    01/28/18 0540 01/29/18 0724 01/30/18 0250  02/03/18  WBC 4.8 4.2 4.3 4.1  NEUTROABS 3.2 2.7 2.6  --  HGB 9.7* 9.6* 9.4* 9.7*  HCT 30.0* 29.3* 28.8* 29*  MCV 84.0 84.2 84.0  --   PLT 112* 127* 139* 238   Lipid No results for input(s): CHOL, HDL, LDLCALC, TRIG in the last 8760 hours. Cardiac Enzymes: No results for input(s): CKTOTAL, CKMB, CKMBINDEX, TROPONINI in the last 8760 hours. BNP: No results for input(s): BNP in the last 8760 hours. CBG: Recent Labs    01/27/18 1241  GLUCAP 133*    Procedures and Imaging Studies During Stay: Mr Hip Left W Wo Contrast  Result Date: 01/29/2018 CLINICAL DATA:  Possible left greater trochanteric lesion seen on x-ray. Recent right hip total arthroplasty for fracture. EXAM: MRI OF THE LEFT HIP WITHOUT AND WITH CONTRAST TECHNIQUE: Multiplanar, multisequence MR imaging was performed both before and after administration of intravenous contrast. CONTRAST:  8mL MULTIHANCE GADOBENATE DIMEGLUMINE 529 MG/ML IV SOLN COMPARISON:  Bilateral hip x-rays dated January 25, 2018. FINDINGS: Bones: Prior right total hip arthroplasty. There is no evidence of acute fracture, dislocation or avascular necrosis. No focal bone lesion. The visualized sacroiliac joints and symphysis pubis appear normal. Articular cartilage and labrum Articular cartilage: Scattered partial-thickness cartilage loss in the left hip without focal defect or subchondral signal abnormality identified. Labrum: There is no gross labral tear or paralabral abnormality. Joint or bursal effusion Joint effusion: No significant hip joint effusion. Bursae: No focal periarticular fluid collection. Muscles and tendons Muscles and tendons: Atrophy of the left gluteus medius and minimus muscles. The bilateral hamstring and iliopsoas tendons appear intact. The piriformis muscles are symmetric. Mild edema within the right greater than left proximal abductor musculature, nonspecific Other findings Miscellaneous: Postsurgical changes in the soft tissues about the right  hip. No large hematoma or fluid collection. The visualized internal pelvic contents appear unremarkable. Small left inguinal hernia. IMPRESSION: 1. No focal bone lesion.  No acute osseous abnormality. 2. Postsurgical changes related to recent right total hip arthroplasty. No large hematoma or fluid collection. 3. Mild left hip osteoarthritis. Electronically Signed   By: Obie Dredge M.D.   On: 01/29/2018 16:54   Xr Hip Unilat W Or W/o Pelvis 2-3 Views Right  Result Date: 02/10/2018 X-rays show a well-seated prosthesis   Assessment/Plan:   Closed fracture of neck of right femur, sequela  Essential hypertension, benign  Fall, subsequent encounter  Lytic bone lesion of hip  Anemia, unspecified type  Late onset Alzheimer's disease without behavioral disturbance   Patient is being discharged with the following home health services: OT/PT/nursing  Patient is being discharged with the following durable medical equipment: Electric hospital bed, rolling walker, 3 1 commode  Patient has been advised to f/u with their PCP in 1-2 weeks to bring them up to date on their rehab stay.  Social services at facility was responsible for arranging this appointment.  Pt was provided with a 30 day supply of prescriptions for medications and refills must be obtained from their PCP.  For controlled substances, a more limited supply may be provided adequate until PCP appointment only.  Medications have been reconciled.  Spent greater than 30 minutes;> 50% of time with patient was spent reviewing records, labs, tests and studies, counseling and developing plan of care  Randon Goldsmith. Lyn Hollingshead, MD

## 2018-03-10 ENCOUNTER — Ambulatory Visit (INDEPENDENT_AMBULATORY_CARE_PROVIDER_SITE_OTHER): Payer: Medicare Other | Admitting: Orthopaedic Surgery

## 2018-03-11 ENCOUNTER — Encounter (INDEPENDENT_AMBULATORY_CARE_PROVIDER_SITE_OTHER): Payer: Self-pay | Admitting: Orthopaedic Surgery

## 2018-03-11 ENCOUNTER — Ambulatory Visit (INDEPENDENT_AMBULATORY_CARE_PROVIDER_SITE_OTHER): Payer: Medicare Other | Admitting: Orthopaedic Surgery

## 2018-03-11 ENCOUNTER — Ambulatory Visit (INDEPENDENT_AMBULATORY_CARE_PROVIDER_SITE_OTHER): Payer: Medicare Other

## 2018-03-11 DIAGNOSIS — M25551 Pain in right hip: Secondary | ICD-10-CM

## 2018-03-11 DIAGNOSIS — G8929 Other chronic pain: Secondary | ICD-10-CM | POA: Diagnosis not present

## 2018-03-11 DIAGNOSIS — M25561 Pain in right knee: Secondary | ICD-10-CM

## 2018-03-11 NOTE — Progress Notes (Signed)
Office Visit Note   Patient: Erica Vasquez           Date of Birth: [birth year]           MRN: [id number] Visit Date: 03/11/2018              Requested by: Mirna Mires, MD [address] [place], [place] [address] PCP: Mirna Mires, MD   Assessment & Plan: Visit Diagnoses:  1. Pain in right hip   2. Chronic pain of right knee     Plan: At this point, we would like for the patient to continue with physical therapy.  It is hard to tell whether her pain is coming from her hip or her knee given that she is 6 weeks out from surgery.  I have given her samples of pen said to use on her knee.  I also given her prescription for compression socks.  She will elevate as well for the swelling to the right lower extremity.  Should her knee continue to bother her 6 weeks from now at her follow-up appointment, we may entertain injecting this with cortisone.  Follow-Up Instructions: Return in about 6 weeks (around 04/22/2018).   Orders:  Orders Placed This Encounter  Procedures  . XR KNEE 3 VIEW RIGHT  . XR HIP UNILAT W OR W/O PELVIS 2-3 VIEWS RIGHT   No orders of the defined types were placed in this encounter.     Procedures: No procedures performed   Clinical Data: No additional findings.   Subjective: Chief Complaint  Patient presents with  . Right Hip - Pain, Routine Post Op    HPI patient is a pleasant 84 year old blind female with Alzheimer's disease who comes in today 44 days status post right anterior hip hemiarthroplasty, date of surgery 01/26/2018.  She has been living with her son and receiving home health physical therapy.  Prior to her fall, she was ambulating without the use of assistive devices.  She is currently in a wheelchair and is able to only take a few steps with a walker.  Her children note that her right leg is turned in more since surgery.  They are unsure whether her right knee is having pain.  Review of Systems as detailed in HPI all others reviewed and  are negative.   Objective: Vital Signs: There were no vitals taken for this visit.  Physical Exam well-developed and well-nourished female in no acute distress.  Ortho Exam examination of the right hip has no pain with logroll.  Moderate swelling to the right lower extremity.  Calf is soft and nontender.  Negative Homans.  No skin color changes.  Unable to properly examine the right knee due to patient compliance.  Specialty Comments:  No specialty comments available.  Imaging: Xr Hip Unilat W Or W/o Pelvis 2-3 Views Right  Result Date: 03/11/2018 Well-seated prosthesis.  Xr Knee 3 View Right  Result Date: 03/11/2018 X-rays of the right knee show bone-on-bone lateral compartment with moderate changes patellofemoral compartment.    PMFS History: Patient Active Problem List   Diagnosis Date Noted  . Chronic pain of right knee 03/11/2018  . Pain in right hip 02/10/2018  . Anemia 02/04/2018  . Constipation due to opioid therapy 01/31/2018  . Alzheimer disease 01/31/2018  . Malnutrition of moderate degree 01/27/2018  . Fall 01/25/2018  . Fracture of femoral neck, right, closed (HCC) 01/25/2018  . Blindness 01/25/2018  . Hypokalemia 01/25/2018  . Lytic bone lesion of  hip 01/25/2018  . Essential hypertension, benign    Past Medical History:  Diagnosis Date  . Alzheimer disease    mild  . Arthritis   . Blind   . Cataract   . Cataract cortical, senile   . Dementia   . Essential hypertension   . Glaucoma   . Hypertension     Family History  Problem Relation Age of Onset  . Dementia Mother   . Stroke Mother   . Dementia Father   . Glaucoma Father   . Cataracts Father   . Hypertension Father   . Cataracts Sister   . Glaucoma Sister   . Hypertension Sister   . Retinal detachment Sister     Past Surgical History:  Procedure Laterality Date  . ABDOMINAL HYSTERECTOMY    . CATARACT EXTRACTION    . GLAUCOMA SURGERY Bilateral 2010   SLT  . TOTAL HIP ARTHROPLASTY  Right 01/26/2018   Procedure: TOTAL HIP ARTHROPLASTY ANTERIOR APPROACH;  Surgeon: Tarry Kos, MD;  Location: [facility] OR;  Service: Orthopedics;  Laterality: Right;   Social History   Occupational History  . Not on file  Tobacco Use  . Smoking status: Never Smoker  . Smokeless tobacco: Never Used  Substance and Sexual Activity  . Alcohol use: Not Currently  . Drug use: Not Currently  . Sexual activity: Not on file

## 2018-04-01 ENCOUNTER — Other Ambulatory Visit: Payer: Self-pay | Admitting: Internal Medicine

## 2018-04-11 ENCOUNTER — Other Ambulatory Visit: Payer: Self-pay | Admitting: Internal Medicine

## 2018-04-29 ENCOUNTER — Ambulatory Visit (INDEPENDENT_AMBULATORY_CARE_PROVIDER_SITE_OTHER): Payer: Medicare Other | Admitting: Orthopaedic Surgery

## 2018-11-22 ENCOUNTER — Inpatient Hospital Stay (HOSPITAL_COMMUNITY)
Admission: EM | Admit: 2018-11-22 | Discharge: 2018-11-29 | DRG: 470 | Disposition: A | Discharge status: Home Health | Payer: Medicare Other | Attending: Internal Medicine | Admitting: Internal Medicine

## 2018-11-22 DIAGNOSIS — M62838 Other muscle spasm: Secondary | ICD-10-CM | POA: Diagnosis present

## 2018-11-22 DIAGNOSIS — I10 Essential (primary) hypertension: Secondary | ICD-10-CM | POA: Diagnosis present

## 2018-11-22 DIAGNOSIS — Z993 Dependence on wheelchair: Secondary | ICD-10-CM

## 2018-11-22 DIAGNOSIS — Z96641 Presence of right artificial hip joint: Secondary | ICD-10-CM | POA: Diagnosis present

## 2018-11-22 DIAGNOSIS — R52 Pain, unspecified: Secondary | ICD-10-CM

## 2018-11-22 DIAGNOSIS — Y92019 Unspecified place in single-family (private) house as the place of occurrence of the external cause: Secondary | ICD-10-CM

## 2018-11-22 DIAGNOSIS — S72009A Fracture of unspecified part of neck of unspecified femur, initial encounter for closed fracture: Secondary | ICD-10-CM

## 2018-11-22 DIAGNOSIS — S72012A Unspecified intracapsular fracture of left femur, initial encounter for closed fracture: Secondary | ICD-10-CM | POA: Diagnosis not present

## 2018-11-22 DIAGNOSIS — H547 Unspecified visual loss: Secondary | ICD-10-CM | POA: Diagnosis present

## 2018-11-22 DIAGNOSIS — G309 Alzheimer's disease, unspecified: Secondary | ICD-10-CM | POA: Diagnosis present

## 2018-11-22 DIAGNOSIS — E876 Hypokalemia: Secondary | ICD-10-CM | POA: Diagnosis present

## 2018-11-22 DIAGNOSIS — Z09 Encounter for follow-up examination after completed treatment for conditions other than malignant neoplasm: Secondary | ICD-10-CM

## 2018-11-22 DIAGNOSIS — N6310 Unspecified lump in the right breast, unspecified quadrant: Secondary | ICD-10-CM | POA: Diagnosis present

## 2018-11-22 DIAGNOSIS — M25552 Pain in left hip: Secondary | ICD-10-CM | POA: Diagnosis not present

## 2018-11-22 DIAGNOSIS — Z9889 Other specified postprocedural states: Secondary | ICD-10-CM

## 2018-11-22 DIAGNOSIS — W050XXA Fall from non-moving wheelchair, initial encounter: Secondary | ICD-10-CM | POA: Diagnosis present

## 2018-11-22 DIAGNOSIS — Z90710 Acquired absence of both cervix and uterus: Secondary | ICD-10-CM

## 2018-11-22 DIAGNOSIS — F0280 Dementia in other diseases classified elsewhere without behavioral disturbance: Secondary | ICD-10-CM | POA: Diagnosis present

## 2018-11-22 DIAGNOSIS — S72002A Fracture of unspecified part of neck of left femur, initial encounter for closed fracture: Secondary | ICD-10-CM

## 2018-11-22 DIAGNOSIS — D62 Acute posthemorrhagic anemia: Secondary | ICD-10-CM | POA: Diagnosis not present

## 2018-11-22 DIAGNOSIS — W19XXXA Unspecified fall, initial encounter: Secondary | ICD-10-CM | POA: Diagnosis present

## 2018-11-22 DIAGNOSIS — Z79899 Other long term (current) drug therapy: Secondary | ICD-10-CM

## 2018-11-22 NOTE — ED Provider Notes (Signed)
[facility] EMERGENCY DEPARTMENT Provider Note   CSN: [id number] Arrival date & time: 11/22/18  2333     History   Chief Complaint Chief Complaint  Patient presents with  . Fall    HPI Erica Vasquez is a 85 y.o. female.  Patient presents to the emergency department for evaluation of fall.  Patient had a witnessed fall at home.  EMS brings her to the ER.  She is not accompanied by any family currently, information is limited.  At arrival she is clearly demented, this is reportedly her normal mental baseline as per EMS.  She has no complaints. Level V Caveat due to dementia.     Past Medical History:  Diagnosis Date  . Alzheimer disease    mild  . Arthritis   . Blind   . Cataract   . Cataract cortical, senile   . Dementia   . Essential hypertension   . Glaucoma   . Hypertension     Patient Active Problem List   Diagnosis Date Noted  . Chronic pain of right knee 03/11/2018  . Pain in right hip 02/10/2018  . Anemia 02/04/2018  . Constipation due to opioid therapy 01/31/2018  . Alzheimer disease (HCC) 01/31/2018  . Malnutrition of moderate degree 01/27/2018  . Fall 01/25/2018  . Fracture of femoral neck, right, closed (HCC) 01/25/2018  . Blindness 01/25/2018  . Hypokalemia 01/25/2018  . Lytic bone lesion of hip 01/25/2018  . Essential hypertension, benign     Past Surgical History:  Procedure Laterality Date  . ABDOMINAL HYSTERECTOMY    . CATARACT EXTRACTION    . GLAUCOMA SURGERY Bilateral 2010   SLT  . TOTAL HIP ARTHROPLASTY Right 01/26/2018   Procedure: TOTAL HIP ARTHROPLASTY ANTERIOR APPROACH;  Surgeon: Tarry Kos, MD;  Location: [facility] OR;  Service: Orthopedics;  Laterality: Right;     OB History   No obstetric history on file.      Home Medications    Prior to Admission medications   Medication Sig Start Date End Date Taking? Authorizing Provider  amLODipine (NORVASC) 2.5 MG tablet Take 1 tablet (2.5 mg total) by mouth daily.  01/30/18 03/01/18  Arrien, York Ram, MD  NUTRITIONAL SUPPLEMENT LIQD Take by mouth. Medpass 90 ml with med administration TID D/T weight loss    [provider]  oxyCODONE-acetaminophen (PERCOCET) 5-325 MG tablet Take 1-2 tablets by mouth every 4 (four) hours as needed for severe pain. 01/26/18   Tarry Kos, MD  potassium chloride SA (K-DUR,KLOR-CON) 20 MEQ tablet Take 20 mEq by mouth daily.    [provider]    Family History Family History  Problem Relation Age of Onset  . Dementia Mother   . Stroke Mother   . Dementia Father   . Glaucoma Father   . Cataracts Father   . Hypertension Father   . Cataracts Sister   . Glaucoma Sister   . Hypertension Sister   . Retinal detachment Sister     Social History Social History   Tobacco Use  . Smoking status: Never Smoker  . Smokeless tobacco: Never Used  Substance Use Topics  . Alcohol use: Not Currently  . Drug use: Not Currently     Allergies   Bimatoprost; Brimonidine; and Timolol   Review of Systems Review of Systems  Unable to perform ROS: Dementia     Physical Exam Updated Vital Signs BP (!) 153/68   Pulse 98   Temp 97.7 F (36.5 C) (  Oral)   Resp 17   SpO2 98%   Physical Exam Vitals signs and nursing note reviewed.  Constitutional:      General: She is not in acute distress.    Appearance: Normal appearance. She is well-developed.  HENT:     Head: Normocephalic and atraumatic.     Right Ear: Hearing normal.     Left Ear: Hearing normal.     Nose: Nose normal.  Eyes:     Conjunctiva/sclera: Conjunctivae normal.     Pupils: Pupils are equal, round, and reactive to light.  Neck:     Musculoskeletal: Normal range of motion and neck supple.  Cardiovascular:     Rate and Rhythm: Regular rhythm.     Pulses:          Dorsalis pedis pulses are 1+ on the right side and 1+ on the left side.     Heart sounds: S1 normal and S2 normal. No murmur. No friction rub. No gallop.   Pulmonary:      Effort: Pulmonary effort is normal. No respiratory distress.     Breath sounds: Normal breath sounds.  Chest:     Chest wall: No tenderness.  Abdominal:     General: Bowel sounds are normal.     Palpations: Abdomen is soft.     Tenderness: There is no abdominal tenderness. There is no guarding or rebound. Negative signs include Murphy's sign and McBurney's sign.     Hernia: No hernia is present.  Musculoskeletal:     Left hip: She exhibits decreased range of motion and deformity (Externally rotated).  Skin:    General: Skin is warm and dry.     Findings: No rash.  Neurological:     Mental Status: She is alert. She is disoriented.     GCS: GCS eye subscore is 4. GCS verbal subscore is 4. GCS motor subscore is 6.     Cranial Nerves: No cranial nerve deficit.     Sensory: No sensory deficit.     Coordination: Coordination normal.  Psychiatric:        Speech: Speech normal.      ED Treatments / Results  Labs (all labs ordered are listed, but only abnormal results are displayed) Labs Reviewed  CBC WITH DIFFERENTIAL/PLATELET - Abnormal; Notable for the following components:      Result Value   Hemoglobin 11.8 (*)    All other components within normal limits  COMPREHENSIVE METABOLIC PANEL - Abnormal; Notable for the following components:   CO2 20 (*)    Glucose, Bld 112 (*)    Calcium 8.8 (*)    Total Protein 5.6 (*)    Albumin 3.4 (*)    Anion gap 17 (*)    All other components within normal limits  URINALYSIS, ROUTINE W REFLEX MICROSCOPIC - Abnormal; Notable for the following components:   Hgb urine dipstick SMALL (*)    Ketones, ur 5 (*)    All other components within normal limits  TROPONIN I  PROTIME-INR    EKG EKG Interpretation  Date/Time:  Sunday November 23 2018 01:20:10 EST Ventricular Rate:  89 PR Interval:    QRS Duration: 90 QT Interval:  381 QTC Calculation: 464 R Axis:   -45 Text Interpretation:  Sinus rhythm Left anterior fascicular block RSR'  in V1 or V2, right VCD or RVH Probable LVH with secondary repol abnrm No significant change since last tracing Confirmed by Gilda Crease [phone]) on 11/23/2018 2:37:55 AM  Radiology Dg Chest 1 View  Result Date: 11/23/2018 CLINICAL DATA:  Fall.  Hip deformity. EXAM: CHEST  1 VIEW COMPARISON:  None. FINDINGS: Cardiac silhouette is mildly enlarged, accentuated by rotation LEFT. Tortuous calcified aorta. No pleural effusion. Patchy RIGHT perihilar airspace opacity. Elevated RIGHT hemidiaphragm. Strandy densities LEFT lung base. No pneumothorax. Osteopenia. IMPRESSION: 1. Patchy RIGHT perihilar airspace opacity versus projectional artifact. LEFT lung base atelectasis/scar. Recommend follow-up PA and lateral views of the chest when clinically able. 2. Mild cardiomegaly. 3. Aortic Atherosclerosis (ICD10-I70.0). Electronically Signed   By: Awilda Metroourtnay  Bloomer M.D.   On: 11/23/2018 01:22   Dg Thoracic Spine 2 View  Result Date: 11/23/2018 CLINICAL DATA:  Pain after a fall. EXAM: THORACIC SPINE 2 VIEWS COMPARISON:  None. FINDINGS: Thoracic scoliosis convex towards the right. Diffuse bone demineralization. No anterior subluxations. Diffuse degenerative changes with narrowed interspaces and endplate hypertrophic change. No paraspinal soft tissue swelling. No focal bone lesion or bone destruction. IMPRESSION: Degenerative changes and scoliosis of the thoracic spine. No acute displaced fractures identified. Electronically Signed   By: Burman NievesWilliam  Stevens M.D.   On: 11/23/2018 01:27   Dg Lumbar Spine 2-3 Views  Result Date: 11/23/2018 CLINICAL DATA:  Pain after a fall. EXAM: LUMBAR SPINE - 2-3 VIEW COMPARISON:  None. FINDINGS: Diffuse bone demineralization. Lumbar scoliosis convex towards the right. Diffuse degenerative changes with narrowed interspaces and endplate hypertrophic changes throughout. Anterior compression of the L1 vertebra with about 50% loss of height anteriorly. Suggestion of mild retropulsion of  fracture fragments. No prior comparison studies are available in chronicity is indeterminate. Visualized sacrum appears intact. IMPRESSION: Age-indeterminate anterior compression of the L1 vertebra. Degenerative changes and scoliosis of the lumbar spine. Electronically Signed   By: Burman NievesWilliam  Stevens M.D.   On: 11/23/2018 01:26   Ct Head Wo Contrast  Result Date: 11/23/2018 CLINICAL DATA:  Patient fell out of wheelchair landing on left hip. Dementia. EXAM: CT HEAD WITHOUT CONTRAST CT CERVICAL SPINE WITHOUT CONTRAST TECHNIQUE: Multidetector CT imaging of the head and cervical spine was performed following the standard protocol without intravenous contrast. Multiplanar CT image reconstructions of the cervical spine were also generated. COMPARISON:  01/25/2018 CT head FINDINGS: CT HEAD FINDINGS Brain: Moderate degree of sulcal and ventricular prominence, stable in appearance consistent with atrophy. Mild-to-moderate small vessel ischemic disease of periventricular and subcortical white matter. No acute intracranial hemorrhage. No intra-axial mass nor extra-axial fluid. Midline fourth ventricle basal cisterns without effacement. Vascular: Moderate atherosclerosis of the carotid siphons. No hyperdense vessel sign. Skull: Intact Sinuses/Orbits: Right cataract extraction. Intact orbits and globes. No acute sinus disease. Other: Clear mastoids. CT CERVICAL SPINE FINDINGS Alignment: Reversal of cervical lordosis attributable to multilevel degenerative disc disease. Intact craniocervical relationship. Intact atlantodental interval. Skull base and vertebrae: Intact skull base. No acute cervical spine fracture. No jumped or perched facets. Soft tissues and spinal canal: No prevertebral fluid or swelling. No visible canal hematoma. Disc levels: Mild disc flattening C2-3 with moderate-to-marked disc flattening C3 through T1. Small posterior marginal osteophytes are identified with calcified central disc-osteophyte complex at  C4-5. Bilateral uncovertebral joint osteoarthritis is seen C3 through C7. Mild bilateral C5-6 foraminal encroachment from uncinate spurring Upper chest: Ectatic aortic arch, partially imaged. The visualized lungs are nonacute. Other: None IMPRESSION: 1. Atrophy with chronic small vessel ischemic disease. No acute intracranial abnormality. 2. Cervical spondylosis without acute cervical spine fracture. Electronically Signed   By: Tollie Ethavid  Kwon M.D.   On: 11/23/2018 01:00   Ct Cervical Spine Wo Contrast  Result Date: 11/23/2018 CLINICAL DATA:  Patient fell out of wheelchair landing on left hip. Dementia. EXAM: CT HEAD WITHOUT CONTRAST CT CERVICAL SPINE WITHOUT CONTRAST TECHNIQUE: Multidetector CT imaging of the head and cervical spine was performed following the standard protocol without intravenous contrast. Multiplanar CT image reconstructions of the cervical spine were also generated. COMPARISON:  01/25/2018 CT head FINDINGS: CT HEAD FINDINGS Brain: Moderate degree of sulcal and ventricular prominence, stable in appearance consistent with atrophy. Mild-to-moderate small vessel ischemic disease of periventricular and subcortical white matter. No acute intracranial hemorrhage. No intra-axial mass nor extra-axial fluid. Midline fourth ventricle basal cisterns without effacement. Vascular: Moderate atherosclerosis of the carotid siphons. No hyperdense vessel sign. Skull: Intact Sinuses/Orbits: Right cataract extraction. Intact orbits and globes. No acute sinus disease. Other: Clear mastoids. CT CERVICAL SPINE FINDINGS Alignment: Reversal of cervical lordosis attributable to multilevel degenerative disc disease. Intact craniocervical relationship. Intact atlantodental interval. Skull base and vertebrae: Intact skull base. No acute cervical spine fracture. No jumped or perched facets. Soft tissues and spinal canal: No prevertebral fluid or swelling. No visible canal hematoma. Disc levels: Mild disc flattening C2-3 with  moderate-to-marked disc flattening C3 through T1. Small posterior marginal osteophytes are identified with calcified central disc-osteophyte complex at C4-5. Bilateral uncovertebral joint osteoarthritis is seen C3 through C7. Mild bilateral C5-6 foraminal encroachment from uncinate spurring Upper chest: Ectatic aortic arch, partially imaged. The visualized lungs are nonacute. Other: None IMPRESSION: 1. Atrophy with chronic small vessel ischemic disease. No acute intracranial abnormality. 2. Cervical spondylosis without acute cervical spine fracture. Electronically Signed   By: Tollie Eth M.D.   On: 11/23/2018 01:00   Dg Hips Bilat W Or Wo Pelvis 3-4 Views  Result Date: 11/23/2018 CLINICAL DATA:  Fall. Hip deformity. EXAM: DG HIP (WITH OR WITHOUT PELVIS) 3-4V BILAT COMPARISON:  01/25/2018 FINDINGS: Diffuse bone demineralization. Pelvis appears intact. SI joints and symphysis pubis are not displaced. Right hip arthroplasty. Components appear well seated. Acute transverse subcapital fracture of the left proximal femur with varus angulation of the fracture fragments. No definite extension into the inter trochanteric region although the greater trochanteric margin is somewhat indistinct superiorly. Degenerative changes in the left hip and lower lumbar spine. Calcified phleboliths. Large amount of stool in the rectum. IMPRESSION: 1. Acute subcapital fracture of the left proximal femur with varus angulation. 2. Previous right hip arthroplasty.  Hardware intact. Electronically Signed   By: Burman Nieves M.D.   On: 11/23/2018 01:24    Procedures Procedures (including critical care time)  Medications Ordered in ED Medications - No data to display   Initial Impression / Assessment and Plan / ED Course  I have reviewed the triage vital signs and the nursing notes.  Pertinent labs & imaging results that were available during my care of the patient were reviewed by me and considered in my medical decision  making (see chart for details).     Patient presents to the emergency department for evaluation after a fall.  [name] is present in the ER, he is her caregiver.  He reports that she was sitting in a wheelchair and tried to get up on her own.  She does not normally walk without assistance.  This caused her fall.  Patient found to have a subcapital fracture of the left hip.  Remainder of her work-up was unremarkable.  Discussed with Dr. Ophelia Charter of [facility] orthopedics.  He will see her in the morning.  Admit to hospitalist service.  Final Clinical Impressions(s) / ED  Diagnoses   Final diagnoses:  Closed fracture of left hip, initial encounter [facility](HCC)    ED Discharge Orders    None       , Canary Brimhristopher J, MD 11/23/18 [id number]

## 2018-11-22 NOTE — ED Triage Notes (Signed)
BIB EMS from home, pt fell out of wheelchair approx 1930 landing on L hip. VSS, CBG 128. Pt is A/O to baseline, has a hx of dementia. Given 25 Fentanyl en route.

## 2018-11-23 ENCOUNTER — Inpatient Hospital Stay (HOSPITAL_COMMUNITY): Payer: Medicare Other | Admitting: Certified Registered Nurse Anesthetist

## 2018-11-23 ENCOUNTER — Encounter (HOSPITAL_COMMUNITY)
Admission: EM | Disposition: A | Discharge status: Home Health | Payer: Self-pay | Source: Home / Self Care | Attending: Internal Medicine

## 2018-11-23 ENCOUNTER — Encounter (HOSPITAL_COMMUNITY): Payer: Self-pay | Admitting: Certified Registered Nurse Anesthetist

## 2018-11-23 ENCOUNTER — Emergency Department (HOSPITAL_COMMUNITY): Payer: Medicare Other

## 2018-11-23 ENCOUNTER — Inpatient Hospital Stay (HOSPITAL_COMMUNITY): Payer: Medicare Other

## 2018-11-23 DIAGNOSIS — W19XXXA Unspecified fall, initial encounter: Secondary | ICD-10-CM | POA: Diagnosis not present

## 2018-11-23 DIAGNOSIS — E876 Hypokalemia: Secondary | ICD-10-CM

## 2018-11-23 DIAGNOSIS — Z9889 Other specified postprocedural states: Secondary | ICD-10-CM | POA: Diagnosis not present

## 2018-11-23 DIAGNOSIS — S72002A Fracture of unspecified part of neck of left femur, initial encounter for closed fracture: Secondary | ICD-10-CM | POA: Diagnosis not present

## 2018-11-23 DIAGNOSIS — W19XXXD Unspecified fall, subsequent encounter: Secondary | ICD-10-CM | POA: Diagnosis not present

## 2018-11-23 DIAGNOSIS — I10 Essential (primary) hypertension: Secondary | ICD-10-CM | POA: Diagnosis present

## 2018-11-23 DIAGNOSIS — S72009S Fracture of unspecified part of neck of unspecified femur, sequela: Secondary | ICD-10-CM

## 2018-11-23 DIAGNOSIS — H547 Unspecified visual loss: Secondary | ICD-10-CM | POA: Diagnosis present

## 2018-11-23 DIAGNOSIS — Y92019 Unspecified place in single-family (private) house as the place of occurrence of the external cause: Secondary | ICD-10-CM | POA: Diagnosis not present

## 2018-11-23 DIAGNOSIS — Z79899 Other long term (current) drug therapy: Secondary | ICD-10-CM | POA: Diagnosis not present

## 2018-11-23 DIAGNOSIS — G301 Alzheimer's disease with late onset: Secondary | ICD-10-CM | POA: Diagnosis not present

## 2018-11-23 DIAGNOSIS — F0280 Dementia in other diseases classified elsewhere without behavioral disturbance: Secondary | ICD-10-CM

## 2018-11-23 DIAGNOSIS — G309 Alzheimer's disease, unspecified: Secondary | ICD-10-CM | POA: Diagnosis present

## 2018-11-23 DIAGNOSIS — M25552 Pain in left hip: Secondary | ICD-10-CM | POA: Diagnosis present

## 2018-11-23 DIAGNOSIS — S72012A Unspecified intracapsular fracture of left femur, initial encounter for closed fracture: Secondary | ICD-10-CM | POA: Diagnosis present

## 2018-11-23 DIAGNOSIS — M62838 Other muscle spasm: Secondary | ICD-10-CM | POA: Diagnosis present

## 2018-11-23 DIAGNOSIS — Z993 Dependence on wheelchair: Secondary | ICD-10-CM | POA: Diagnosis not present

## 2018-11-23 DIAGNOSIS — Z96641 Presence of right artificial hip joint: Secondary | ICD-10-CM | POA: Diagnosis present

## 2018-11-23 DIAGNOSIS — H543 Unqualified visual loss, both eyes: Secondary | ICD-10-CM | POA: Diagnosis not present

## 2018-11-23 DIAGNOSIS — W050XXA Fall from non-moving wheelchair, initial encounter: Secondary | ICD-10-CM | POA: Diagnosis present

## 2018-11-23 DIAGNOSIS — Z90710 Acquired absence of both cervix and uterus: Secondary | ICD-10-CM | POA: Diagnosis not present

## 2018-11-23 DIAGNOSIS — N6310 Unspecified lump in the right breast, unspecified quadrant: Secondary | ICD-10-CM | POA: Diagnosis present

## 2018-11-23 DIAGNOSIS — S72009A Fracture of unspecified part of neck of unspecified femur, initial encounter for closed fracture: Secondary | ICD-10-CM | POA: Diagnosis present

## 2018-11-23 DIAGNOSIS — D62 Acute posthemorrhagic anemia: Secondary | ICD-10-CM | POA: Diagnosis not present

## 2018-11-23 HISTORY — PX: HIP ARTHROPLASTY: SHX981

## 2018-11-23 LAB — PROTIME-INR
INR: 1.01
Prothrombin Time: 13.2 seconds (ref 11.4–15.2)

## 2018-11-23 LAB — URINALYSIS, ROUTINE W REFLEX MICROSCOPIC
BILIRUBIN URINE: NEGATIVE
Bacteria, UA: NONE SEEN
Glucose, UA: NEGATIVE mg/dL
Ketones, ur: 5 mg/dL — AB
Leukocytes, UA: NEGATIVE
Nitrite: NEGATIVE
Protein, ur: NEGATIVE mg/dL
Specific Gravity, Urine: 1.015 (ref 1.005–1.030)
pH: 7 (ref 5.0–8.0)

## 2018-11-23 LAB — COMPREHENSIVE METABOLIC PANEL
ALBUMIN: 3.4 g/dL — AB (ref 3.5–5.0)
ALT: 10 U/L (ref 0–44)
AST: 29 U/L (ref 15–41)
Alkaline Phosphatase: 42 U/L (ref 38–126)
Anion gap: 17 — ABNORMAL HIGH (ref 5–15)
BUN: 12 mg/dL (ref 8–23)
CO2: 20 mmol/L — ABNORMAL LOW (ref 22–32)
Calcium: 8.8 mg/dL — ABNORMAL LOW (ref 8.9–10.3)
Chloride: 103 mmol/L (ref 98–111)
Creatinine, Ser: 0.81 mg/dL (ref 0.44–1.00)
GFR calc Af Amer: >60 mL/min (ref >60)
GFR calc non Af Amer: >60 mL/min (ref >60)
GLUCOSE: 112 mg/dL — AB (ref 70–99)
Potassium: 4.1 mmol/L (ref 3.5–5.1)
Sodium: 140 mmol/L (ref 135–145)
Total Bilirubin: 1.2 mg/dL (ref 0.3–1.2)
Total Protein: 5.6 g/dL — ABNORMAL LOW (ref 6.5–8.1)

## 2018-11-23 LAB — CBC WITH DIFFERENTIAL/PLATELET
Abs Immature Granulocytes: 0.03 10*3/uL (ref 0.00–0.07)
Basophils Absolute: 0 10*3/uL (ref 0.0–0.1)
Basophils Relative: 1 %
Eosinophils Absolute: 0 10*3/uL (ref 0.0–0.5)
Eosinophils Relative: 1 %
HCT: 38.3 % (ref 36.0–46.0)
Hemoglobin: 11.8 g/dL — ABNORMAL LOW (ref 12.0–15.0)
Immature Granulocytes: 1 %
Lymphocytes Relative: 16 %
Lymphs Abs: 1 10*3/uL (ref 0.7–4.0)
MCH: 26.8 pg (ref 26.0–34.0)
MCHC: 30.8 g/dL (ref 30.0–36.0)
MCV: 86.8 fL (ref 80.0–100.0)
MONO ABS: 0.5 10*3/uL (ref 0.1–1.0)
Monocytes Relative: 7 %
Neutro Abs: 4.9 10*3/uL (ref 1.7–7.7)
Neutrophils Relative %: 74 %
Platelets: 190 10*3/uL (ref 150–400)
RBC: 4.41 MIL/uL (ref 3.87–5.11)
RDW: 14.5 % (ref 11.5–15.5)
WBC: 6.4 10*3/uL (ref 4.0–10.5)
nRBC: 0 % (ref 0.0–0.2)

## 2018-11-23 LAB — SURGICAL PCR SCREEN
MRSA, PCR: NEGATIVE
Staphylococcus aureus: NEGATIVE

## 2018-11-23 LAB — CBC
HCT: 36 % (ref 36.0–46.0)
Hemoglobin: 11.3 g/dL — ABNORMAL LOW (ref 12.0–15.0)
MCH: 27 pg (ref 26.0–34.0)
MCHC: 31.4 g/dL (ref 30.0–36.0)
MCV: 86.1 fL (ref 80.0–100.0)
NRBC: 0 % (ref 0.0–0.2)
Platelets: 155 10*3/uL (ref 150–400)
RBC: 4.18 MIL/uL (ref 3.87–5.11)
RDW: 14.5 % (ref 11.5–15.5)
WBC: 5.6 10*3/uL (ref 4.0–10.5)

## 2018-11-23 LAB — BASIC METABOLIC PANEL
ANION GAP: 10 (ref 5–15)
BUN: 8 mg/dL (ref 8–23)
CO2: 24 mmol/L (ref 22–32)
Calcium: 8.8 mg/dL — ABNORMAL LOW (ref 8.9–10.3)
Chloride: 107 mmol/L (ref 98–111)
Creatinine, Ser: 0.65 mg/dL (ref 0.44–1.00)
GFR calc Af Amer: >60 mL/min (ref >60)
GFR calc non Af Amer: >60 mL/min (ref >60)
Glucose, Bld: 129 mg/dL — ABNORMAL HIGH (ref 70–99)
Potassium: 3.3 mmol/L — ABNORMAL LOW (ref 3.5–5.1)
Sodium: 141 mmol/L (ref 135–145)

## 2018-11-23 LAB — TROPONIN I: Troponin I: <0.03 ng/mL (ref <0.03)

## 2018-11-23 SURGERY — HEMIARTHROPLASTY, HIP, DIRECT ANTERIOR APPROACH, FOR FRACTURE
Anesthesia: General | Laterality: Left

## 2018-11-23 MED ORDER — METOCLOPRAMIDE HCL 5 MG/ML IJ SOLN: 5.0000 mg | Freq: Three times a day (TID) | INTRAMUSCULAR | Status: DC | PRN

## 2018-11-23 MED ORDER — FENTANYL CITRATE (PF) 250 MCG/5ML IJ SOLN
INTRAMUSCULAR | Status: AC
  Filled 2018-11-23: qty 5

## 2018-11-23 MED ORDER — PANTOPRAZOLE SODIUM 40 MG PO TBEC
40.0000 mg | DELAYED_RELEASE_TABLET | Freq: Every day | ORAL | Status: DC
  Administered 2018-11-24 – 2018-11-28 (×5): 40 mg via ORAL
  Filled 2018-11-23 (×5): qty 1

## 2018-11-23 MED ORDER — LIDOCAINE 2% (20 MG/ML) 5 ML SYRINGE
INTRAMUSCULAR | Status: AC
  Filled 2018-11-23: qty 5

## 2018-11-23 MED ORDER — MORPHINE SULFATE (PF) 2 MG/ML IV SOLN: 0.5000 mg | INTRAVENOUS | Status: DC | PRN

## 2018-11-23 MED ORDER — PHENOL 1.4 % MT LIQD: 1.0000 | OROMUCOSAL | Status: DC | PRN

## 2018-11-23 MED ORDER — LACTATED RINGERS IV SOLN
INTRAVENOUS | Status: DC | PRN
  Administered 2018-11-23 (×2): via INTRAVENOUS

## 2018-11-23 MED ORDER — POTASSIUM CHLORIDE 20 MEQ PO PACK
40.0000 meq | PACK | Freq: Once | ORAL | Status: DC
  Filled 2018-11-23: qty 2

## 2018-11-23 MED ORDER — ONDANSETRON HCL 4 MG/2ML IJ SOLN: 4.0000 mg | Freq: Four times a day (QID) | INTRAMUSCULAR | Status: DC | PRN

## 2018-11-23 MED ORDER — ACETAMINOPHEN 500 MG PO TABS
500.0000 mg | ORAL_TABLET | Freq: Four times a day (QID) | ORAL | Status: AC
  Filled 2018-11-23: qty 1

## 2018-11-23 MED ORDER — BUPIVACAINE HCL (PF) 0.25 % IJ SOLN
INTRAMUSCULAR | Status: AC
  Filled 2018-11-23: qty 30

## 2018-11-23 MED ORDER — MUPIROCIN 2 % EX OINT: 1.0000 "application " | TOPICAL_OINTMENT | Freq: Two times a day (BID) | CUTANEOUS | Status: DC

## 2018-11-23 MED ORDER — CEFAZOLIN SODIUM-DEXTROSE 2-4 GM/100ML-% IV SOLN
2.0000 g | INTRAVENOUS | Status: AC
  Administered 2018-11-24: 2 g via INTRAVENOUS
  Filled 2018-11-23: qty 100

## 2018-11-23 MED ORDER — PROPOFOL 10 MG/ML IV BOLUS
INTRAVENOUS | Status: AC
  Filled 2018-11-23: qty 20

## 2018-11-23 MED ORDER — PROPOFOL 10 MG/ML IV BOLUS
INTRAVENOUS | Status: DC | PRN
  Administered 2018-11-23: 80 mg via INTRAVENOUS

## 2018-11-23 MED ORDER — CEFAZOLIN SODIUM-DEXTROSE 2-4 GM/100ML-% IV SOLN
INTRAVENOUS | Status: AC
  Filled 2018-11-23: qty 100

## 2018-11-23 MED ORDER — POTASSIUM CHLORIDE IN NACL 20-0.45 MEQ/L-% IV SOLN
INTRAVENOUS | Status: DC
  Administered 2018-11-23: 75 mL via INTRAVENOUS
  Filled 2018-11-23 (×2): qty 1000

## 2018-11-23 MED ORDER — TRAMADOL HCL 50 MG PO TABS
50.0000 mg | ORAL_TABLET | Freq: Four times a day (QID) | ORAL | Status: DC
  Administered 2018-11-23 – 2018-11-28 (×15): 50 mg via ORAL
  Filled 2018-11-23 (×15): qty 1

## 2018-11-23 MED ORDER — FENTANYL CITRATE (PF) 100 MCG/2ML IJ SOLN
INTRAMUSCULAR | Status: DC | PRN
  Administered 2018-11-23 (×3): 50 ug via INTRAVENOUS

## 2018-11-23 MED ORDER — ASPIRIN EC 325 MG PO TBEC
325.0000 mg | DELAYED_RELEASE_TABLET | Freq: Every day | ORAL | Status: DC
  Administered 2018-11-24 – 2018-11-28 (×5): 325 mg via ORAL
  Filled 2018-11-23 (×5): qty 1

## 2018-11-23 MED ORDER — ROCURONIUM BROMIDE 50 MG/5ML IV SOSY
PREFILLED_SYRINGE | INTRAVENOUS | Status: AC
  Filled 2018-11-23: qty 5

## 2018-11-23 MED ORDER — SUGAMMADEX SODIUM 200 MG/2ML IV SOLN
INTRAVENOUS | Status: DC | PRN
  Administered 2018-11-23: 150 mg via INTRAVENOUS

## 2018-11-23 MED ORDER — ONDANSETRON HCL 4 MG/2ML IJ SOLN
INTRAMUSCULAR | Status: DC | PRN
  Administered 2018-11-23: 4 mg via INTRAVENOUS

## 2018-11-23 MED ORDER — CHLORHEXIDINE GLUCONATE 4 % EX LIQD: 60.0000 mL | Freq: Once | CUTANEOUS | Status: DC

## 2018-11-23 MED ORDER — SODIUM CHLORIDE 0.9 % IV SOLN
INTRAVENOUS | Status: DC
  Administered 2018-11-23: 06:00:00 via INTRAVENOUS

## 2018-11-23 MED ORDER — METHOCARBAMOL 1000 MG/10ML IJ SOLN
500.0000 mg | Freq: Four times a day (QID) | INTRAVENOUS | Status: DC | PRN
  Filled 2018-11-23: qty 5

## 2018-11-23 MED ORDER — ENOXAPARIN SODIUM 40 MG/0.4ML ~~LOC~~ SOLN
40.0000 mg | SUBCUTANEOUS | Status: DC
  Administered 2018-11-23 – 2018-11-28 (×5): 40 mg via SUBCUTANEOUS
  Filled 2018-11-23 (×4): qty 0.4

## 2018-11-23 MED ORDER — CEFAZOLIN SODIUM-DEXTROSE 2-3 GM-%(50ML) IV SOLR
INTRAVENOUS | Status: DC | PRN
  Administered 2018-11-23: 2 g via INTRAVENOUS

## 2018-11-23 MED ORDER — METOCLOPRAMIDE HCL 5 MG PO TABS: 5.0000 mg | ORAL_TABLET | Freq: Three times a day (TID) | ORAL | Status: DC | PRN

## 2018-11-23 MED ORDER — OXYCODONE HCL 5 MG/5ML PO SOLN: 5.0000 mg | Freq: Once | ORAL | Status: DC | PRN

## 2018-11-23 MED ORDER — ENOXAPARIN SODIUM 40 MG/0.4ML ~~LOC~~ SOLN: 40.0000 mg | SUBCUTANEOUS | Status: DC

## 2018-11-23 MED ORDER — PHENYLEPHRINE 40 MCG/ML (10ML) SYRINGE FOR IV PUSH (FOR BLOOD PRESSURE SUPPORT)
PREFILLED_SYRINGE | INTRAVENOUS | Status: AC
  Filled 2018-11-23: qty 10

## 2018-11-23 MED ORDER — POVIDONE-IODINE 10 % EX SWAB: 2.0000 "application " | Freq: Once | CUTANEOUS | Status: DC

## 2018-11-23 MED ORDER — ONDANSETRON HCL 4 MG/2ML IJ SOLN
INTRAMUSCULAR | Status: AC
  Filled 2018-11-23: qty 2

## 2018-11-23 MED ORDER — CELECOXIB 200 MG PO CAPS
200.0000 mg | ORAL_CAPSULE | Freq: Two times a day (BID) | ORAL | Status: DC
  Administered 2018-11-23 – 2018-11-28 (×11): 200 mg via ORAL
  Filled 2018-11-23 (×11): qty 1

## 2018-11-23 MED ORDER — DEXAMETHASONE SODIUM PHOSPHATE 10 MG/ML IJ SOLN
INTRAMUSCULAR | Status: AC
  Filled 2018-11-23: qty 1

## 2018-11-23 MED ORDER — BUPIVACAINE HCL (PF) 0.25 % IJ SOLN
INTRAMUSCULAR | Status: DC | PRN
  Administered 2018-11-23: 15 mL

## 2018-11-23 MED ORDER — ONDANSETRON HCL 4 MG PO TABS: 4.0000 mg | ORAL_TABLET | Freq: Four times a day (QID) | ORAL | Status: DC | PRN

## 2018-11-23 MED ORDER — MENTHOL 3 MG MT LOZG: 1.0000 | LOZENGE | OROMUCOSAL | Status: DC | PRN

## 2018-11-23 MED ORDER — LIDOCAINE 2% (20 MG/ML) 5 ML SYRINGE
INTRAMUSCULAR | Status: DC | PRN
  Administered 2018-11-23: 60 mg via INTRAVENOUS

## 2018-11-23 MED ORDER — CEFAZOLIN SODIUM-DEXTROSE 2-4 GM/100ML-% IV SOLN
2.0000 g | Freq: Four times a day (QID) | INTRAVENOUS | Status: AC
  Administered 2018-11-23: 2 g via INTRAVENOUS
  Filled 2018-11-23 (×2): qty 100

## 2018-11-23 MED ORDER — ROCURONIUM BROMIDE 50 MG/5ML IV SOSY
PREFILLED_SYRINGE | INTRAVENOUS | Status: DC | PRN
  Administered 2018-11-23: 40 mg via INTRAVENOUS

## 2018-11-23 MED ORDER — ALBUTEROL SULFATE (2.5 MG/3ML) 0.083% IN NEBU
2.5000 mg | INHALATION_SOLUTION | Freq: Four times a day (QID) | RESPIRATORY_TRACT | Status: DC | PRN
  Administered 2018-11-23: 2.5 mg via RESPIRATORY_TRACT

## 2018-11-23 MED ORDER — ARTIFICIAL TEARS OPHTHALMIC OINT
TOPICAL_OINTMENT | OPHTHALMIC | Status: AC
  Filled 2018-11-23: qty 3.5

## 2018-11-23 MED ORDER — METHOCARBAMOL 500 MG PO TABS
500.0000 mg | ORAL_TABLET | Freq: Four times a day (QID) | ORAL | Status: DC | PRN
  Filled 2018-11-23: qty 1

## 2018-11-23 MED ORDER — SODIUM CHLORIDE 0.9 % IV SOLN
INTRAVENOUS | Status: DC | PRN
  Administered 2018-11-23: 15 ug/min via INTRAVENOUS

## 2018-11-23 MED ORDER — AMLODIPINE BESYLATE 2.5 MG PO TABS
2.5000 mg | ORAL_TABLET | Freq: Every day | ORAL | Status: DC
  Administered 2018-11-24 – 2018-11-28 (×5): 2.5 mg via ORAL
  Filled 2018-11-23 (×5): qty 1

## 2018-11-23 MED ORDER — HYDROCODONE-ACETAMINOPHEN 5-325 MG PO TABS: 1.0000 | ORAL_TABLET | Freq: Four times a day (QID) | ORAL | Status: DC | PRN

## 2018-11-23 MED ORDER — HYDROCODONE-ACETAMINOPHEN 5-325 MG PO TABS
1.0000 | ORAL_TABLET | ORAL | Status: DC | PRN
  Administered 2018-11-24: 1 via ORAL
  Filled 2018-11-23 (×2): qty 1

## 2018-11-23 MED ORDER — 0.9 % SODIUM CHLORIDE (POUR BTL) OPTIME
TOPICAL | Status: DC | PRN
  Administered 2018-11-23: 1000 mL

## 2018-11-23 MED ORDER — DEXTROSE-NACL 5-0.9 % IV SOLN: INTRAVENOUS | Status: DC

## 2018-11-23 MED ORDER — ALBUTEROL SULFATE (2.5 MG/3ML) 0.083% IN NEBU
INHALATION_SOLUTION | RESPIRATORY_TRACT | Status: AC
  Administered 2018-11-23: 2.5 mg via RESPIRATORY_TRACT
  Filled 2018-11-23: qty 3

## 2018-11-23 MED ORDER — DEXAMETHASONE SODIUM PHOSPHATE 10 MG/ML IJ SOLN
INTRAMUSCULAR | Status: DC | PRN
  Administered 2018-11-23: 4 mg via INTRAVENOUS

## 2018-11-23 MED ORDER — FENTANYL CITRATE (PF) 100 MCG/2ML IJ SOLN: 25.0000 ug | INTRAMUSCULAR | Status: DC | PRN

## 2018-11-23 MED ORDER — DOCUSATE SODIUM 100 MG PO CAPS
100.0000 mg | ORAL_CAPSULE | Freq: Two times a day (BID) | ORAL | Status: DC
  Administered 2018-11-25 – 2018-11-28 (×7): 100 mg via ORAL
  Filled 2018-11-23 (×7): qty 1

## 2018-11-23 MED ORDER — ACETAMINOPHEN 325 MG PO TABS: 325.0000 mg | ORAL_TABLET | Freq: Four times a day (QID) | ORAL | Status: DC | PRN

## 2018-11-23 MED ORDER — OXYCODONE HCL 5 MG PO TABS: 5.0000 mg | ORAL_TABLET | Freq: Once | ORAL | Status: DC | PRN

## 2018-11-23 SURGICAL SUPPLY — 55 items
BALL HIP ARTICU 28 +5 (Hips) IMPLANT
BIPOLAR AML DEPUY 42 (Hips) ×2 IMPLANT
BIPOLAR AML DEPUY 42MM (Hips) ×1 IMPLANT
BLADE SAW SAG 73X25 THK (BLADE) ×1
BLADE SAW SGTL 73X25 THK (BLADE) ×2 IMPLANT
BRUSH FEMORAL CANAL (MISCELLANEOUS) ×2 IMPLANT
CEMENT BONE DEPUY (Cement) ×4 IMPLANT
CEMENT RESTRICTOR DEPUY SZ 3 (Cement) ×2 IMPLANT
CEMENTRALIZER 10.0MM (Orthopedic Implant) ×2 IMPLANT
DECANTER SPIKE VIAL GLASS SM (MISCELLANEOUS) ×2 IMPLANT
DRAPE IMP U-DRAPE 54X76 (DRAPES) ×3 IMPLANT
DRAPE INCISE IOBAN 66X45 STRL (DRAPES) ×2 IMPLANT
DRAPE ORTHO SPLIT 77X108 STRL (DRAPES) ×4
DRAPE SURG ORHT 6 SPLT 77X108 (DRAPES) ×2 IMPLANT
DRAPE U-SHAPE 47X51 STRL (DRAPES) ×3 IMPLANT
DURAPREP 26ML APPLICATOR (WOUND CARE) ×3 IMPLANT
ELECT CAUTERY BLADE 6.4 (BLADE) ×3 IMPLANT
ELECT REM PT RETURN 9FT ADLT (ELECTROSURGICAL) ×3
ELECTRODE REM PT RTRN 9FT ADLT (ELECTROSURGICAL) ×1 IMPLANT
FACESHIELD WRAPAROUND (MASK) ×6 IMPLANT
FACESHIELD WRAPAROUND OR TEAM (MASK) ×2 IMPLANT
GAUZE SPONGE 4X4 12PLY STRL (GAUZE/BANDAGES/DRESSINGS) ×2 IMPLANT
GLOVE BIOGEL PI IND STRL 8 (GLOVE) ×2 IMPLANT
GLOVE BIOGEL PI INDICATOR 8 (GLOVE) ×2
GLOVE ORTHO TXT STRL SZ7.5 (GLOVE) ×4 IMPLANT
GOWN STRL REUS W/ TWL LRG LVL3 (GOWN DISPOSABLE) ×1 IMPLANT
GOWN STRL REUS W/ TWL XL LVL3 (GOWN DISPOSABLE) ×1 IMPLANT
GOWN STRL REUS W/TWL LRG LVL3 (GOWN DISPOSABLE) ×2
GOWN STRL REUS W/TWL XL LVL3 (GOWN DISPOSABLE) ×2
HEAD BIPOLAR AML DEPUY 42 (Hips) IMPLANT
HIP BALL ARTICU 28 +5 (Hips) ×3 IMPLANT
IMMOBILIZER KNEE 22 UNIV (SOFTGOODS) ×2 IMPLANT
KIT BASIN OR (CUSTOM PROCEDURE TRAY) ×3 IMPLANT
KIT TURNOVER KIT B (KITS) ×3 IMPLANT
NDL HYPO 25GX1X1/2 BEV (NEEDLE) ×1 IMPLANT
NEEDLE HYPO 25GX1X1/2 BEV (NEEDLE) ×3 IMPLANT
NS IRRIG 1000ML POUR BTL (IV SOLUTION) ×3 IMPLANT
PACK TOTAL JOINT (CUSTOM PROCEDURE TRAY) ×3 IMPLANT
PACK UNIVERSAL I (CUSTOM PROCEDURE TRAY) ×3 IMPLANT
PAD ABD 8X10 STRL (GAUZE/BANDAGES/DRESSINGS) ×2 IMPLANT
PAD ARMBOARD 7.5X6 YLW CONV (MISCELLANEOUS) ×6 IMPLANT
STAPLER VISISTAT 35W (STAPLE) ×3 IMPLANT
STEM SUMMIT CEMENTED BASIC SZ3 (Hips) IMPLANT
SUCTION FRAZIER HANDLE 10FR (MISCELLANEOUS) ×2
SUCTION TUBE FRAZIER 10FR DISP (MISCELLANEOUS) ×1 IMPLANT
SUMMIT CEMENT BASIC SZ3 (Hips) ×3 IMPLANT
SUT ETHIBOND NAB CT1 #1 30IN (SUTURE) ×9 IMPLANT
SUT VIC AB 1 CTX 36 (SUTURE) ×2
SUT VIC AB 1 CTX36XBRD ANBCTR (SUTURE) IMPLANT
SUT VICRYL 0 TIES 12 18 (SUTURE) ×3 IMPLANT
SYR CONTROL 10ML LL (SYRINGE) ×5 IMPLANT
TAPE CLOTH SURG 4X10 WHT LF (GAUZE/BANDAGES/DRESSINGS) ×2 IMPLANT
TOWEL OR 17X26 10 PK STRL BLUE (TOWEL DISPOSABLE) ×3 IMPLANT
TOWER CARTRIDGE SMART MIX (DISPOSABLE) ×2 IMPLANT
WATER STERILE IRR 1000ML POUR (IV SOLUTION) ×6 IMPLANT

## 2018-11-23 NOTE — Consult Note (Addendum)
Reason for Consult left femoral neck fracture Referring Physician: Arrien MD  Seward Erica Vasquez is an 85 y.o. female.  HPI: 85 year old female minimal household ambulator was with her son who he walks across room to retrieve something she tried to stand up fell suffered a left femoral neck fracture.  Previous right femoral neck fracture April 2019 treated with prosthetic replacement bipolar which has done well.  Patient's problems with blindness cataracts Alzheimer disease dementia hypertension.  She likes to listen to music and is a minimal household ambulator uses a walker.  Son is at bedside.  Son's cousin also sits with patient and I talk with her by phone as well with outlined plan mentioned below.  Patient denies neck pain no abdominal pain no chills or fever.  Past Medical History:  Diagnosis Date  . Alzheimer disease    mild  . Arthritis   . Blind   . Cataract   . Cataract cortical, senile   . Dementia   . Essential hypertension   . Glaucoma   . Hypertension     Past Surgical History:  Procedure Laterality Date  . ABDOMINAL HYSTERECTOMY    . CATARACT EXTRACTION    . GLAUCOMA SURGERY Bilateral 2010   SLT  . TOTAL HIP ARTHROPLASTY Right 01/26/2018   Procedure: TOTAL HIP ARTHROPLASTY ANTERIOR APPROACH;  Surgeon: Tarry Kos, MD;  Location: [facility] OR;  Service: Orthopedics;  Laterality: Right;    Family History  Problem Relation Age of Onset  . Dementia Mother   . Stroke Mother   . Dementia Father   . Glaucoma Father   . Cataracts Father   . Hypertension Father   . Cataracts Sister   . Glaucoma Sister   . Hypertension Sister   . Retinal detachment Sister     Social History:  reports that she has never smoked. She has never used smokeless tobacco. She reports previous alcohol use. She reports previous drug use.  Allergies:  Allergies  Allergen Reactions  . Bimatoprost Itching  . Brimonidine Itching  . Timolol Itching    Medications: I have reviewed the  patient's current medications.  Results for orders placed or performed during the hospital encounter of 11/22/18 (from the past 48 hour(s))  Urinalysis, Routine w reflex microscopic     Status: Abnormal   Collection Time: 11/22/18 11:45 PM  Result Value Ref Range   Color, Urine YELLOW YELLOW   APPearance CLEAR CLEAR   Specific Gravity, Urine 1.015 1.005 - 1.030   pH 7.0 5.0 - 8.0   Glucose, UA NEGATIVE NEGATIVE mg/dL   Hgb urine dipstick SMALL (A) NEGATIVE   Bilirubin Urine NEGATIVE NEGATIVE   Ketones, ur 5 (A) NEGATIVE mg/dL   Protein, ur NEGATIVE NEGATIVE mg/dL   Nitrite NEGATIVE NEGATIVE   Leukocytes, UA NEGATIVE NEGATIVE   RBC / HPF 11-20 0 - 5 RBC/hpf   WBC, UA 0-5 0 - 5 WBC/hpf   Bacteria, UA NONE SEEN NONE SEEN   Squamous Epithelial / LPF 0-5 0 - 5   Mucus PRESENT     Comment: Performed at [facility], [address], [place], [place] [address]  CBC with Differential/Platelet     Status: Abnormal   Collection Time: 11/22/18 11:59 PM  Result Value Ref Range   WBC 6.4 4.0 - 10.5 K/uL   RBC 4.41 3.87 - 5.11 MIL/uL   Hemoglobin 11.8 (L) 12.0 - 15.0 g/dL   HCT 76.2 26.3 - 33.5 %   MCV 86.8  80.0 - 100.0 fL   MCH 26.8 26.0 - 34.0 pg   MCHC 30.8 30.0 - 36.0 g/dL   RDW 78.214.5 95.611.5 - 21.315.5 %   Platelets 190 150 - 400 K/uL   nRBC 0.0 0.0 - 0.2 %   Neutrophils Relative % 74 %   Neutro Abs 4.9 1.7 - 7.7 K/uL   Lymphocytes Relative 16 %   Lymphs Abs 1.0 0.7 - 4.0 K/uL   Monocytes Relative 7 %   Monocytes Absolute 0.5 0.1 - 1.0 K/uL   Eosinophils Relative 1 %   Eosinophils Absolute 0.0 0.0 - 0.5 K/uL   Basophils Relative 1 %   Basophils Absolute 0.0 0.0 - 0.1 K/uL   Immature Granulocytes 1 %   Abs Immature Granulocytes 0.03 0.00 - 0.07 K/uL    Comment: Performed at [facility], [address] [place], [place] [address]  Comprehensive metabolic panel     Status: Abnormal   Collection Time: 11/22/18 11:59 PM  Result Value Ref Range   Sodium 140 135 - 145 mmol/L     Potassium 4.1 3.5 - 5.1 mmol/L    Comment: SPECIMEN HEMOLYZED. HEMOLYSIS MAY AFFECT INTEGRITY OF RESULTS.   Chloride 103 98 - 111 mmol/L   CO2 20 (L) 22 - 32 mmol/L   Glucose, Bld 112 (H) 70 - 99 mg/dL   BUN 12 8 - 23 mg/dL   Creatinine, Ser 8.460.81 0.44 - 1.00 mg/dL   Calcium 8.8 (L) 8.9 - 10.3 mg/dL   Total Protein 5.6 (L) 6.5 - 8.1 g/dL   Albumin 3.4 (L) 3.5 - 5.0 g/dL   AST 29 15 - 41 U/L   ALT 10 0 - 44 U/L   Alkaline Phosphatase 42 38 - 126 U/L   Total Bilirubin 1.2 0.3 - 1.2 mg/dL   GFR calc non Af Amer >60 >60 mL/min   GFR calc Af Amer >60 >60 mL/min   Anion gap 17 (H) 5 - 15    Comment: Performed at [facility], [address] [place], [place] [address]  Troponin I - ONCE - STAT     Status: None   Collection Time: 11/22/18 11:59 PM  Result Value Ref Range   Troponin I <0.03 <0.03 ng/mL    Comment: Performed at [facility], [address] [place], [place] [address]  Protime-INR     Status: None   Collection Time: 11/23/18  2:47 AM  Result Value Ref Range   Prothrombin Time 13.2 11.4 - 15.2 seconds   INR 1.01     Comment: Performed at [facility], [address] [place], [place] [address]  Type and screen [facility]     Status: None   Collection Time: 11/23/18  4:11 AM  Result Value Ref Range   ABO/RH(D) O POS    Antibody Screen NEG    Sample Expiration      11/26/2018 Performed at [facility], [address] [place], [place] [address]   CBC     Status: Abnormal   Collection Time: 11/23/18  4:11 AM  Result Value Ref Range   WBC 5.6 4.0 - 10.5 K/uL   RBC 4.18 3.87 - 5.11 MIL/uL   Hemoglobin 11.3 (L) 12.0 - 15.0 g/dL   HCT 36.636.0 44.036.0 - 34.746.0 %   MCV 86.1 80.0 - 100.0 fL   MCH 27.0 26.0 - 34.0 pg   MCHC 31.4 30.0 - 36.0 g/dL  RDW 14.5 11.5 - 15.5 %   Platelets 155 150 - 400 K/uL   nRBC 0.0 0.0 - 0.2 %    Comment: Performed at [facility], [address] [place], [place] [address]  Basic metabolic panel      Status: Abnormal   Collection Time: 11/23/18  4:11 AM  Result Value Ref Range   Sodium 141 135 - 145 mmol/L   Potassium 3.3 (L) 3.5 - 5.1 mmol/L   Chloride 107 98 - 111 mmol/L   CO2 24 22 - 32 mmol/L   Glucose, Bld 129 (H) 70 - 99 mg/dL   BUN 8 8 - 23 mg/dL   Creatinine, Ser 6.040.65 0.44 - 1.00 mg/dL   Calcium 8.8 (L) 8.9 - 10.3 mg/dL   GFR calc non Af Amer >60 >60 mL/min   GFR calc Af Amer >60 >60 mL/min   Anion gap 10 5 - 15    Comment: Performed at [facility], [address] [place], [place] [address]    Dg Chest 1 View  Result Date: 11/23/2018 CLINICAL DATA:  Fall.  Hip deformity. EXAM: CHEST  1 VIEW COMPARISON:  None. FINDINGS: Cardiac silhouette is mildly enlarged, accentuated by rotation LEFT. Tortuous calcified aorta. No pleural effusion. Patchy RIGHT perihilar airspace opacity. Elevated RIGHT hemidiaphragm. Strandy densities LEFT lung base. No pneumothorax. Osteopenia. IMPRESSION: 1. Patchy RIGHT perihilar airspace opacity versus projectional artifact. LEFT lung base atelectasis/scar. Recommend follow-up PA and lateral views of the chest when clinically able. 2. Mild cardiomegaly. 3. Aortic Atherosclerosis (ICD10-I70.0). Electronically Signed   By: Awilda Metroourtnay  Bloomer M.D.   On: 11/23/2018 01:22   Dg Thoracic Spine 2 View  Result Date: 11/23/2018 CLINICAL DATA:  Pain after a fall. EXAM: THORACIC SPINE 2 VIEWS COMPARISON:  None. FINDINGS: Thoracic scoliosis convex towards the right. Diffuse bone demineralization. No anterior subluxations. Diffuse degenerative changes with narrowed interspaces and endplate hypertrophic change. No paraspinal soft tissue swelling. No focal bone lesion or bone destruction. IMPRESSION: Degenerative changes and scoliosis of the thoracic spine. No acute displaced fractures identified. Electronically Signed   By: Burman NievesWilliam  Stevens M.D.   On: 11/23/2018 01:27   Dg Lumbar Spine 2-3 Views  Result Date: 11/23/2018 CLINICAL DATA:  Pain after a fall. EXAM:  LUMBAR SPINE - 2-3 VIEW COMPARISON:  None. FINDINGS: Diffuse bone demineralization. Lumbar scoliosis convex towards the right. Diffuse degenerative changes with narrowed interspaces and endplate hypertrophic changes throughout. Anterior compression of the L1 vertebra with about 50% loss of height anteriorly. Suggestion of mild retropulsion of fracture fragments. No prior comparison studies are available in chronicity is indeterminate. Visualized sacrum appears intact. IMPRESSION: Age-indeterminate anterior compression of the L1 vertebra. Degenerative changes and scoliosis of the lumbar spine. Electronically Signed   By: Burman NievesWilliam  Stevens M.D.   On: 11/23/2018 01:26   Ct Head Wo Contrast  Result Date: 11/23/2018 CLINICAL DATA:  Patient fell out of wheelchair landing on left hip. Dementia. EXAM: CT HEAD WITHOUT CONTRAST CT CERVICAL SPINE WITHOUT CONTRAST TECHNIQUE: Multidetector CT imaging of the head and cervical spine was performed following the standard protocol without intravenous contrast. Multiplanar CT image reconstructions of the cervical spine were also generated. COMPARISON:  01/25/2018 CT head FINDINGS: CT HEAD FINDINGS Brain: Moderate degree of sulcal and ventricular prominence, stable in appearance consistent with atrophy. Mild-to-moderate small vessel ischemic disease of periventricular and subcortical white matter. No acute intracranial hemorrhage. No intra-axial mass nor extra-axial fluid. Midline fourth ventricle basal cisterns without effacement. Vascular: Moderate atherosclerosis  of the carotid siphons. No hyperdense vessel sign. Skull: Intact Sinuses/Orbits: Right cataract extraction. Intact orbits and globes. No acute sinus disease. Other: Clear mastoids. CT CERVICAL SPINE FINDINGS Alignment: Reversal of cervical lordosis attributable to multilevel degenerative disc disease. Intact craniocervical relationship. Intact atlantodental interval. Skull base and vertebrae: Intact skull base. No acute  cervical spine fracture. No jumped or perched facets. Soft tissues and spinal canal: No prevertebral fluid or swelling. No visible canal hematoma. Disc levels: Mild disc flattening C2-3 with moderate-to-marked disc flattening C3 through T1. Small posterior marginal osteophytes are identified with calcified central disc-osteophyte complex at C4-5. Bilateral uncovertebral joint osteoarthritis is seen C3 through C7. Mild bilateral C5-6 foraminal encroachment from uncinate spurring Upper chest: Ectatic aortic arch, partially imaged. The visualized lungs are nonacute. Other: None IMPRESSION: 1. Atrophy with chronic small vessel ischemic disease. No acute intracranial abnormality. 2. Cervical spondylosis without acute cervical spine fracture. Electronically Signed   By: Tollie Eth M.D.   On: 11/23/2018 01:00   Ct Cervical Spine Wo Contrast  Result Date: 11/23/2018 CLINICAL DATA:  Patient fell out of wheelchair landing on left hip. Dementia. EXAM: CT HEAD WITHOUT CONTRAST CT CERVICAL SPINE WITHOUT CONTRAST TECHNIQUE: Multidetector CT imaging of the head and cervical spine was performed following the standard protocol without intravenous contrast. Multiplanar CT image reconstructions of the cervical spine were also generated. COMPARISON:  01/25/2018 CT head FINDINGS: CT HEAD FINDINGS Brain: Moderate degree of sulcal and ventricular prominence, stable in appearance consistent with atrophy. Mild-to-moderate small vessel ischemic disease of periventricular and subcortical white matter. No acute intracranial hemorrhage. No intra-axial mass nor extra-axial fluid. Midline fourth ventricle basal cisterns without effacement. Vascular: Moderate atherosclerosis of the carotid siphons. No hyperdense vessel sign. Skull: Intact Sinuses/Orbits: Right cataract extraction. Intact orbits and globes. No acute sinus disease. Other: Clear mastoids. CT CERVICAL SPINE FINDINGS Alignment: Reversal of cervical lordosis attributable to  multilevel degenerative disc disease. Intact craniocervical relationship. Intact atlantodental interval. Skull base and vertebrae: Intact skull base. No acute cervical spine fracture. No jumped or perched facets. Soft tissues and spinal canal: No prevertebral fluid or swelling. No visible canal hematoma. Disc levels: Mild disc flattening C2-3 with moderate-to-marked disc flattening C3 through T1. Small posterior marginal osteophytes are identified with calcified central disc-osteophyte complex at C4-5. Bilateral uncovertebral joint osteoarthritis is seen C3 through C7. Mild bilateral C5-6 foraminal encroachment from uncinate spurring Upper chest: Ectatic aortic arch, partially imaged. The visualized lungs are nonacute. Other: None IMPRESSION: 1. Atrophy with chronic small vessel ischemic disease. No acute intracranial abnormality. 2. Cervical spondylosis without acute cervical spine fracture. Electronically Signed   By: Tollie Eth M.D.   On: 11/23/2018 01:00   Dg Hips Bilat W Or Wo Pelvis 3-4 Views  Result Date: 11/23/2018 CLINICAL DATA:  Fall. Hip deformity. EXAM: DG HIP (WITH OR WITHOUT PELVIS) 3-4V BILAT COMPARISON:  01/25/2018 FINDINGS: Diffuse bone demineralization. Pelvis appears intact. SI joints and symphysis pubis are not displaced. Right hip arthroplasty. Components appear well seated. Acute transverse subcapital fracture of the left proximal femur with varus angulation of the fracture fragments. No definite extension into the inter trochanteric region although the greater trochanteric margin is somewhat indistinct superiorly. Degenerative changes in the left hip and lower lumbar spine. Calcified phleboliths. Large amount of stool in the rectum. IMPRESSION: 1. Acute subcapital fracture of the left proximal femur with varus angulation. 2. Previous right hip arthroplasty.  Hardware intact. Electronically Signed   By: Burman Nieves M.D.   On: 11/23/2018 01:24  ROS 14 point systems updated negative  other than as mentioned above in HPI. Blood pressure (!) 165/87, pulse (!) 105, temperature 99.4 F (37.4 C), temperature source Axillary, resp. rate 15, SpO2 98 %. Physical Exam  Constitutional:  Thin, elderly eyes closed  Eyes:  Patient blind keeps her eyes closed.  Neck: No tracheal deviation present. No thyromegaly present.  Cardiovascular: Normal rate.  Respiratory: Effort normal.  Right mobile chest mass firm golf ball size.   GI: Soft. Bowel sounds are normal. She exhibits no distension. There is no abdominal tenderness.  Musculoskeletal:        General: No edema.  Neurological: She is alert.  Answers questions with short answers with her son at bedside.  Skin: Skin is warm and dry.    Assessment/Plan: Displaced left femoral neck fracture 1 year post right femoral neck fracture treated with prosthetic replacement.  Plan bipolar hemiarthroplasty.  Procedure discussed with son and other family members by phone.  Outline plan discussed with patient informed consent obtained they agree to proceed.  Eldred MangesMark C Skylyn Slezak 11/23/2018, 11:14 AM

## 2018-11-23 NOTE — Progress Notes (Signed)
Pt's Son in room on unit, states he is aware of the large mass to pt's R breast area and her "regular" doctors are aware as well.  It's never been tested, treated or diagnosed to date.  [id number]

## 2018-11-23 NOTE — H&P (Signed)
History and Physical    Erica Vasquez:[id number] DOB: [birth year] DOA: 11/22/2018  PCP: Mirna Mires, MD  Patient coming from: Home  Chief Complaint: Fall from wheelchair  HPI: Erica Vasquez is a 85 y.o. female with medical history significant of dementia, hypertension brought in with son after she tried to get out of the wheelchair and fell injuring her left hip.  All history is obtained from emergency room staff and from record as patient is demented and cannot provide any history and family is left.  Patient found to have a left femur fracture requiring surgical stabilization.  Review of Systems: Unobtainable secondary to patient's dementia  Past Medical History:  Diagnosis Date  . Alzheimer disease    mild  . Arthritis   . Blind   . Cataract   . Cataract cortical, senile   . Dementia   . Essential hypertension   . Glaucoma   . Hypertension     Past Surgical History:  Procedure Laterality Date  . ABDOMINAL HYSTERECTOMY    . CATARACT EXTRACTION    . GLAUCOMA SURGERY Bilateral 2010   SLT  . TOTAL HIP ARTHROPLASTY Right 01/26/2018   Procedure: TOTAL HIP ARTHROPLASTY ANTERIOR APPROACH;  Surgeon: Tarry Kos, MD;  Location: [facility] OR;  Service: Orthopedics;  Laterality: Right;     reports that she has never smoked. She has never used smokeless tobacco. She reports previous alcohol use. She reports previous drug use.  Allergies  Allergen Reactions  . Bimatoprost Itching  . Brimonidine Itching  . Timolol Itching    Family History  Problem Relation Age of Onset  . Dementia Mother   . Stroke Mother   . Dementia Father   . Glaucoma Father   . Cataracts Father   . Hypertension Father   . Cataracts Sister   . Glaucoma Sister   . Hypertension Sister   . Retinal detachment Sister     Prior to Admission medications   Medication Sig Start Date End Date Taking? Authorizing Provider  amLODipine (NORVASC) 2.5 MG tablet Take 1 tablet (2.5 mg total) by mouth daily.  01/30/18 03/01/18  Arrien, York Ram, MD  NUTRITIONAL SUPPLEMENT LIQD Take by mouth. Medpass 90 ml with med administration TID D/T weight loss    [provider]  oxyCODONE-acetaminophen (PERCOCET) 5-325 MG tablet Take 1-2 tablets by mouth every 4 (four) hours as needed for severe pain. 01/26/18   Tarry Kos, MD  potassium chloride SA (K-DUR,KLOR-CON) 20 MEQ tablet Take 20 mEq by mouth daily.    [provider]    Physical Exam: Vitals:   11/23/18 0145 11/23/18 0200 11/23/18 0215 11/23/18 0230  BP: (!) 145/64 (!) 145/67 (!) 149/70 (!) 153/68  Pulse: 95 94 94 98  Resp: (!) 29 14 17 17   Temp:      TempSrc:      SpO2: 100% 97% 99% 98%      Constitutional: NAD, calm, comfortable elderly demented Vitals:   11/23/18 0145 11/23/18 0200 11/23/18 0215 11/23/18 0230  BP: (!) 145/64 (!) 145/67 (!) 149/70 (!) 153/68  Pulse: 95 94 94 98  Resp: (!) 29 14 17 17   Temp:      TempSrc:      SpO2: 100% 97% 99% 98%   Eyes: PERRL, lids and conjunctivae normal ENMT: Mucous membranes are moist. Posterior pharynx clear of any exudate or lesions.Normal dentition.  Neck: normal, supple, no masses, no thyromegaly Respiratory: clear to auscultation bilaterally, no wheezing, no crackles. Normal  respiratory effort. No accessory muscle use.  Cardiovascular: Regular rate and rhythm, no murmurs / rubs / gallops. No extremity edema. 2+ pedal pulses. No carotid bruits.  Abdomen: no tenderness, no masses palpated. No hepatosplenomegaly. Bowel sounds positive.  Musculoskeletal: no clubbing / cyanosis. No joint deformity upper and lower extremities. Good ROM, no contractures. Normal muscle tone.  Except left femur is angulated Skin: no rashes, lesions, ulcers. No induration Neurologic: CN 2-12 grossly intact. Sensation intact, DTR normal. Strength 5/5 in all 4.  Psychiatric: Not agitated   Labs on Admission: I have personally reviewed following labs and imaging studies  CBC: Recent Labs    Lab 11/22/18 2359  WBC 6.4  NEUTROABS 4.9  HGB 11.8*  HCT 38.3  MCV 86.8  PLT 190   Basic Metabolic Panel: Recent Labs  Lab 11/22/18 2359  NA 140  K 4.1  CL 103  CO2 20*  GLUCOSE 112*  BUN 12  CREATININE 0.81  CALCIUM 8.8*   GFR: CrCl cannot be calculated (Unknown ideal weight.). Liver Function Tests: Recent Labs  Lab 11/22/18 2359  AST 29  ALT 10  ALKPHOS 42  BILITOT 1.2  PROT 5.6*  ALBUMIN 3.4*   No results for input(s): LIPASE, AMYLASE in the last 168 hours. No results for input(s): AMMONIA in the last 168 hours. Coagulation Profile: Recent Labs  Lab 11/23/18 0247  INR 1.01   Cardiac Enzymes: Recent Labs  Lab 11/22/18 2359  TROPONINI <0.03   BNP (last 3 results) No results for input(s): PROBNP in the last 8760 hours. HbA1C: No results for input(s): HGBA1C in the last 72 hours. CBG: No results for input(s): GLUCAP in the last 168 hours. Lipid Profile: No results for input(s): CHOL, HDL, LDLCALC, TRIG, CHOLHDL, LDLDIRECT in the last 72 hours. Thyroid Function Tests: No results for input(s): TSH, T4TOTAL, FREET4, T3FREE, THYROIDAB in the last 72 hours. Anemia Panel: No results for input(s): VITAMINB12, FOLATE, FERRITIN, TIBC, IRON, RETICCTPCT in the last 72 hours. Urine analysis:    Component Value Date/Time   COLORURINE YELLOW 11/22/2018 2345   APPEARANCEUR CLEAR 11/22/2018 2345   LABSPEC 1.015 11/22/2018 2345   PHURINE 7.0 11/22/2018 2345   GLUCOSEU NEGATIVE 11/22/2018 2345   HGBUR SMALL (A) 11/22/2018 2345   BILIRUBINUR NEGATIVE 11/22/2018 2345   KETONESUR 5 (A) 11/22/2018 2345   PROTEINUR NEGATIVE 11/22/2018 2345   NITRITE NEGATIVE 11/22/2018 2345   LEUKOCYTESUR NEGATIVE 11/22/2018 2345   Sepsis Labs: !!!!!!!!!!!!!!!!!!!!!!!!!!!!!!!!!!!!!!!!!!!! @LABRCNTIP (procalcitonin:4,lacticidven:4) )No results found for this or any previous visit (from the past 240 hour(s)).   Radiological Exams on Admission: Dg Chest 1 View  Result Date:  11/23/2018 CLINICAL DATA:  Fall.  Hip deformity. EXAM: CHEST  1 VIEW COMPARISON:  None. FINDINGS: Cardiac silhouette is mildly enlarged, accentuated by rotation LEFT. Tortuous calcified aorta. No pleural effusion. Patchy RIGHT perihilar airspace opacity. Elevated RIGHT hemidiaphragm. Strandy densities LEFT lung base. No pneumothorax. Osteopenia. IMPRESSION: 1. Patchy RIGHT perihilar airspace opacity versus projectional artifact. LEFT lung base atelectasis/scar. Recommend follow-up PA and lateral views of the chest when clinically able. 2. Mild cardiomegaly. 3. Aortic Atherosclerosis (ICD10-I70.0). Electronically Signed   By: Awilda Metroourtnay  Bloomer M.D.   On: 11/23/2018 01:22   Dg Thoracic Spine 2 View  Result Date: 11/23/2018 CLINICAL DATA:  Pain after a fall. EXAM: THORACIC SPINE 2 VIEWS COMPARISON:  None. FINDINGS: Thoracic scoliosis convex towards the right. Diffuse bone demineralization. No anterior subluxations. Diffuse degenerative changes with narrowed interspaces and endplate hypertrophic change. No paraspinal soft tissue swelling. No  focal bone lesion or bone destruction. IMPRESSION: Degenerative changes and scoliosis of the thoracic spine. No acute displaced fractures identified. Electronically Signed   By: Burman NievesWilliam  Stevens M.D.   On: 11/23/2018 01:27   Dg Lumbar Spine 2-3 Views  Result Date: 11/23/2018 CLINICAL DATA:  Pain after a fall. EXAM: LUMBAR SPINE - 2-3 VIEW COMPARISON:  None. FINDINGS: Diffuse bone demineralization. Lumbar scoliosis convex towards the right. Diffuse degenerative changes with narrowed interspaces and endplate hypertrophic changes throughout. Anterior compression of the L1 vertebra with about 50% loss of height anteriorly. Suggestion of mild retropulsion of fracture fragments. No prior comparison studies are available in chronicity is indeterminate. Visualized sacrum appears intact. IMPRESSION: Age-indeterminate anterior compression of the L1 vertebra. Degenerative changes and  scoliosis of the lumbar spine. Electronically Signed   By: Burman NievesWilliam  Stevens M.D.   On: 11/23/2018 01:26   Ct Head Wo Contrast  Result Date: 11/23/2018 CLINICAL DATA:  Patient fell out of wheelchair landing on left hip. Dementia. EXAM: CT HEAD WITHOUT CONTRAST CT CERVICAL SPINE WITHOUT CONTRAST TECHNIQUE: Multidetector CT imaging of the head and cervical spine was performed following the standard protocol without intravenous contrast. Multiplanar CT image reconstructions of the cervical spine were also generated. COMPARISON:  01/25/2018 CT head FINDINGS: CT HEAD FINDINGS Brain: Moderate degree of sulcal and ventricular prominence, stable in appearance consistent with atrophy. Mild-to-moderate small vessel ischemic disease of periventricular and subcortical white matter. No acute intracranial hemorrhage. No intra-axial mass nor extra-axial fluid. Midline fourth ventricle basal cisterns without effacement. Vascular: Moderate atherosclerosis of the carotid siphons. No hyperdense vessel sign. Skull: Intact Sinuses/Orbits: Right cataract extraction. Intact orbits and globes. No acute sinus disease. Other: Clear mastoids. CT CERVICAL SPINE FINDINGS Alignment: Reversal of cervical lordosis attributable to multilevel degenerative disc disease. Intact craniocervical relationship. Intact atlantodental interval. Skull base and vertebrae: Intact skull base. No acute cervical spine fracture. No jumped or perched facets. Soft tissues and spinal canal: No prevertebral fluid or swelling. No visible canal hematoma. Disc levels: Mild disc flattening C2-3 with moderate-to-marked disc flattening C3 through T1. Small posterior marginal osteophytes are identified with calcified central disc-osteophyte complex at C4-5. Bilateral uncovertebral joint osteoarthritis is seen C3 through C7. Mild bilateral C5-6 foraminal encroachment from uncinate spurring Upper chest: Ectatic aortic arch, partially imaged. The visualized lungs are nonacute.  Other: None IMPRESSION: 1. Atrophy with chronic small vessel ischemic disease. No acute intracranial abnormality. 2. Cervical spondylosis without acute cervical spine fracture. Electronically Signed   By: Tollie Ethavid  Kwon M.D.   On: 11/23/2018 01:00   Ct Cervical Spine Wo Contrast  Result Date: 11/23/2018 CLINICAL DATA:  Patient fell out of wheelchair landing on left hip. Dementia. EXAM: CT HEAD WITHOUT CONTRAST CT CERVICAL SPINE WITHOUT CONTRAST TECHNIQUE: Multidetector CT imaging of the head and cervical spine was performed following the standard protocol without intravenous contrast. Multiplanar CT image reconstructions of the cervical spine were also generated. COMPARISON:  01/25/2018 CT head FINDINGS: CT HEAD FINDINGS Brain: Moderate degree of sulcal and ventricular prominence, stable in appearance consistent with atrophy. Mild-to-moderate small vessel ischemic disease of periventricular and subcortical white matter. No acute intracranial hemorrhage. No intra-axial mass nor extra-axial fluid. Midline fourth ventricle basal cisterns without effacement. Vascular: Moderate atherosclerosis of the carotid siphons. No hyperdense vessel sign. Skull: Intact Sinuses/Orbits: Right cataract extraction. Intact orbits and globes. No acute sinus disease. Other: Clear mastoids. CT CERVICAL SPINE FINDINGS Alignment: Reversal of cervical lordosis attributable to multilevel degenerative disc disease. Intact craniocervical relationship. Intact atlantodental interval. Skull base  and vertebrae: Intact skull base. No acute cervical spine fracture. No jumped or perched facets. Soft tissues and spinal canal: No prevertebral fluid or swelling. No visible canal hematoma. Disc levels: Mild disc flattening C2-3 with moderate-to-marked disc flattening C3 through T1. Small posterior marginal osteophytes are identified with calcified central disc-osteophyte complex at C4-5. Bilateral uncovertebral joint osteoarthritis is seen C3 through C7.  Mild bilateral C5-6 foraminal encroachment from uncinate spurring Upper chest: Ectatic aortic arch, partially imaged. The visualized lungs are nonacute. Other: None IMPRESSION: 1. Atrophy with chronic small vessel ischemic disease. No acute intracranial abnormality. 2. Cervical spondylosis without acute cervical spine fracture. Electronically Signed   By: Tollie Eth M.D.   On: 11/23/2018 01:00   Dg Hips Bilat W Or Wo Pelvis 3-4 Views  Result Date: 11/23/2018 CLINICAL DATA:  Fall. Hip deformity. EXAM: DG HIP (WITH OR WITHOUT PELVIS) 3-4V BILAT COMPARISON:  01/25/2018 FINDINGS: Diffuse bone demineralization. Pelvis appears intact. SI joints and symphysis pubis are not displaced. Right hip arthroplasty. Components appear well seated. Acute transverse subcapital fracture of the left proximal femur with varus angulation of the fracture fragments. No definite extension into the inter trochanteric region although the greater trochanteric margin is somewhat indistinct superiorly. Degenerative changes in the left hip and lower lumbar spine. Calcified phleboliths. Large amount of stool in the rectum. IMPRESSION: 1. Acute subcapital fracture of the left proximal femur with varus angulation. 2. Previous right hip arthroplasty.  Hardware intact. Electronically Signed   By: Burman Nieves M.D.   On: 11/23/2018 01:24   Old chart reviewed Case discussed with Dr. Senaida Ores in the ED Chest x-ray reviewed no edema or infiltrate 12-lead EKG reviewed sinus rhythm with left anterior fascicular block no changes from prior EKG  Assessment/Plan 85 year old demented female with mechanical fall with left femur fracture Principal Problem: Femur fracture (HCC)-keep n.p.o. until seen by orthopedic surgery.  Dr. Ophelia Charter notified.  Hold anticoagulants also until surgical plan is clear.  Admit to medical bed.  Patient is mostly wheelchair-bound but can still transfer from wheelchair to bed minimally walking.  Certainly high risk for  surgery but may be her only option.  Placed on hip fracture pathway.  Provide PRN pain meds and muscle relaxants. IV fluids normal saline overnight and repeat labs in the morning.   Active Problems:   Fall-noted   Essential hypertension, benign-continue Norvasc   Alzheimer disease (HCC)-stable moderate to severe    DVT prophylaxis: SCDs Code Status: Presumptive full code Family Communication: None Disposition Plan: Days Consults called: Orthopedic surgery Admission status: Admission   Lismary Kiehn A MD [facility]  If 7PM-7AM, please contact night-coverage [email or web address] Password [facility]  11/23/2018, 3:45 AM

## 2018-11-23 NOTE — Op Note (Addendum)
Preop diagnosis: Left displaced femoral neck fracture  Postop diagnosis: Same  Procedure: Left hip bipolar hemiarthroplasty.  Surgeon: Annell Greening, MD  Anesthesia: General  EBL: 100 cc  Drains: None  Implants: Depuy  #3 Summit basic cemented stem +5 neck 42 mm bipolar snap-on ball.  #3 cement restrictor.  10 mm centralizer.  Procedure: After induction of general esthesia with general anesthesia was noted patient had a bowel movement she was cleaned off return in the lateral position Mark 7 frame was used for stabilization and a axillary roll 1015 drape applied in usual DuraPrep preoperative Ancef prophylaxis timeout procedure completed while the DuraPrep was drying.  Sheets above below split sheets impervious split sheets and stockinette Coban were applied sterile skin marker for posterior approach and Betadine Steri-Drape x2 ceiling the skin.  Patient was minimal household ambulator with a walker and also is blind with some Alzheimer's.  Posterior approach was made gluteus maximus was split in line with the fibers piriformis tagged cut posterior capsule was opened.  Neck was cut and fracture extended down slightly below 11 mm above the lesser trochanter so cut was made about 8 mm above the lesser trochanter.  Head was removed with a corkscrew trial size 42 mm identical to the opposite hip that it broke last year and had a bipolar placed by Dr.Xu.  Good suction fit with trialing.  Preparation of the canal.  With the canal finder the anterior cortex was very thin and fenestration occurred which was 1 cm in the canal about the level of the lesser troches but on the anterior cortex.  Some bone was removed from the head pack to fill the hole and then sequential preparation of the canal was performed progressing up on broaching.  Distal cementralizer was placed based on size there is a #3.  #3 Summit basic was then cemented in after mixing the cement preparation the canal with lavage and small dry sponges.   Appropriate anteversion was held in collar set down flush with the calcar.  Prior to cementing in the stem trial had been inserted there was excellent stability with a +5 neck length which restored patient's identical leg lengths good stability flexion 90 internal rotation 85 degrees without subluxation of the hip and only trace shock.  Sciatic nerve was protected throughout the case particularly with reduction and dislocation.  Trials were removed cement was hardened 15 minutes and then snap-on ball with the +5 neck length was placed after cleaning the trunnion testing it was secured and then protecting the sciatic nerve the hip was reduced again with identical findings good stability.  All excessive cement had been removed the socket was checked and was clean before the hip was reduced.  Sciatic nerve was protected.  A cross table lateral x-ray was taken which showed the stem was directly down the middle canal with good cement mantle.  Collar was 1 fingerbreadth above the lesser trochanter.  Femur was intact.  Piriformis repaired back to gluteus medius with #1 Tycron.  #1 Vicryl in the tensor fascia gluteus maximus fascia.  2-0 Vicryl subtenons tissue skin staple closure Marcaine infiltration into the skin.  Skin staples 4 x 4's ABD and tape was applied.  Knee immobilizer is applied patient tolerated the procedure well.

## 2018-11-23 NOTE — Anesthesia Procedure Notes (Signed)
Procedure Name: Intubation Date/Time: 11/23/2018 12:36 PM Performed by: Inda Coke, CRNA Pre-anesthesia Checklist: Patient identified, Emergency Drugs available, Suction available and Patient being monitored Patient Re-evaluated:Patient Re-evaluated prior to induction Oxygen Delivery Method: Circle System Utilized Preoxygenation: Pre-oxygenation with 100% oxygen Induction Type: IV induction Ventilation: Mask ventilation without difficulty Laryngoscope Size: Mac and 3 Grade View: Grade I Tube type: Oral Tube size: 7.0 mm Number of attempts: 1 Airway Equipment and Method: Stylet and Oral airway Placement Confirmation: ETT inserted through vocal cords under direct vision,  positive ETCO2 and breath sounds checked- equal and bilateral Secured at: 20 cm Tube secured with: Tape Dental Injury: Teeth and Oropharynx as per pre-operative assessment

## 2018-11-23 NOTE — Progress Notes (Signed)
PROGRESS NOTE    Erica Vasquez  PIR:[id number] DOB: July 02, 1930 DOA: 11/22/2018 PCP: Mirna Mires, MD    Brief Narrative:  85 year old female who presented after mechanical fall.  She does have significant dementia along with history of hypertension and ambulatory dysfunction, wheelchair-bound.  Apparently while trying to get out of her wheelchair she sustained a mechanical fall, injuring her left hip. History was limited due to patient's dementia.  On her initial physical examination blood pressure 145/64, heart rate 95, respiratory rate 14, oxygen saturation 97%, she had moist mucous membranes, her lungs were clear to auscultation bilaterally, heart S1-S2 present rhythmic abdomen was soft nontender, no lower extremity edema.  Her left femur was angulated.  Sodium 141, potassium 3.3, chloride 107, bicarb 24, glucose 129, BUN 8, creatinine 0.65, white count 5.6, hemoglobin 9.3, hematocrit 36.0, platelets 155.  Urine analysis negative for infection.  Head and cervical CT no acute changes.  Chest x-ray with bibasilar atelectasis.  Hip films with acute subcapital fracture of the left proximal femur with varus angulation.  Previous right hip arthroplasty, hardware intact.  EKG with normal sinus rhythm, left axis deviation, left anterior fascicular block, poor R wave progression.  Patient was admitted to the hospital working diagnosis of left proximal femur fracture.    Assessment & Plan:   Principal Problem:   Hip fracture [facility]) Active Problems:   Fall   Essential hypertension, benign   Alzheimer disease (HCC)   1. Left proximal femur fracture. Will continue pain control with as needed morphine, hydrocodone, and acetaminophen. Continue dvt prophylaxis and follow with orthopedic recommendations. Will add GI prophylaxis with pantoprazole.   2. HTN. Continue low dose amlodipine for blood pressure control. Continue gentle hydration with dextrose and saline IV.   3. Dementia. Patient is poorly  responsive today, she is wheelchair bound, will continue pain control, hydration and neuro checks per unit protocol, patient is high risk for delirium.   4. Hypokalemia. Will continue K correction with Kcl, follow on renal panel in am. Renal function is preserved.    DVT prophylaxis: enoxaparin   Code Status: full Family Communication: I spoke with patient's son at the bedside and all questions were addressed.  Disposition Plan/ discharge barriers: Pending orthopedic evaluation and possible surgical intervention.   There is no height or weight on file to calculate BMI. Malnutrition Type:      Malnutrition Characteristics:      Nutrition Interventions:     RN Pressure Injury Documentation:     Consultants:   Orthopedics   Procedures:     Antimicrobials:       Subjective: Patient somnolent, not in pain or dyspnea, her son is at the bedside. Apparently her dementia is severe, needs assistance with her activities of daily living.   Objective: Vitals:   11/23/18 0430 11/23/18 0445 11/23/18 0500 11/23/18 0650  BP: 131/71 (!) 141/69 127/71 (!) 165/87  Pulse: (!) 103 100 97 (!) 105  Resp: 15 17 15 15   Temp:    99.4 F (37.4 C)  TempSrc:    Axillary  SpO2: 98% 98% 97% 98%   No intake or output data in the 24 hours ending 11/23/18 0956 There were no vitals filed for this visit.  Examination:   General: somnolent, deconditioned.  Neurology: somnolent.  E ENT: mild pallor, no icterus, oral mucosa moist Cardiovascular: No JVD. S1-S2 present, rhythmic, no gallops, rubs, or murmurs. No lower extremity edema. Pulmonary: vesicular breath sounds bilaterally, adequate air movement, no wheezing, rhonchi or  rales. Gastrointestinal. Abdomen with no organomegaly, non tender, no rebound or guarding Skin. No rashes Musculoskeletal: left leg shortened and externally rotated.      Data Reviewed: I have personally reviewed following labs and imaging  studies  CBC: Recent Labs  Lab 11/22/18 2359 11/23/18 0411  WBC 6.4 5.6  NEUTROABS 4.9  --   HGB 11.8* 11.3*  HCT 38.3 36.0  MCV 86.8 86.1  PLT 190 155   Basic Metabolic Panel: Recent Labs  Lab 11/22/18 2359 11/23/18 0411  NA 140 141  K 4.1 3.3*  CL 103 107  CO2 20* 24  GLUCOSE 112* 129*  BUN 12 8  CREATININE 0.81 0.65  CALCIUM 8.8* 8.8*   GFR: CrCl cannot be calculated (Unknown ideal weight.). Liver Function Tests: Recent Labs  Lab 11/22/18 2359  AST 29  ALT 10  ALKPHOS 42  BILITOT 1.2  PROT 5.6*  ALBUMIN 3.4*   No results for input(s): LIPASE, AMYLASE in the last 168 hours. No results for input(s): AMMONIA in the last 168 hours. Coagulation Profile: Recent Labs  Lab 11/23/18 0247  INR 1.01   Cardiac Enzymes: Recent Labs  Lab 11/22/18 2359  TROPONINI <0.03   BNP (last 3 results) No results for input(s): PROBNP in the last 8760 hours. HbA1C: No results for input(s): HGBA1C in the last 72 hours. CBG: No results for input(s): GLUCAP in the last 168 hours. Lipid Profile: No results for input(s): CHOL, HDL, LDLCALC, TRIG, CHOLHDL, LDLDIRECT in the last 72 hours. Thyroid Function Tests: No results for input(s): TSH, T4TOTAL, FREET4, T3FREE, THYROIDAB in the last 72 hours. Anemia Panel: No results for input(s): VITAMINB12, FOLATE, FERRITIN, TIBC, IRON, RETICCTPCT in the last 72 hours.    Radiology Studies: I have reviewed all of the imaging during this hospital visit personally     Scheduled Meds: . amLODipine  2.5 mg Oral Daily   Continuous Infusions: . sodium chloride 75 mL/hr at 11/23/18 0533  . methocarbamol (ROBAXIN) IV       LOS: 0 days        Javian Nudd Annett Gula, MD

## 2018-11-23 NOTE — Transfer of Care (Signed)
Immediate Anesthesia Transfer of Care Note  Patient: Erica Vasquez  Procedure(s) Performed: ARTHROPLASTY BIPOLAR HIP (HEMIARTHROPLASTY) (Left )  Patient Location: PACU  Anesthesia Type:General  Level of Consciousness: awake and drowsy  Airway & Oxygen Therapy: Patient Spontanous Breathing and Patient connected to nasal cannula oxygen  Post-op Assessment: Report given to RN and Post -op Vital signs reviewed and stable  Post vital signs: Reviewed and stable  Last Vitals:  Vitals Value Taken Time  BP 126/68 11/23/2018  2:22 PM  Temp 36.5 C 11/23/2018  2:19 PM  Pulse 77 11/23/2018  2:32 PM  Resp 11 11/23/2018  2:32 PM  SpO2 94 % 11/23/2018  2:32 PM  Vitals shown include unvalidated device data.  Last Pain:  Vitals:   11/23/18 1419  TempSrc:   PainSc: Asleep         Complications: No apparent anesthesia complications

## 2018-11-23 NOTE — H&P (View-Only) (Signed)
Reason for Consult left femoral neck fracture Referring Physician: Arrien MD  Erica Vasquez is an 85 y.o. female.  HPI: 85 year old female minimal household ambulator was with her son who he walks across room to retrieve something she tried to stand up fell suffered a left femoral neck fracture.  Previous right femoral neck fracture April 2019 treated with prosthetic replacement bipolar which has done well.  Patient's problems with blindness cataracts Alzheimer disease dementia hypertension.  She likes to listen to music and is a minimal household ambulator uses a walker.  Son is at bedside.  Son's cousin also sits with patient and I talk with her by phone as well with outlined plan mentioned below.  Patient denies neck pain no abdominal pain no chills or fever.  Past Medical History:  Diagnosis Date  . Alzheimer disease    mild  . Arthritis   . Blind   . Cataract   . Cataract cortical, senile   . Dementia   . Essential hypertension   . Glaucoma   . Hypertension     Past Surgical History:  Procedure Laterality Date  . ABDOMINAL HYSTERECTOMY    . CATARACT EXTRACTION    . GLAUCOMA SURGERY Bilateral 2010   SLT  . TOTAL HIP ARTHROPLASTY Right 01/26/2018   Procedure: TOTAL HIP ARTHROPLASTY ANTERIOR APPROACH;  Surgeon: Tarry Kos, MD;  Location: [facility] OR;  Service: Orthopedics;  Laterality: Right;    Family History  Problem Relation Age of Onset  . Dementia Mother   . Stroke Mother   . Dementia Father   . Glaucoma Father   . Cataracts Father   . Hypertension Father   . Cataracts Sister   . Glaucoma Sister   . Hypertension Sister   . Retinal detachment Sister     Social History:  reports that she has never smoked. She has never used smokeless tobacco. She reports previous alcohol use. She reports previous drug use.  Allergies:  Allergies  Allergen Reactions  . Bimatoprost Itching  . Brimonidine Itching  . Timolol Itching    Medications: I have reviewed the  patient's current medications.  Results for orders placed or performed during the hospital encounter of 11/22/18 (from the past 48 hour(s))  Urinalysis, Routine w reflex microscopic     Status: Abnormal   Collection Time: 11/22/18 11:45 PM  Result Value Ref Range   Color, Urine YELLOW YELLOW   APPearance CLEAR CLEAR   Specific Gravity, Urine 1.015 1.005 - 1.030   pH 7.0 5.0 - 8.0   Glucose, UA NEGATIVE NEGATIVE mg/dL   Hgb urine dipstick SMALL (A) NEGATIVE   Bilirubin Urine NEGATIVE NEGATIVE   Ketones, ur 5 (A) NEGATIVE mg/dL   Protein, ur NEGATIVE NEGATIVE mg/dL   Nitrite NEGATIVE NEGATIVE   Leukocytes, UA NEGATIVE NEGATIVE   RBC / HPF 11-20 0 - 5 RBC/hpf   WBC, UA 0-5 0 - 5 WBC/hpf   Bacteria, UA NONE SEEN NONE SEEN   Squamous Epithelial / LPF 0-5 0 - 5   Mucus PRESENT     Comment: Performed at [facility], [address], [place], [place] [address]  CBC with Differential/Platelet     Status: Abnormal   Collection Time: 11/22/18 11:59 PM  Result Value Ref Range   WBC 6.4 4.0 - 10.5 K/uL   RBC 4.41 3.87 - 5.11 MIL/uL   Hemoglobin 11.8 (L) 12.0 - 15.0 g/dL   HCT 76.2 26.3 - 33.5 %   MCV 86.8  80.0 - 100.0 fL   MCH 26.8 26.0 - 34.0 pg   MCHC 30.8 30.0 - 36.0 g/dL   RDW 78.214.5 95.611.5 - 21.315.5 %   Platelets 190 150 - 400 K/uL   nRBC 0.0 0.0 - 0.2 %   Neutrophils Relative % 74 %   Neutro Abs 4.9 1.7 - 7.7 K/uL   Lymphocytes Relative 16 %   Lymphs Abs 1.0 0.7 - 4.0 K/uL   Monocytes Relative 7 %   Monocytes Absolute 0.5 0.1 - 1.0 K/uL   Eosinophils Relative 1 %   Eosinophils Absolute 0.0 0.0 - 0.5 K/uL   Basophils Relative 1 %   Basophils Absolute 0.0 0.0 - 0.1 K/uL   Immature Granulocytes 1 %   Abs Immature Granulocytes 0.03 0.00 - 0.07 K/uL    Comment: Performed at [facility], [address] [place], [place] [address]  Comprehensive metabolic panel     Status: Abnormal   Collection Time: 11/22/18 11:59 PM  Result Value Ref Range   Sodium 140 135 - 145 mmol/L     Potassium 4.1 3.5 - 5.1 mmol/L    Comment: SPECIMEN HEMOLYZED. HEMOLYSIS MAY AFFECT INTEGRITY OF RESULTS.   Chloride 103 98 - 111 mmol/L   CO2 20 (L) 22 - 32 mmol/L   Glucose, Bld 112 (H) 70 - 99 mg/dL   BUN 12 8 - 23 mg/dL   Creatinine, Ser 8.460.81 0.44 - 1.00 mg/dL   Calcium 8.8 (L) 8.9 - 10.3 mg/dL   Total Protein 5.6 (L) 6.5 - 8.1 g/dL   Albumin 3.4 (L) 3.5 - 5.0 g/dL   AST 29 15 - 41 U/L   ALT 10 0 - 44 U/L   Alkaline Phosphatase 42 38 - 126 U/L   Total Bilirubin 1.2 0.3 - 1.2 mg/dL   GFR calc non Af Amer >60 >60 mL/min   GFR calc Af Amer >60 >60 mL/min   Anion gap 17 (H) 5 - 15    Comment: Performed at [facility], [address] [place], [place] [address]  Troponin I - ONCE - STAT     Status: None   Collection Time: 11/22/18 11:59 PM  Result Value Ref Range   Troponin I <0.03 <0.03 ng/mL    Comment: Performed at [facility], [address] [place], [place] [address]  Protime-INR     Status: None   Collection Time: 11/23/18  2:47 AM  Result Value Ref Range   Prothrombin Time 13.2 11.4 - 15.2 seconds   INR 1.01     Comment: Performed at [facility], [address] [place], [place] [address]  Type and screen [facility]     Status: None   Collection Time: 11/23/18  4:11 AM  Result Value Ref Range   ABO/RH(D) O POS    Antibody Screen NEG    Sample Expiration      11/26/2018 Performed at [facility], [address] [place], [place] [address]   CBC     Status: Abnormal   Collection Time: 11/23/18  4:11 AM  Result Value Ref Range   WBC 5.6 4.0 - 10.5 K/uL   RBC 4.18 3.87 - 5.11 MIL/uL   Hemoglobin 11.3 (L) 12.0 - 15.0 g/dL   HCT 36.636.0 44.036.0 - 34.746.0 %   MCV 86.1 80.0 - 100.0 fL   MCH 27.0 26.0 - 34.0 pg   MCHC 31.4 30.0 - 36.0 g/dL  RDW 14.5 11.5 - 15.5 %   Platelets 155 150 - 400 K/uL   nRBC 0.0 0.0 - 0.2 %    Comment: Performed at [facility], [address] [place], [place] [address]  Basic metabolic panel      Status: Abnormal   Collection Time: 11/23/18  4:11 AM  Result Value Ref Range   Sodium 141 135 - 145 mmol/L   Potassium 3.3 (L) 3.5 - 5.1 mmol/L   Chloride 107 98 - 111 mmol/L   CO2 24 22 - 32 mmol/L   Glucose, Bld 129 (H) 70 - 99 mg/dL   BUN 8 8 - 23 mg/dL   Creatinine, Ser 6.040.65 0.44 - 1.00 mg/dL   Calcium 8.8 (L) 8.9 - 10.3 mg/dL   GFR calc non Af Amer >60 >60 mL/min   GFR calc Af Amer >60 >60 mL/min   Anion gap 10 5 - 15    Comment: Performed at [facility], [address] [place], [place] [address]    Dg Chest 1 View  Result Date: 11/23/2018 CLINICAL DATA:  Fall.  Hip deformity. EXAM: CHEST  1 VIEW COMPARISON:  None. FINDINGS: Cardiac silhouette is mildly enlarged, accentuated by rotation LEFT. Tortuous calcified aorta. No pleural effusion. Patchy RIGHT perihilar airspace opacity. Elevated RIGHT hemidiaphragm. Strandy densities LEFT lung base. No pneumothorax. Osteopenia. IMPRESSION: 1. Patchy RIGHT perihilar airspace opacity versus projectional artifact. LEFT lung base atelectasis/scar. Recommend follow-up PA and lateral views of the chest when clinically able. 2. Mild cardiomegaly. 3. Aortic Atherosclerosis (ICD10-I70.0). Electronically Signed   By: Awilda Metroourtnay  Bloomer M.D.   On: 11/23/2018 01:22   Dg Thoracic Spine 2 View  Result Date: 11/23/2018 CLINICAL DATA:  Pain after a fall. EXAM: THORACIC SPINE 2 VIEWS COMPARISON:  None. FINDINGS: Thoracic scoliosis convex towards the right. Diffuse bone demineralization. No anterior subluxations. Diffuse degenerative changes with narrowed interspaces and endplate hypertrophic change. No paraspinal soft tissue swelling. No focal bone lesion or bone destruction. IMPRESSION: Degenerative changes and scoliosis of the thoracic spine. No acute displaced fractures identified. Electronically Signed   By: Burman NievesWilliam  Stevens M.D.   On: 11/23/2018 01:27   Dg Lumbar Spine 2-3 Views  Result Date: 11/23/2018 CLINICAL DATA:  Pain after a fall. EXAM:  LUMBAR SPINE - 2-3 VIEW COMPARISON:  None. FINDINGS: Diffuse bone demineralization. Lumbar scoliosis convex towards the right. Diffuse degenerative changes with narrowed interspaces and endplate hypertrophic changes throughout. Anterior compression of the L1 vertebra with about 50% loss of height anteriorly. Suggestion of mild retropulsion of fracture fragments. No prior comparison studies are available in chronicity is indeterminate. Visualized sacrum appears intact. IMPRESSION: Age-indeterminate anterior compression of the L1 vertebra. Degenerative changes and scoliosis of the lumbar spine. Electronically Signed   By: Burman NievesWilliam  Stevens M.D.   On: 11/23/2018 01:26   Ct Head Wo Contrast  Result Date: 11/23/2018 CLINICAL DATA:  Patient fell out of wheelchair landing on left hip. Dementia. EXAM: CT HEAD WITHOUT CONTRAST CT CERVICAL SPINE WITHOUT CONTRAST TECHNIQUE: Multidetector CT imaging of the head and cervical spine was performed following the standard protocol without intravenous contrast. Multiplanar CT image reconstructions of the cervical spine were also generated. COMPARISON:  01/25/2018 CT head FINDINGS: CT HEAD FINDINGS Brain: Moderate degree of sulcal and ventricular prominence, stable in appearance consistent with atrophy. Mild-to-moderate small vessel ischemic disease of periventricular and subcortical white matter. No acute intracranial hemorrhage. No intra-axial mass nor extra-axial fluid. Midline fourth ventricle basal cisterns without effacement. Vascular: Moderate atherosclerosis  of the carotid siphons. No hyperdense vessel sign. Skull: Intact Sinuses/Orbits: Right cataract extraction. Intact orbits and globes. No acute sinus disease. Other: Clear mastoids. CT CERVICAL SPINE FINDINGS Alignment: Reversal of cervical lordosis attributable to multilevel degenerative disc disease. Intact craniocervical relationship. Intact atlantodental interval. Skull base and vertebrae: Intact skull base. No acute  cervical spine fracture. No jumped or perched facets. Soft tissues and spinal canal: No prevertebral fluid or swelling. No visible canal hematoma. Disc levels: Mild disc flattening C2-3 with moderate-to-marked disc flattening C3 through T1. Small posterior marginal osteophytes are identified with calcified central disc-osteophyte complex at C4-5. Bilateral uncovertebral joint osteoarthritis is seen C3 through C7. Mild bilateral C5-6 foraminal encroachment from uncinate spurring Upper chest: Ectatic aortic arch, partially imaged. The visualized lungs are nonacute. Other: None IMPRESSION: 1. Atrophy with chronic small vessel ischemic disease. No acute intracranial abnormality. 2. Cervical spondylosis without acute cervical spine fracture. Electronically Signed   By: Tollie Eth M.D.   On: 11/23/2018 01:00   Ct Cervical Spine Wo Contrast  Result Date: 11/23/2018 CLINICAL DATA:  Patient fell out of wheelchair landing on left hip. Dementia. EXAM: CT HEAD WITHOUT CONTRAST CT CERVICAL SPINE WITHOUT CONTRAST TECHNIQUE: Multidetector CT imaging of the head and cervical spine was performed following the standard protocol without intravenous contrast. Multiplanar CT image reconstructions of the cervical spine were also generated. COMPARISON:  01/25/2018 CT head FINDINGS: CT HEAD FINDINGS Brain: Moderate degree of sulcal and ventricular prominence, stable in appearance consistent with atrophy. Mild-to-moderate small vessel ischemic disease of periventricular and subcortical white matter. No acute intracranial hemorrhage. No intra-axial mass nor extra-axial fluid. Midline fourth ventricle basal cisterns without effacement. Vascular: Moderate atherosclerosis of the carotid siphons. No hyperdense vessel sign. Skull: Intact Sinuses/Orbits: Right cataract extraction. Intact orbits and globes. No acute sinus disease. Other: Clear mastoids. CT CERVICAL SPINE FINDINGS Alignment: Reversal of cervical lordosis attributable to  multilevel degenerative disc disease. Intact craniocervical relationship. Intact atlantodental interval. Skull base and vertebrae: Intact skull base. No acute cervical spine fracture. No jumped or perched facets. Soft tissues and spinal canal: No prevertebral fluid or swelling. No visible canal hematoma. Disc levels: Mild disc flattening C2-3 with moderate-to-marked disc flattening C3 through T1. Small posterior marginal osteophytes are identified with calcified central disc-osteophyte complex at C4-5. Bilateral uncovertebral joint osteoarthritis is seen C3 through C7. Mild bilateral C5-6 foraminal encroachment from uncinate spurring Upper chest: Ectatic aortic arch, partially imaged. The visualized lungs are nonacute. Other: None IMPRESSION: 1. Atrophy with chronic small vessel ischemic disease. No acute intracranial abnormality. 2. Cervical spondylosis without acute cervical spine fracture. Electronically Signed   By: Tollie Eth M.D.   On: 11/23/2018 01:00   Dg Hips Bilat W Or Wo Pelvis 3-4 Views  Result Date: 11/23/2018 CLINICAL DATA:  Fall. Hip deformity. EXAM: DG HIP (WITH OR WITHOUT PELVIS) 3-4V BILAT COMPARISON:  01/25/2018 FINDINGS: Diffuse bone demineralization. Pelvis appears intact. SI joints and symphysis pubis are not displaced. Right hip arthroplasty. Components appear well seated. Acute transverse subcapital fracture of the left proximal femur with varus angulation of the fracture fragments. No definite extension into the inter trochanteric region although the greater trochanteric margin is somewhat indistinct superiorly. Degenerative changes in the left hip and lower lumbar spine. Calcified phleboliths. Large amount of stool in the rectum. IMPRESSION: 1. Acute subcapital fracture of the left proximal femur with varus angulation. 2. Previous right hip arthroplasty.  Hardware intact. Electronically Signed   By: Burman Nieves M.D.   On: 11/23/2018 01:24  ROS 14 point systems updated negative  other than as mentioned above in HPI. Blood pressure (!) 165/87, pulse (!) 105, temperature 99.4 F (37.4 C), temperature source Axillary, resp. rate 15, SpO2 98 %. Physical Exam  Constitutional:  Thin, elderly eyes closed  Eyes:  Patient blind keeps her eyes closed.  Neck: No tracheal deviation present. No thyromegaly present.  Cardiovascular: Normal rate.  Respiratory: Effort normal.  GI: Soft. Bowel sounds are normal. She exhibits no distension. There is no abdominal tenderness.  Musculoskeletal:        General: No edema.  Neurological: She is alert.  Answers questions with short answers with her son at bedside.  Skin: Skin is warm and dry.    Assessment/Plan: Displaced left femoral neck fracture 1 year post right femoral neck fracture treated with prosthetic replacement.  Plan bipolar hemiarthroplasty.  Procedure discussed with son and other family members by phone.  Outline plan discussed with patient informed consent obtained they agree to proceed.  Eldred Manges 11/23/2018, 11:14 AM

## 2018-11-23 NOTE — Progress Notes (Signed)
Pt unable to utilize trapeze as ordered.  No trapeze set up at bedside.  Ortho Tech aware.  [id number]

## 2018-11-23 NOTE — Interval H&P Note (Signed)
History and Physical Interval Note:  11/23/2018 12:08 PM  Erica Vasquez  has presented today for surgery, with the diagnosis of left femoral neck fracture  The various methods of treatment have been discussed with the patient and family. After consideration of risks, benefits and other options for treatment, the patient has consented to  Procedure(s): ARTHROPLASTY BIPOLAR HIP (HEMIARTHROPLASTY) (Left) as a surgical intervention .  The patient's history has been reviewed, patient examined, no change in status, stable for surgery.  I have reviewed the patient's chart and labs.  Questions were answered to the patient's satisfaction.     Eldred Manges

## 2018-11-23 NOTE — Progress Notes (Signed)
Patient skin assessment completed by two RN's at the bedside upon arrival.  Patient presents with tennis ball size mass at the right upper chest. Noted on skin assessment on flow sheets. Nursing will continue to monitor.

## 2018-11-23 NOTE — Anesthesia Preprocedure Evaluation (Signed)
Anesthesia Evaluation  Patient identified by MRN, date of birth, ID band Patient confused    Reviewed: Allergy & Precautions, H&P , NPO status , Patient's Chart, lab work & pertinent test results  Airway Mallampati: II   Neck ROM: full    Dental   Pulmonary neg pulmonary ROS,    breath sounds clear to auscultation       Cardiovascular hypertension,  Rhythm:regular Rate:Normal     Neuro/Psych PSYCHIATRIC DISORDERS Dementia    GI/Hepatic   Endo/Other    Renal/GU      Musculoskeletal  (+) Arthritis ,   Abdominal   Peds  Hematology   Anesthesia Other Findings   Reproductive/Obstetrics                             Anesthesia Physical Anesthesia Plan  ASA: III  Anesthesia Plan: General   Post-op Pain Management:    Induction: Intravenous  PONV Risk Score and Plan: 3 and Ondansetron and Treatment may vary due to age or medical condition  Airway Management Planned: Oral ETT  Additional Equipment:   Intra-op Plan:   Post-operative Plan: Extubation in OR  Informed Consent: I have reviewed the patients History and Physical, chart, labs and discussed the procedure including the risks, benefits and alternatives for the proposed anesthesia with the patient or authorized representative who has indicated his/her understanding and acceptance.       Plan Discussed with: CRNA, Anesthesiologist and Surgeon  Anesthesia Plan Comments:         Anesthesia Quick Evaluation

## 2018-11-24 ENCOUNTER — Encounter (HOSPITAL_COMMUNITY): Payer: Self-pay | Admitting: Orthopaedic Surgery

## 2018-11-24 DIAGNOSIS — S72002A Fracture of unspecified part of neck of left femur, initial encounter for closed fracture: Secondary | ICD-10-CM

## 2018-11-24 LAB — CBC
HEMATOCRIT: 28.8 % — AB (ref 36.0–46.0)
HEMOGLOBIN: 9.3 g/dL — AB (ref 12.0–15.0)
MCH: 27.4 pg (ref 26.0–34.0)
MCHC: 32.3 g/dL (ref 30.0–36.0)
MCV: 84.7 fL (ref 80.0–100.0)
Platelets: 128 10*3/uL — ABNORMAL LOW (ref 150–400)
RBC: 3.4 MIL/uL — ABNORMAL LOW (ref 3.87–5.11)
RDW: 14.6 % (ref 11.5–15.5)
WBC: 4.9 10*3/uL (ref 4.0–10.5)
nRBC: 0 % (ref 0.0–0.2)

## 2018-11-24 LAB — BASIC METABOLIC PANEL
Anion gap: 10 (ref 5–15)
BUN: 12 mg/dL (ref 8–23)
CO2: 24 mmol/L (ref 22–32)
Calcium: 8.2 mg/dL — ABNORMAL LOW (ref 8.9–10.3)
Chloride: 107 mmol/L (ref 98–111)
Creatinine, Ser: 0.88 mg/dL (ref 0.44–1.00)
GFR calc Af Amer: >60 mL/min (ref >60)
GFR calc non Af Amer: 59 mL/min — ABNORMAL LOW (ref >60)
Glucose, Bld: 141 mg/dL — ABNORMAL HIGH (ref 70–99)
Potassium: 4 mmol/L (ref 3.5–5.1)
Sodium: 141 mmol/L (ref 135–145)

## 2018-11-24 NOTE — Plan of Care (Signed)
  Problem: Clinical Measurements: Goal: Postoperative complications will be avoided or minimized Outcome: Progressing   

## 2018-11-24 NOTE — Progress Notes (Signed)
PROGRESS NOTE        PATIENT DETAILS Name: Erica Vasquez Age: 85 y.o. Sex: female Date of Birth: 1929/10/26 Admit Date: 11/22/2018 Admitting Physician Haydee Monica, MD ZOX:WRUE, Earvin Hansen, MD  Brief Narrative: Patient is a 85 y.o. female with a hx of dementia, HTN, and right hip fracture that presented with a left femoral neck fracture. Pt underwent hemiarthroplasty and has been on our service for medical management as she awaits transfer to a rehabilitation facility. Due to the advanced nature of her dementia, she is not able to give a history, so the history was provided by family.  See below for more details.    Subjective: Unable to respond to questioning due to advanced dementia,  however she appears calm and comfortable.  No outward signs of acute distress.    Assessment/Plan: Principal Problem:  Hip fracture [facility]): patient underwent hemiarthroplasty yesterday, and there were no complications. The bandages covering the surgical site are clean and intact.  The plan is to arrange for her to enter a rehabilitation facility for further healing and strength building.    Continue to manage her pain with tramadol, tylenol, docusate sodium, celebrex. We are helping her stay hydrated with IV fluids.    Active Problems:  Essential hypertension, benign: controlled. Continue amlodipine.    Dementia: pt is unable to communicate and does not respond to any prompts from family or staff. Per family report, this is her baseline. We want to monitor her for signs of acute delirium, as she is at a high risk for this.    Hypokalemia: We have successfully resolved this. Will check at least one more time before discharge   Right breast mass: Family brought this to our attention today. It is firm, moveable, and located roughly at the 10:00 position. We have advised the family that they may arrange for a biopsy to be done, but given the pt's advanced age and current  deteriorated state she will not be a candidate for any aggressive cancer treatments. Family has stated that they do not wish to pursue any additional investigation.  Morning labs/Imaging ordered: CBC, BMP  DVT Prophylaxis: Lovenox  Code Status: Full code   Family Communication: Son at bedside. We discussed the plan with him and he has no further questions at this time.    Disposition Plan: Remain inpatient awaiting placement to a rehabilitation facility  Antimicrobial agents: Anti-infectives (From admission, onward)   Start     Dose/Rate Route Frequency Ordered Stop   11/24/18 0600  ceFAZolin (ANCEF) IVPB 2g/100 mL premix     2 g 200 mL/hr over 30 Minutes Intravenous To Surgery 11/23/18 1643 11/24/18 0650   11/23/18 1645  ceFAZolin (ANCEF) IVPB 2g/100 mL premix     2 g 200 mL/hr over 30 Minutes Intravenous Every 6 hours 11/23/18 1644 11/24/18 0444   11/23/18 1224  ceFAZolin (ANCEF) 2-4 GM/100ML-% IVPB    Note to Pharmacy:  [facility] [place]   : cabinet override      11/23/18 1224 11/24/18 0029      MEDICATIONS: Scheduled Meds: . acetaminophen  500 mg Oral Q6H  . amLODipine  2.5 mg Oral Daily  . aspirin EC  325 mg Oral Q breakfast  . celecoxib  200 mg Oral BID  . docusate sodium  100 mg Oral BID  . enoxaparin (LOVENOX) injection  40  mg Subcutaneous Q24H  . pantoprazole  40 mg Oral Daily  . potassium chloride  40 mEq Oral Once  . povidone-iodine  2 application Topical Once  . traMADol  50 mg Oral Q6H   Continuous Infusions: . 0.45 % NaCl with KCl 20 mEq / L 75 mL (11/23/18 2158)  . methocarbamol (ROBAXIN) IV     PRN Meds:.acetaminophen, HYDROcodone-acetaminophen, menthol-cetylpyridinium **OR** phenol, methocarbamol **OR** methocarbamol (ROBAXIN) IV, metoCLOPramide **OR** metoCLOPramide (REGLAN) injection, morphine injection, ondansetron **OR** ondansetron (ZOFRAN) IV   PHYSICAL EXAM: Vital signs: Vitals:   11/23/18 1545 11/23/18 1600 11/23/18 2040 11/24/18 0555  BP:  134/71 (!) 151/76 (!) 155/84 125/69  Pulse: 71 88 (!) 103 90  Resp: 20 15    Temp:  97.7 F (36.5 C) 97.7 F (36.5 C) 98.4 F (36.9 C)  TempSrc:   Oral Oral  SpO2: 99% 97% 100% 100%   There were no vitals filed for this visit. There is no height or weight on file to calculate BMI.   General appearance : Frail elderly lady that is largely unresponsive to stimuli.   Eyes: pt unable to open eyes, not visualized HEENT: Atraumatic and Normocephali Resp:Good air entry bilaterally, no added sounds  CVS: S1 S2 regular, no murmurs.  GI: Bowel sounds present, Non tender and not distended with no gaurding, rigidity or rebound.No organomegaly Extremities: B/L Lower Ext shows no edema, both legs are warm to touch Neurology:  Significant cognitive impairment secondary to her advanced dementia. Musculoskeletal:No digital cyanosis Skin:No Rash, warm and dry Wounds: Bandaging over surgical site clean and intact.    I have personally reviewed following labs and imaging studies  LABORATORY DATA: CBC: Recent Labs  Lab 11/22/18 2359 11/23/18 0411 11/24/18 0428  WBC 6.4 5.6 4.9  NEUTROABS 4.9  --   --   HGB 11.8* 11.3* 9.3*  HCT 38.3 36.0 28.8*  MCV 86.8 86.1 84.7  PLT 190 155 128*    Basic Metabolic Panel: Recent Labs  Lab 11/22/18 2359 11/23/18 0411 11/24/18 0428  NA 140 141 141  K 4.1 3.3* 4.0  CL 103 107 107  CO2 20* 24 24  GLUCOSE 112* 129* 141*  BUN 12 8 12   CREATININE 0.81 0.65 0.88  CALCIUM 8.8* 8.8* 8.2*    GFR: CrCl cannot be calculated (Unknown ideal weight.).  Liver Function Tests: Recent Labs  Lab 11/22/18 2359  AST 29  ALT 10  ALKPHOS 42  BILITOT 1.2  PROT 5.6*  ALBUMIN 3.4*   Coagulation Profile: Recent Labs  Lab 11/23/18 0247  INR 1.01    Cardiac Enzymes: Recent Labs  Lab 11/22/18 2359  TROPONINI <0.03   Urine analysis:    Component Value Date/Time   COLORURINE YELLOW 11/22/2018 2345   APPEARANCEUR CLEAR 11/22/2018 2345   LABSPEC  1.015 11/22/2018 2345   PHURINE 7.0 11/22/2018 2345   GLUCOSEU NEGATIVE 11/22/2018 2345   HGBUR SMALL (A) 11/22/2018 2345   BILIRUBINUR NEGATIVE 11/22/2018 2345   KETONESUR 5 (A) 11/22/2018 2345   PROTEINUR NEGATIVE 11/22/2018 2345   NITRITE NEGATIVE 11/22/2018 2345   LEUKOCYTESUR NEGATIVE 11/22/2018 2345   MICROBIOLOGY: Recent Results (from the past 240 hour(s))  Surgical PCR screen     Status: None   Collection Time: 11/23/18  1:11 PM  Result Value Ref Range Status   MRSA, PCR NEGATIVE NEGATIVE Final   Staphylococcus aureus NEGATIVE NEGATIVE Final    Comment: (NOTE) The Xpert SA Assay (FDA approved for NASAL specimens in patients 71 years of age  and older), is one component of a comprehensive surveillance program. It is not intended to diagnose infection nor to guide or monitor treatment. Performed at [facility], [address] [place], [place] [address]     RADIOLOGY STUDIES/RESULTS: Dg Chest 1 View  Result Date: 11/23/2018 CLINICAL DATA:  Fall.  Hip deformity. EXAM: CHEST  1 VIEW COMPARISON:  None. FINDINGS: Cardiac silhouette is mildly enlarged, accentuated by rotation LEFT. Tortuous calcified aorta. No pleural effusion. Patchy RIGHT perihilar airspace opacity. Elevated RIGHT hemidiaphragm. Strandy densities LEFT lung base. No pneumothorax. Osteopenia. IMPRESSION: 1. Patchy RIGHT perihilar airspace opacity versus projectional artifact. LEFT lung base atelectasis/scar. Recommend follow-up PA and lateral views of the chest when clinically able. 2. Mild cardiomegaly. 3. Aortic Atherosclerosis (ICD10-I70.0). Electronically Signed   By: Awilda Metroourtnay  Bloomer M.D.   On: 11/23/2018 01:22   Dg Thoracic Spine 2 View  Result Date: 11/23/2018 CLINICAL DATA:  Pain after a fall. EXAM: THORACIC SPINE 2 VIEWS COMPARISON:  None. FINDINGS: Thoracic scoliosis convex towards the right. Diffuse bone demineralization. No anterior subluxations. Diffuse degenerative changes with narrowed  interspaces and endplate hypertrophic change. No paraspinal soft tissue swelling. No focal bone lesion or bone destruction. IMPRESSION: Degenerative changes and scoliosis of the thoracic spine. No acute displaced fractures identified. Electronically Signed   By: Burman NievesWilliam  Stevens M.D.   On: 11/23/2018 01:27   Dg Lumbar Spine 2-3 Views  Result Date: 11/23/2018 CLINICAL DATA:  Pain after a fall. EXAM: LUMBAR SPINE - 2-3 VIEW COMPARISON:  None. FINDINGS: Diffuse bone demineralization. Lumbar scoliosis convex towards the right. Diffuse degenerative changes with narrowed interspaces and endplate hypertrophic changes throughout. Anterior compression of the L1 vertebra with about 50% loss of height anteriorly. Suggestion of mild retropulsion of fracture fragments. No prior comparison studies are available in chronicity is indeterminate. Visualized sacrum appears intact. IMPRESSION: Age-indeterminate anterior compression of the L1 vertebra. Degenerative changes and scoliosis of the lumbar spine. Electronically Signed   By: Burman NievesWilliam  Stevens M.D.   On: 11/23/2018 01:26   Ct Head Wo Contrast  Result Date: 11/23/2018 CLINICAL DATA:  Patient fell out of wheelchair landing on left hip. Dementia. EXAM: CT HEAD WITHOUT CONTRAST CT CERVICAL SPINE WITHOUT CONTRAST TECHNIQUE: Multidetector CT imaging of the head and cervical spine was performed following the standard protocol without intravenous contrast. Multiplanar CT image reconstructions of the cervical spine were also generated. COMPARISON:  01/25/2018 CT head FINDINGS: CT HEAD FINDINGS Brain: Moderate degree of sulcal and ventricular prominence, stable in appearance consistent with atrophy. Mild-to-moderate small vessel ischemic disease of periventricular and subcortical white matter. No acute intracranial hemorrhage. No intra-axial mass nor extra-axial fluid. Midline fourth ventricle basal cisterns without effacement. Vascular: Moderate atherosclerosis of the carotid  siphons. No hyperdense vessel sign. Skull: Intact Sinuses/Orbits: Right cataract extraction. Intact orbits and globes. No acute sinus disease. Other: Clear mastoids. CT CERVICAL SPINE FINDINGS Alignment: Reversal of cervical lordosis attributable to multilevel degenerative disc disease. Intact craniocervical relationship. Intact atlantodental interval. Skull base and vertebrae: Intact skull base. No acute cervical spine fracture. No jumped or perched facets. Soft tissues and spinal canal: No prevertebral fluid or swelling. No visible canal hematoma. Disc levels: Mild disc flattening C2-3 with moderate-to-marked disc flattening C3 through T1. Small posterior marginal osteophytes are identified with calcified central disc-osteophyte complex at C4-5. Bilateral uncovertebral joint osteoarthritis is seen C3 through C7. Mild bilateral C5-6 foraminal encroachment from uncinate spurring Upper chest: Ectatic aortic arch, partially imaged. The visualized lungs are nonacute. Other: None IMPRESSION: 1.  Atrophy with chronic small vessel ischemic disease. No acute intracranial abnormality. 2. Cervical spondylosis without acute cervical spine fracture. Electronically Signed   By: Tollie Ethavid  Kwon M.D.   On: 11/23/2018 01:00   Ct Cervical Spine Wo Contrast  Result Date: 11/23/2018 CLINICAL DATA:  Patient fell out of wheelchair landing on left hip. Dementia. EXAM: CT HEAD WITHOUT CONTRAST CT CERVICAL SPINE WITHOUT CONTRAST TECHNIQUE: Multidetector CT imaging of the head and cervical spine was performed following the standard protocol without intravenous contrast. Multiplanar CT image reconstructions of the cervical spine were also generated. COMPARISON:  01/25/2018 CT head FINDINGS: CT HEAD FINDINGS Brain: Moderate degree of sulcal and ventricular prominence, stable in appearance consistent with atrophy. Mild-to-moderate small vessel ischemic disease of periventricular and subcortical white matter. No acute intracranial hemorrhage. No  intra-axial mass nor extra-axial fluid. Midline fourth ventricle basal cisterns without effacement. Vascular: Moderate atherosclerosis of the carotid siphons. No hyperdense vessel sign. Skull: Intact Sinuses/Orbits: Right cataract extraction. Intact orbits and globes. No acute sinus disease. Other: Clear mastoids. CT CERVICAL SPINE FINDINGS Alignment: Reversal of cervical lordosis attributable to multilevel degenerative disc disease. Intact craniocervical relationship. Intact atlantodental interval. Skull base and vertebrae: Intact skull base. No acute cervical spine fracture. No jumped or perched facets. Soft tissues and spinal canal: No prevertebral fluid or swelling. No visible canal hematoma. Disc levels: Mild disc flattening C2-3 with moderate-to-marked disc flattening C3 through T1. Small posterior marginal osteophytes are identified with calcified central disc-osteophyte complex at C4-5. Bilateral uncovertebral joint osteoarthritis is seen C3 through C7. Mild bilateral C5-6 foraminal encroachment from uncinate spurring Upper chest: Ectatic aortic arch, partially imaged. The visualized lungs are nonacute. Other: None IMPRESSION: 1. Atrophy with chronic small vessel ischemic disease. No acute intracranial abnormality. 2. Cervical spondylosis without acute cervical spine fracture. Electronically Signed   By: Tollie Ethavid  Kwon M.D.   On: 11/23/2018 01:00   Dg Hip Port Unilat With Pelvis 1v Left  Result Date: 11/23/2018 CLINICAL DATA:  Left hip arthroplasty. EXAM: DG HIP (WITH OR WITHOUT PELVIS) 1V PORT LEFT COMPARISON:  Hip radiographs earlier today FINDINGS: A single portable radiograph centered over the left femur demonstrates interval performance of a left hip arthroplasty. The prosthetic femoral head was incompletely imaged. No acute fracture is seen about the femoral stem. IMPRESSION: Interval left hip arthroplasty as above without acute osseous abnormality identified. Electronically Signed   By: Sebastian AcheAllen  Grady  M.D.   On: 11/23/2018 15:46   Dg Hip Unilat With Pelvis 2-3 Views Left  Result Date: 11/23/2018 CLINICAL DATA:  Status post left hip hemiarthroplasty. EXAM: DG HIP (WITH OR WITHOUT PELVIS) 2-3V LEFT COMPARISON:  None. FINDINGS: Left hip hemiarthroplasty appears well-seated and well aligned. There is no acute fracture or evidence of an operative complication. Well-positioned right hip hemiarthroplasty. Bones are diffusely demineralized. IMPRESSION: Well-positioned left hip hemiarthroplasty. Electronically Signed   By: Amie Portlandavid  Ormond M.D.   On: 11/23/2018 16:43   Dg Hips Bilat W Or Wo Pelvis 3-4 Views  Result Date: 11/23/2018 CLINICAL DATA:  Fall. Hip deformity. EXAM: DG HIP (WITH OR WITHOUT PELVIS) 3-4V BILAT COMPARISON:  01/25/2018 FINDINGS: Diffuse bone demineralization. Pelvis appears intact. SI joints and symphysis pubis are not displaced. Right hip arthroplasty. Components appear well seated. Acute transverse subcapital fracture of the left proximal femur with varus angulation of the fracture fragments. No definite extension into the inter trochanteric region although the greater trochanteric margin is somewhat indistinct superiorly. Degenerative changes in the left hip and lower lumbar spine. Calcified phleboliths. Large  amount of stool in the rectum. IMPRESSION: 1. Acute subcapital fracture of the left proximal femur with varus angulation. 2. Previous right hip arthroplasty.  Hardware intact. Electronically Signed   By: Burman Nieves M.D.   On: 11/23/2018 01:24     LOS: 1 day   Laurette Schimke, PA-S [facility]  If 7PM-7AM, please contact night-coverage  Please page via [email or web address] [email or web address]-click on MD name and type text message  11/24/2018, 1:07 PM

## 2018-11-24 NOTE — Progress Notes (Addendum)
Occupational Therapy Evaluation Patient Details Name: Erica Vasquez MRN: [id number] DOB: 10/19/1930 Today's Date: 11/24/2018    History of Present Illness Pt is a 85 year old female suffered a left femoral neck fracture. PMH Alzheimer disease, Dementia, Blind, and cataract.    Clinical Impression   PTA Pt PLOF Max A to in home setting in all ADLs. Pt lives at home with son and requires assistance due to cognitive impairments, blindness, and decreased mobility/strength. Pt currently required Total A in all ADLs and functional transfers due to pain, decreased ROM/ strength, and visual impairments. Pt will benefit from continued skilled OT in home setting due to cognitive impairments of Alzheimers and Dementia, pt will feel safe in a natural and familiar environment. She has ample 24 hr support in home setting from son as well as additional family support as needed. DC recommendation Home health OT with continued supervision.   Next session to address, pt/ caregiver education/ demonstration of functional transfers to prepare for home setting.     Follow Up Recommendations  Home health OT;Supervision/Assistance - 24 hour    Equipment Recommendations  None recommended by OT    Recommendations for Other Services       Precautions / Restrictions Precautions Precautions: Posterior Hip Precaution Booklet Issued: Yes (comment) Precaution Comments: Pt in L KI, PT elected to keep on given pt dementia, hx of crossing legs, and anatomical tendency for hip IR  Required Braces or Orthoses: Knee Immobilizer - Left Knee Immobilizer - Left: Other (comment)(to be removed today) Restrictions Weight Bearing Restrictions: No LLE Weight Bearing: Weight bearing as tolerated      Mobility Bed Mobility Overal bed mobility: Needs Assistance Bed Mobility: Supine to Sit     Supine to sit: Total assist;+2 for physical assistance;HOB elevated     General bed mobility comments: Pt required VCs for  sequence of bed mob to ensure understanding of task. Pt required Min A to Mod A to up at EOB with BUE for support due to low core strength.   Transfers Overall transfer level: Needs assistance Equipment used: 2 person hand held assist Transfers: Lateral/Scoot Transfers          Lateral/Scoot Transfers: Total assist      Balance Overall balance assessment: History of Falls;Needs assistance Sitting-balance support: Feet supported;Bilateral upper extremity supported Sitting balance-Leahy Scale: Poor Sitting balance - Comments: pt initially requires mod A for balance able to progress to min guard assist                                   ADL either performed or assessed with clinical judgement   ADL Overall ADL's : Needs assistance/impaired Eating/Feeding: Maximal assistance   Grooming: Maximal assistance   Upper Body Bathing: Maximal assistance   Lower Body Bathing: Total assistance   Upper Body Dressing : Maximal assistance   Lower Body Dressing: Total assistance   Toilet Transfer: Total assistance Toilet Transfer Details (indicate cue type and reason): Pt simulated toilet transfer from bed to recliner, +2 for safety, use of bed pad for additional assistance. Toileting- Clothing Manipulation and Hygiene: Maximal assistance       Functional mobility during ADLs: +2 for physical assistance;Total assistance General ADL Comments: Pt is Max A to total in all ADLs due to weakness, pain, and visual deficits.      Vision Baseline Vision/History: Cataracts(Totally blind) Patient Visual Report: Other (comment) Vision Assessment?: Vision impaired- to be  further tested in functional context Additional Comments: Pt suffers from blindness     Perception     Praxis      Pertinent Vitals/Pain Pain Assessment: Faces Faces Pain Scale: Hurts even more Pain Location: L hip Pain Descriptors / Indicators: Moaning;Nagging Pain Intervention(s): Limited activity within  patient's tolerance;Monitored during session;Repositioned     Hand Dominance     Extremity/Trunk Assessment Upper Extremity Assessment Upper Extremity Assessment: Defer to OT evaluation   Lower Extremity Assessment Lower Extremity Assessment: LLE deficits/detail;Difficult to assess due to impaired cognition LLE Deficits / Details: L hip hemiplasty, in KI  LLE: Unable to fully assess due to pain;Unable to fully assess due to immobilization       Communication Communication Communication: Other (comment)(no understandable speech on eval)   Cognition Arousal/Alertness: Suspect due to medications Behavior During Therapy: Restless Overall Cognitive Status: History of cognitive impairments - at baseline                                 General Comments: hx of Alzheimers   General Comments  Son Erica Vasquez) present throughout session, able to provide all PLOF and Home set up questions    Exercises     Shoulder Instructions      Home Living Family/patient expects to be discharged to:: Private residence Living Arrangements: Children Available Help at Discharge: Family;Available 24 hours/day Type of Home: House Home Access: Stairs to enter [employer] of Steps: 2   Home Layout: 1/2 bath on main level;Able to live on main level with bedroom/bathroom;Two level         Bathroom Toilet: Standard Bathroom Accessibility: Yes   Home Equipment: Hospital bed;Grab bars - toilet;Bedside commode;Walker - 2 wheels;Transport chair   Additional Comments: Pt's son provide ample support as well as additional family as needed.      Prior Functioning/Environment Level of Independence: Needs assistance  Gait / Transfers Assistance Needed: PTA pt was able to ambulate with assistance from son, requires assist for transfers to transport chair ADL's / Homemaking Assistance Needed: needed assistance   Comments: pt was never alone PTA.        OT Problem List: Decreased  activity tolerance;Impaired balance (sitting and/or standing);Decreased cognition;Decreased knowledge of use of DME or AE;Decreased knowledge of precautions      OT Treatment/Interventions: Self-care/ADL training;Therapeutic exercise;DME and/or AE instruction;Patient/family education    OT Goals(Current goals can be found in the care plan section) Acute Rehab OT Goals Patient Stated Goal: Caregiver states wanting the best care for her. OT Goal Formulation: With family Time For Goal Achievement: 12/08/18 Potential to Achieve Goals: Fair ADL Goals Pt Will Perform Lower Body Bathing: with max assist;bed level Pt Will Perform Lower Body Dressing: with max assist;bed level Pt Will Transfer to Toilet: with max assist;bedside commode;ambulating Pt Will Perform Toileting - Clothing Manipulation and hygiene: with max assist;sit to/from stand Pt Will Perform Tub/Shower Transfer: with max assist  OT Frequency: Min 2X/week   Barriers to D/C:            Co-evaluation PT/OT/SLP Co-Evaluation/Treatment: Yes Reason for Co-Treatment: Complexity of the patient's impairments (multi-system involvement) PT goals addressed during session: Mobility/safety with mobility OT goals addressed during session: ADL's and self-care      AM-PAC OT "6 Clicks" Daily Activity     Outcome Measure Help from another person eating meals?: A Lot Help from another person taking care of personal grooming?: A Lot Help  from another person toileting, which includes using toliet, bedpan, or urinal?: Total Help from another person bathing (including washing, rinsing, drying)?: Total Help from another person to put on and taking off regular upper body clothing?: Total Help from another person to put on and taking off regular lower body clothing?: Total 6 Click Score: 8   End of Session Equipment Utilized During Treatment: Left knee immobilizer(To decrease adduction of LLE. ) Nurse Communication: Mobility  status;Precautions  Activity Tolerance: Treatment limited secondary to medical complications (Comment) Patient left: in chair;with call bell/phone within reach;with family/visitor present  OT Visit Diagnosis: Repeated falls (R29.6);Other abnormalities of gait and mobility (R26.89)                Time: [id number] OT Time Calculation (min): 49 min Charges:  OT General Charges $OT Visit: 1 Visit OT Evaluation $OT Eval Moderate Complexity: 1 Mod OT Treatments $Self Care/Home Management : 23-37 mins  Erica Vasquez, MSOT, OTR/L  [facility] Rehabilitation [facility]  [phone]  Erica Vasquez 11/24/2018, 11:52 AM

## 2018-11-24 NOTE — Evaluation (Signed)
Physical Therapy Evaluation Patient Details Name: Erica Vasquez MRN: [id number] DOB: 1930-05-06 Today's Date: 11/24/2018   History of Present Illness  Pt is a 85 year old female suffered a left femoral neck fracture. PMH Alzheimer disease, Dementia, Blind, and cataract.   Clinical Impression  PTA pt living with son in 2 story home with 2 steps to enter. Son and other family members provide assist for short distance ambulation, and transfers from bed, and transport chair. Pt with limited ability to perform ADLs and son takes care of iADLs. Pt very lethargic today from pain medications and unable to fully participate in evaluation requiring TotalA for supine to sit where she was able to progress from modA to balance to min guard assist. Pt then required TotalA to transfer to chair. Given pt's dementia, blindness and sons current level of assistance for pt's care, PT recommending pt d/c home with HHPT to build strength and mobility in her home environment. PT will continue to follow acutely.     Follow Up Recommendations Home health PT;Supervision/Assistance - 24 hour    Equipment Recommendations  None recommended by PT    Recommendations for Other Services       Precautions / Restrictions Precautions Precautions: Posterior Hip Precaution Booklet Issued: Yes (comment) Precaution Comments: Pt in L KI, PT elected to keep on given pt dementia, hx of crossing legs, and anatomical tendency for hip IR  Required Braces or Orthoses: Knee Immobilizer - Left Knee Immobilizer - Left: Other (comment)(to be removed today) Restrictions Weight Bearing Restrictions: No LLE Weight Bearing: Weight bearing as tolerated      Mobility  Bed Mobility Overal bed mobility: Needs Assistance Bed Mobility: Supine to Sit     Supine to sit: Total assist;+2 for physical assistance;HOB elevated     General bed mobility comments: Pt required VCs for sequence of bed mob to ensure understanding of task. Pt  required Min A to Mod A to up at EOB with BUE for support due to low core strength.   Transfers Overall transfer level: Needs assistance Equipment used: 2 person hand held assist Transfers: Lateral/Scoot Transfers          Lateral/Scoot Transfers: Total assist          Balance Overall balance assessment: History of Falls;Needs assistance Sitting-balance support: Feet supported;Bilateral upper extremity supported Sitting balance-Leahy Scale: Poor Sitting balance - Comments: pt initially requires mod A for balance able to progress to min guard assist                                     Pertinent Vitals/Pain Pain Assessment: Faces Faces Pain Scale: Hurts even more Pain Location: L hip Pain Descriptors / Indicators: Moaning;Nagging Pain Intervention(s): Limited activity within patient's tolerance;Monitored during session;Repositioned    Home Living Family/patient expects to be discharged to:: Private residence Living Arrangements: Children Available Help at Discharge: Family;Available 24 hours/day Type of Home: House Home Access: Stairs to enter   [employer] of Steps: 2 Home Layout: 1/2 bath on main level;Able to live on main level with bedroom/bathroom;Two level Home Equipment: Hospital bed;Grab bars - toilet;Bedside commode;Walker - 2 wheels;Transport chair Additional Comments: Pt's son provide ample support as well as additional family as needed.    Prior Function Level of Independence: Needs assistance   Gait / Transfers Assistance Needed: PTA pt was able to ambulate with assistance from son, requires assist for transfers to transport chair  ADL's / Homemaking Assistance Needed: needed assistance  Comments: pt was never alone PTA.     Hand Dominance        Extremity/Trunk Assessment   Upper Extremity Assessment Upper Extremity Assessment: Defer to OT evaluation    Lower Extremity Assessment Lower Extremity Assessment: LLE  deficits/detail;Difficult to assess due to impaired cognition LLE Deficits / Details: L hip hemiplasty, in KI  LLE: Unable to fully assess due to pain;Unable to fully assess due to immobilization       Communication   Communication: Other (comment)(no understandable speech on eval)  Cognition Arousal/Alertness: Suspect due to medications Behavior During Therapy: Restless Overall Cognitive Status: History of cognitive impairments - at baseline                                 General Comments: hx of Alzheimers      General Comments General comments (skin integrity, edema, etc.): Son Jonny Ruiz(John) present throughout session, able to provide all PLOF and Home set up questions        Assessment/Plan    PT Assessment Patient needs continued PT services  PT Problem List Decreased cognition;Decreased activity tolerance;Decreased knowledge of precautions;Pain       PT Treatment Interventions DME instruction;Gait training;Functional mobility training;Therapeutic activities;Therapeutic exercise;Balance training;Patient/family education;Cognitive remediation    PT Goals (Current goals can be found in the Care Plan section)  Acute Rehab PT Goals Patient Stated Goal: Caregiver states wanting the best care for her. PT Goal Formulation: Patient unable to participate in goal setting Time For Goal Achievement: 12/08/18 Potential to Achieve Goals: Fair    Frequency Min 5X/week        Co-evaluation PT/OT/SLP Co-Evaluation/Treatment: Yes Reason for Co-Treatment: Complexity of the patient's impairments (multi-system involvement) PT goals addressed during session: Mobility/safety with mobility OT goals addressed during session: ADL's and self-care       AM-PAC PT "6 Clicks" Mobility  Outcome Measure Help needed turning from your back to your side while in a flat bed without using bedrails?: Total Help needed moving from lying on your back to sitting on the side of a flat bed  without using bedrails?: Total Help needed moving to and from a bed to a chair (including a wheelchair)?: Total Help needed standing up from a chair using your arms (e.g., wheelchair or bedside chair)?: Total Help needed to walk in hospital room?: Total Help needed climbing 3-5 steps with a railing? : Total 6 Click Score: 6    End of Session Equipment Utilized During Treatment: Left knee immobilizer;Gait belt Activity Tolerance: Patient limited by lethargy Patient left: in chair;with call bell/phone within reach;with chair alarm set;with family/visitor present Nurse Communication: Mobility status;Precautions;Weight bearing status PT Visit Diagnosis: Unsteadiness on feet (R26.81);Other abnormalities of gait and mobility (R26.89);Muscle weakness (generalized) (M62.81);History of falling (Z91.81);Difficulty in walking, not elsewhere classified (R26.2);Pain;Other symptoms and signs involving the nervous system (R29.898) Pain - Right/Left: Left Pain - part of body: Hip    Time: [id number] PT Time Calculation (min) (ACUTE ONLY): 49 min   Charges:   PT Evaluation $PT Eval Moderate Complexity: 1 Mod          Kristain Hu B. Beverely RisenVan Fleet PT, DPT Acute Rehabilitation Services Pager [phone] Office [phone]) [phone]   Elon Alaslizabeth B Van Fleet 11/24/2018, 11:42 AM

## 2018-11-24 NOTE — Progress Notes (Signed)
PROGRESS NOTE    Erica Vasquez  SWN:[id number] DOB: 09/10/1930 DOA: 11/22/2018 PCP: Mirna Mires, MD    Brief Narrative:  85 year old female who presented after mechanical fall.  She does have significant dementia along with history of hypertension and ambulatory dysfunction, wheelchair-bound.  Apparently while trying to get out of her wheelchair she sustained a mechanical fall, injuring her left hip. History was limited due to patient's dementia.  On her initial physical examination blood pressure 145/64, heart rate 95, respiratory rate 14, oxygen saturation 97%, she had moist mucous membranes, her lungs were clear to auscultation bilaterally, heart S1-S2 present rhythmic abdomen was soft nontender, no lower extremity edema.  Her left femur was angulated.  Sodium 141, potassium 3.3, chloride 107, bicarb 24, glucose 129, BUN 8, creatinine 0.65, white count 5.6, hemoglobin 9.3, hematocrit 36.0, platelets 155.  Urine analysis negative for infection.  Head and cervical CT no acute changes.  Chest x-ray with bibasilar atelectasis.  Hip films with acute subcapital fracture of the left proximal femur with varus angulation.  Previous right hip arthroplasty, hardware intact.  EKG with normal sinus rhythm, left axis deviation, left anterior fascicular block, poor R wave progression.  Patient was admitted to the hospital working diagnosis of left proximal femur fracture.    Assessment & Plan:   Principal Problem:   Hip fracture [facility]) Active Problems:   Fall   Essential hypertension, benign   Alzheimer disease (HCC)   1. Left proximal femur fracture.  Pain control with morphine, hydrocodone, celecoxib, and acetaminophen. Plan to transfer to snf for rehab. Continue post op care per orthopedics.   2. HTN. Blood pressure control with amlodipine, blood pressure systolic 155 mmHg.   3. Dementia. Not much interactive today, somnolent, most information from her family at the bedside.  4.  Hypokalemia. Has been corrected with Kcl, this am K up to 4,0 with a preserved renal function, will hold on IV fluids.   5. Right breast mass. Non tender and hard right breast mass, mobile. Has been evaluated by her primary care, and decision was made for conservative management and not to proceed with invasive testing, considering her advance age and dementia, approach that is reasonable at this point,   DVT prophylaxis: enoxaparin   Code Status: full Family Communication: I spoke with patient's son at the bedside and all questions were addressed.  There is no height or weight on file to calculate BMI. Malnutrition Type:      Malnutrition Characteristics:      Nutrition Interventions:     RN Pressure Injury Documentation:     Consultants:   orthopedics  Procedures:     Antimicrobials:       Subjective: Patient is somnolent and poorly interactive, no apparent pain or dyspnea. Per her son at the bedside this am she was more awake and alert.   Objective: Vitals:   11/23/18 1545 11/23/18 1600 11/23/18 2040 11/24/18 0555  BP: 134/71 (!) 151/76 (!) 155/84 125/69  Pulse: 71 88 (!) 103 90  Resp: 20 15    Temp:  97.7 F (36.5 C) 97.7 F (36.5 C) 98.4 F (36.9 C)  TempSrc:   Oral Oral  SpO2: 99% 97% 100% 100%    Intake/Output Summary (Last 24 hours) at 11/24/2018 1450 Last data filed at 11/24/2018 0900 Gross per 24 hour  Intake 822.5 ml  Output -  Net 822.5 ml   There were no vitals filed for this visit.  Examination:   General: Not in pain  or dyspnea, deconditioned.  Neurology: somnolent  E ZOX:WRUEENT:with mild pallor, no icterus, oral mucosa moist Cardiovascular: No JVD. S1-S2 present, rhythmic, no gallops, rubs, or murmurs. Trace lower extremity edema. Pulmonary: decreased breath sounds bilaterally, adequate air movement, no wheezing, rhonchi or rales. Gastrointestinal. Abdomen with no organomegaly, non tender, no rebound or guarding Skin. No  rashes Musculoskeletal: no joint deformities Right breast with a large hard mass, mobile non tender, with no skin changes, noted.      Data Reviewed: I have personally reviewed following labs and imaging studies  CBC: Recent Labs  Lab 11/22/18 2359 11/23/18 0411 11/24/18 0428  WBC 6.4 5.6 4.9  NEUTROABS 4.9  --   --   HGB 11.8* 11.3* 9.3*  HCT 38.3 36.0 28.8*  MCV 86.8 86.1 84.7  PLT 190 155 128*   Basic Metabolic Panel: Recent Labs  Lab 11/22/18 2359 11/23/18 0411 11/24/18 0428  NA 140 141 141  K 4.1 3.3* 4.0  CL 103 107 107  CO2 20* 24 24  GLUCOSE 112* 129* 141*  BUN 12 8 12   CREATININE 0.81 0.65 0.88  CALCIUM 8.8* 8.8* 8.2*   GFR: CrCl cannot be calculated (Unknown ideal weight.). Liver Function Tests: Recent Labs  Lab 11/22/18 2359  AST 29  ALT 10  ALKPHOS 42  BILITOT 1.2  PROT 5.6*  ALBUMIN 3.4*   No results for input(s): LIPASE, AMYLASE in the last 168 hours. No results for input(s): AMMONIA in the last 168 hours. Coagulation Profile: Recent Labs  Lab 11/23/18 0247  INR 1.01   Cardiac Enzymes: Recent Labs  Lab 11/22/18 2359  TROPONINI <0.03   BNP (last 3 results) No results for input(s): PROBNP in the last 8760 hours. HbA1C: No results for input(s): HGBA1C in the last 72 hours. CBG: No results for input(s): GLUCAP in the last 168 hours. Lipid Profile: No results for input(s): CHOL, HDL, LDLCALC, TRIG, CHOLHDL, LDLDIRECT in the last 72 hours. Thyroid Function Tests: No results for input(s): TSH, T4TOTAL, FREET4, T3FREE, THYROIDAB in the last 72 hours. Anemia Panel: No results for input(s): VITAMINB12, FOLATE, FERRITIN, TIBC, IRON, RETICCTPCT in the last 72 hours.    Radiology Studies: I have reviewed all of the imaging during this hospital visit personally     Scheduled Meds: . acetaminophen  500 mg Oral Q6H  . amLODipine  2.5 mg Oral Daily  . aspirin EC  325 mg Oral Q breakfast  . celecoxib  200 mg Oral BID  . docusate  sodium  100 mg Oral BID  . enoxaparin (LOVENOX) injection  40 mg Subcutaneous Q24H  . pantoprazole  40 mg Oral Daily  . potassium chloride  40 mEq Oral Once  . povidone-iodine  2 application Topical Once  . traMADol  50 mg Oral Q6H   Continuous Infusions: . 0.45 % NaCl with KCl 20 mEq / L 75 mL (11/23/18 2158)  . methocarbamol (ROBAXIN) IV       LOS: 1 day        Ashar Lewinski Annett Gulaaniel Gregorio Worley, MD

## 2018-11-24 NOTE — Care Management Note (Signed)
Case Management Note  Patient Details  Name: AMIEE SHIPES MRN: [id number] Date of Birth: [birth year]  Subjective/Objective: 85 yr old female, admitted after a fall at home with a left femur fracture. Patient underwent a left hip hemiarthroplasty.                 Action/Plan: Case manager spoke with patient's son, Dayton Scrape, with whom she lives. He also has a lady that assists him with care for his mom. Choice for Home Health Agency was offered, Mr. Vanaken said that he has used [facility] in the past with his mom and wants to do so now. He prefers keeping his mom at home because of her dementia. CM called referral for Home Health to Dara Hoyer, [facility].    Expected Discharge Date:    pending              Expected Discharge Plan:  Home w Home Health Services  In-House Referral:  NA  Discharge planning Services  CM Consult  Post Acute Care Choice:  Home Health Choice offered to:  Adult Children  DME Arranged:  N/A(has DME) DME Agency:  NA  HH Arranged:  PT HH Agency:  [facility]  Status of Service:  Completed, signed off  If discussed at [employer], dates discussed:    Additional Comments:  Durenda Guthrie, RN 11/24/2018, 11:42 AM

## 2018-11-25 DIAGNOSIS — W19XXXD Unspecified fall, subsequent encounter: Secondary | ICD-10-CM

## 2018-11-25 DIAGNOSIS — D649 Anemia, unspecified: Secondary | ICD-10-CM

## 2018-11-25 LAB — CBC
HCT: 24.5 % — ABNORMAL LOW (ref 36.0–46.0)
HEMOGLOBIN: 7.7 g/dL — AB (ref 12.0–15.0)
MCH: 27 pg (ref 26.0–34.0)
MCHC: 31.4 g/dL (ref 30.0–36.0)
MCV: 86 fL (ref 80.0–100.0)
Platelets: 99 10*3/uL — ABNORMAL LOW (ref 150–400)
RBC: 2.85 MIL/uL — ABNORMAL LOW (ref 3.87–5.11)
RDW: 14.7 % (ref 11.5–15.5)
WBC: 4.2 10*3/uL (ref 4.0–10.5)
nRBC: 0 % (ref 0.0–0.2)

## 2018-11-25 LAB — BASIC METABOLIC PANEL
Anion gap: 5 (ref 5–15)
BUN: 18 mg/dL (ref 8–23)
CO2: 25 mmol/L (ref 22–32)
Calcium: 8 mg/dL — ABNORMAL LOW (ref 8.9–10.3)
Chloride: 111 mmol/L (ref 98–111)
Creatinine, Ser: 1.02 mg/dL — ABNORMAL HIGH (ref 0.44–1.00)
GFR calc Af Amer: 58 mL/min — ABNORMAL LOW (ref >60)
GFR calc non Af Amer: 50 mL/min — ABNORMAL LOW (ref >60)
Glucose, Bld: 93 mg/dL (ref 70–99)
Potassium: 4.3 mmol/L (ref 3.5–5.1)
Sodium: 141 mmol/L (ref 135–145)

## 2018-11-25 MED ORDER — HYDROCODONE-ACETAMINOPHEN 5-325 MG PO TABS: 1.0000 | ORAL_TABLET | Freq: Four times a day (QID) | ORAL | 0 refills | Status: AC | PRN

## 2018-11-25 MED ORDER — ASPIRIN 325 MG PO TBEC: 325.0000 mg | DELAYED_RELEASE_TABLET | Freq: Every day | ORAL | 0 refills | Status: AC

## 2018-11-25 NOTE — Progress Notes (Signed)
   Subjective: 2 Days Post-Op Procedure(s) (LRB): ARTHROPLASTY BIPOLAR HIP (HEMIARTHROPLASTY) (Left) Patient reports pain as mild.    Objective: Vital signs in last 24 hours: Temp:  [98.2 F (36.8 C)-98.5 F (36.9 C)] 98.2 F (36.8 C) (02/04 0532) Pulse Rate:  [109-115] 115 (02/04 0532) Resp:  [14] 14 (02/04 0532) BP: (126-141)/(74-90) 126/90 (02/04 0532) SpO2:  [96 %-98 %] 98 % (02/04 0532)  Intake/Output from previous day: 02/03 0701 - 02/04 0700 In: 757.5 [P.O.:360; I.V.:397.5] Out: 600 [Urine:600] Intake/Output this shift: No intake/output data recorded.  Recent Labs    11/22/18 2359 11/23/18 0411 11/24/18 0428 11/25/18 0554  HGB 11.8* 11.3* 9.3* 7.7*   Recent Labs    11/24/18 0428 11/25/18 0554  WBC 4.9 4.2  RBC 3.40* 2.85*  HCT 28.8* 24.5*  PLT 128* 99*   Recent Labs    11/24/18 0428 11/25/18 0554  NA 141 141  K 4.0 4.3  CL 107 111  CO2 24 25  BUN 12 18  CREATININE 0.88 1.02*  GLUCOSE 141* 93  CALCIUM 8.2* 8.0*   Recent Labs    11/23/18 0247  INR 1.01    Neurovascular intact No results found.  Assessment/Plan: 2 Days Post-Op Procedure(s) (LRB): ARTHROPLASTY BIPOLAR HIP (HEMIARTHROPLASTY) (Left) Up with therapy OOB to recliner.  If Hgb drops further or symptomatic , transfusion.  Acute blood loss anemia due to hip fracture  Erica Vasquez 11/25/2018, 8:06 AM

## 2018-11-25 NOTE — Progress Notes (Signed)
Physical Therapy Treatment Patient Details Name: Erica Vasquez MRN: [id number] DOB: [birth year] Today's Date: 11/25/2018    History of Present Illness Pt is a 85 year old female suffered a left femoral neck fracture. PMH Alzheimer disease, Dementia, Blind, and cataract.     PT Comments    Pt is more alert today and able to follow limited one-step commands. Pt initiates movement but stops as soon as pain increases. Pt is total A for bed mobility and transfers. Pt able to sit EOB for 3 min progressing from maxA to steady to min guard. D/c plans remain appropriate at this time. PT will continue to follow acutely.     Follow Up Recommendations  Home health PT;Supervision/Assistance - 24 hour     Equipment Recommendations  None recommended by PT       Precautions / Restrictions Precautions Precautions: Posterior Hip Precaution Booklet Issued: Yes (comment) Precaution Comments: Pt in L KI, PT elected to keep on given pt dementia, hx of crossing legs, and anatomical tendency for hip IR  Required Braces or Orthoses: Knee Immobilizer - Left Knee Immobilizer - Left: Other (comment)(to be removed today) Restrictions Weight Bearing Restrictions: No LLE Weight Bearing: Weight bearing as tolerated    Mobility  Bed Mobility Overal bed mobility: Needs Assistance Bed Mobility: Supine to Sit     Supine to sit: Total assist;+2 for physical assistance;HOB elevated     General bed mobility comments: increased vc due to decreased visibility and dementia, use of hand over hand technique in guiding hands to EoB, pt requires total A to bring LE off bed and trunk to upright, pt with increased posterior lean, with increased time and support pt able to sit EoB unassisted  Transfers Overall transfer level: Needs assistance Equipment used: 2 person hand held assist Transfers: Stand Pivot Transfers   Stand pivot transfers: Total assist       General transfer comment: total assist to stand and  pivot on R LE, increased grimace with movement          Balance Overall balance assessment: History of Falls;Needs assistance Sitting-balance support: Feet supported;Bilateral upper extremity supported Sitting balance-Leahy Scale: Poor Sitting balance - Comments: pt initially requires mod A for balance able to progress to min guard assist Postural control: Posterior lean                                  Cognition Arousal/Alertness: Suspect due to medications Behavior During Therapy: Restless Overall Cognitive Status: History of cognitive impairments - at baseline                                 General Comments: hx of Alzheimers         General Comments General comments (skin integrity, edema, etc.): Son present during session, appreciative of pt getting out of bed       Pertinent Vitals/Pain Pain Assessment: Faces Faces Pain Scale: Hurts little more Pain Location: L hip Pain Descriptors / Indicators: Moaning;Nagging Pain Intervention(s): Limited activity within patient's tolerance;Monitored during session;Repositioned           PT Goals (current goals can now be found in the care plan section) Acute Rehab PT Goals Patient Stated Goal: Caregiver states wanting the best care for her. PT Goal Formulation: Patient unable to participate in goal setting Time For Goal Achievement: 12/08/18 Potential to Achieve Goals:  Fair Progress towards PT goals: Progressing toward goals    Frequency    Min 5X/week      PT Plan Current plan remains appropriate       AM-PAC PT "6 Clicks" Mobility   Outcome Measure  Help needed turning from your back to your side while in a flat bed without using bedrails?: Total Help needed moving from lying on your back to sitting on the side of a flat bed without using bedrails?: Total Help needed moving to and from a bed to a chair (including a wheelchair)?: Total Help needed standing up from a chair using  your arms (e.g., wheelchair or bedside chair)?: Total Help needed to walk in hospital room?: Total Help needed climbing 3-5 steps with a railing? : Total 6 Click Score: 6    End of Session Equipment Utilized During Treatment: Left knee immobilizer;Gait belt Activity Tolerance: Patient limited by lethargy Patient left: in chair;with call bell/phone within reach;with chair alarm set;with family/visitor present Nurse Communication: Mobility status;Precautions;Weight bearing status PT Visit Diagnosis: Unsteadiness on feet (R26.81);Other abnormalities of gait and mobility (R26.89);Muscle weakness (generalized) (M62.81);History of falling (Z91.81);Difficulty in walking, not elsewhere classified (R26.2);Pain;Other symptoms and signs involving the nervous system (R29.898) Pain - Right/Left: Left Pain - part of body: Hip     Time: 0045-9977 PT Time Calculation (min) (ACUTE ONLY): 22 min  Charges:  $Therapeutic Activity: 8-22 mins                     Natalio Salois B. Beverely Risen PT, DPT Acute Rehabilitation Services Pager [phone] Office [phone]    Erica Vasquez 11/25/2018, 3:52 PM

## 2018-11-25 NOTE — Plan of Care (Signed)
  Problem: Pain Managment: Goal: General experience of comfort will improve Outcome: Progressing   Problem: Safety: Goal: Ability to remain free from injury will improve Outcome: Progressing   

## 2018-11-25 NOTE — Discharge Instructions (Signed)
INSTRUCTIONS AFTER LEFT HIP HEMIARTHROPLASTY  o Remove items at home which could result in a fall. This includes throw rugs or furniture in walking pathways o ICE to the affected joint every three hours while awake for 30 minutes at a time, for at least the first 3-5 days, and then as needed for pain and swelling.  Continue to use ice for pain and swelling. You may notice swelling that will progress down to the foot and ankle.  This is normal after surgery.  Elevate your leg when you are not up walking on it.   o Continue to use the breathing machine you got in the hospital (incentive spirometer) which will help keep your temperature down.  It is common for your temperature to cycle up and down following surgery, especially at night when you are not up moving around and exerting yourself.  The breathing machine keeps your lungs expanded and your temperature down.   DIET:  As you were doing prior to hospitalization, we recommend a well-balanced diet.  DRESSING / WOUND CARE / SHOWERING  -ok to shower but no tub soaking.  Do not apply any creams or ointments to incision.  RN to perform daily wound checks and dressing changes with gauze and tape.   ACTIVITY  o Increase activity slowly as tolerated, but follow the weight bearing instructions below.   o No driving for 6 weeks or until further direction given by your physician.  You cannot drive while taking narcotics.  o No lifting or carrying greater than 10 lbs. until further directed by your surgeon. o Avoid periods of inactivity such as sitting longer than an hour when not asleep. This helps prevent blood clots.  o You may return to work once you are authorized by your doctor.     WEIGHT BEARING   Weight bearing as tolerated with assist device (walker, cane, etc) as directed, use it as long as suggested by your surgeon or therapist, typically at least 4-6 weeks.   EXERCISES  Results after joint replacement surgery are often greatly  improved when you follow the exercise, range of motion and muscle strengthening exercises prescribed by your doctor. Safety measures are also important to protect the joint from further injury. Any time any of these exercises cause you to have increased pain or swelling, decrease what you are doing until you are comfortable again and then slowly increase them. If you have problems or questions, call your caregiver or physical therapist for advice.   Rehabilitation is important following a joint replacement. After just a few days of immobilization, the muscles of the leg can become weakened and shrink (atrophy).  These exercises are designed to build up the tone and strength of the thigh and leg muscles and to improve motion. Often times heat used for twenty to thirty minutes before working out will loosen up your tissues and help with improving the range of motion but do not use heat for the first two weeks following surgery (sometimes heat can increase post-operative swelling).   These exercises can be done on a training (exercise) mat, on the floor, on a table or on a bed. Use whatever works the best and is most comfortable for you.    Use music or television while you are exercising so that the exercises are a pleasant break in your day. This will make your life better with the exercises acting as a break in your routine that you can look forward to.   Perform all exercises  about fifteen times, three times per day or as directed.  You should exercise both the operative leg and the other leg as well.  Exercises include:    Quad Sets - Tighten up the muscle on the front of the thigh (Quad) and hold for 5-10 seconds.    Straight Leg Raises - With your knee straight (if you were given a brace, keep it on), lift the leg to 60 degrees, hold for 3 seconds, and slowly lower the leg.  Perform this exercise against resistance later as your leg gets stronger.   Leg Slides: Lying on your back, slowly slide your  foot toward your buttocks, bending your knee up off the floor (only go as far as is comfortable). Then slowly slide your foot back down until your leg is flat on the floor again.   Angel Wings: Lying on your back spread your legs to the side as far apart as you can without causing discomfort.   Hamstring Strength:  Lying on your back, push your heel against the floor with your leg straight by tightening up the muscles of your buttocks.  Repeat, but this time bend your knee to a comfortable angle, and push your heel against the floor.  You may put a pillow under the heel to make it more comfortable if necessary.   A rehabilitation program following joint replacement surgery can speed recovery and prevent re-injury in the future due to weakened muscles. Contact your doctor or a physical therapist for more information on knee rehabilitation.    CONSTIPATION  Constipation is defined medically as fewer than three stools per week and severe constipation as less than one stool per week.  Even if you have a regular bowel pattern at home, your normal regimen is likely to be disrupted due to multiple reasons following surgery.  Combination of anesthesia, postoperative narcotics, change in appetite and fluid intake all can affect your bowels.   YOU MUST use at least one of the following options; they are listed in order of increasing strength to get the job done.  They are all available over the counter, and you may need to use some, POSSIBLY even all of these options:    Drink plenty of fluids (prune juice may be helpful) and high fiber foods Colace 100 mg by mouth twice a day  Senokot for constipation as directed and as needed Dulcolax (bisacodyl), take with full glass of water  Miralax (polyethylene glycol) once or twice a day as needed.  If you have tried all these things and are unable to have a bowel movement in the first 3-4 days after surgery call either your surgeon or your primary doctor.    If  you experience loose stools or diarrhea, hold the medications until you stool forms back up.  If your symptoms do not get better within 1 week or if they get worse, check with your doctor.  If you experience "the worst abdominal pain ever" or develop nausea or vomiting, please contact the office immediately for further recommendations for treatment.   ITCHING:  If you experience itching with your medications, try taking only a single pain pill, or even half a pain pill at a time.  You can also use Benadryl over the counter for itching or also to help with sleep.   TED HOSE STOCKINGS:  Use stockings on both legs until for at least 2 weeks or as directed by physician office. They may be removed at night for sleeping.  MEDICATIONS:  See your medication summary on the After Visit Summary that nursing will review with you.  You may have some home medications which will be placed on hold until you complete the course of blood thinner medication.  It is important for you to complete the blood thinner medication as prescribed.  PRECAUTIONS:  If you experience chest pain or shortness of breath - call 911 immediately for transfer to the hospital emergency department.   If you develop a fever greater that 101 F, purulent drainage from wound, increased redness or drainage from wound, foul odor from the wound/dressing, or calf pain - CONTACT YOUR SURGEON.                                                   FOLLOW-UP APPOINTMENTS:  If you do not already have a post-op appointment, please call the office for an appointment to be seen by your surgeon.  Guidelines for how soon to be seen are listed in your After Visit Summary, but are typically between 1-4 weeks after surgery.  OTHER INSTRUCTIONS:   Knee Replacement:  Do not place pillow under knee, focus on keeping the knee straight while resting. CPM instructions: 0-90 degrees, 2 hours in the morning, 2 hours in the afternoon, and 2 hours in the evening. Place  foam block, curve side up under heel at all times except when in CPM or when walking.  DO NOT modify, tear, cut, or change the foam block in any way.  MAKE SURE YOU:   Understand these instructions.   Get help right away if you are not doing well or get worse.    Thank you for letting us be a part of your medical care team.  It is a privilege we respect greatly.  We hope these instructions will help you stay on track for a fast and full recovery!

## 2018-11-25 NOTE — Progress Notes (Signed)
PROGRESS NOTE    Seward CarolMontez P Codd  ZOX:[id number] DOB: [birth year] DOA: 11/22/2018 PCP: Mirna MiresHill, [name], MD    Brief Narrative:  85 year old female who presented after mechanical fall. She does have significant dementia along with history of hypertension and ambulatory dysfunction, wheelchair-bound. Apparently while trying to get out of her wheelchair she sustained a mechanical fall,injuring her left hip. History was limited due to patient's dementia. On her initial physical examination blood pressure 145/64, heart rate 95, respiratory rate 14, oxygen saturation 97%,she had moist mucous membranes, her lungs were clear to auscultation bilaterally, heart S1-S2 present rhythmic abdomen was soft nontender, no lower extremity edema. Her left femur was angulated. Sodium 141, potassium 3.3, chloride 107, bicarb 24, glucose 129, BUN 8, creatinine 0.65, white count 5.6, hemoglobin 9.3, hematocrit 36.0, platelets 155. Urine analysis negative for infection. Head and cervical CT no acute changes.Chest x-ray with bibasilar atelectasis. Hip films with acute subcapital fracture of the left proximal femur with varus angulation. Previous right hip arthroplasty, hardware intact.EKG with normal sinus rhythm, left axis deviation, left anterior fascicular block, poor R wave progression.  Patient was admitted to the hospital working diagnosis of left proximal femur fracture.   Assessment & Plan:   Principal Problem:   Hip fracture [facility](HCC) Active Problems:   Fall   Essential hypertension, benign   Alzheimer disease (HCC)  1. Left proximal femur fracture sp arthroplasty . Will continue pain control with morphine, hydrocodone, celecoxib, and acetaminophen. Patient evaluated by physical therapy, plan for dc home with home health and home pt.   2. HTN. Continue  amlodipine for blood pressure control.    3. Dementia. has remained somnolent when I visit her, not much interactive, per her family has been  less talkative today. Very weak   4. Hypokalemia. Renal function stable with serum K at 4,3, will follow on renal panel in am, avoid hypotension and nephrotoxic medications.  5. Right breast mass.  Has been evaluated by her primary care, and decision was made for conservative management and not to proceed with invasive testing. No apparent local pain or skin changes.   6. Post op anemia. Acute blood loss. Likely patient with a baseline chronic anemia, will check Iron panel. Hold on prbc transfusion for now, unless drop hb below 7. Will check cell count in am.    DVT prophylaxis:enoxaparin Code Status:full Family Communication:I spoke with patient'ssonat the bedside and all questions were addressed.  There is no height or weight on file to calculate BMI. Malnutrition Type:      Malnutrition Characteristics:      Nutrition Interventions:     RN Pressure Injury Documentation:     Consultants:   orthopedics  Procedures:     Antimicrobials:       Subjective: Patient is somnolent at the time of my examination, per her family at the bedside, she has been less interactive today. Pain apparently has bee well controlled with analgesics.    Objective: Vitals:   11/23/18 2040 11/24/18 0555 11/24/18 2043 11/25/18 0532  BP: (!) 155/84 125/69 (!) 141/74 126/90  Pulse: (!) 103 90 (!) 109 (!) 115  Resp:   14 14  Temp: 97.7 F (36.5 C) 98.4 F (36.9 C) 98.5 F (36.9 C) 98.2 F (36.8 C)  TempSrc: Oral Oral Oral Oral  SpO2: 100% 100% 96% 98%    Intake/Output Summary (Last 24 hours) at 11/25/2018 1321 Last data filed at 11/25/2018 0900 Gross per 24 hour  Intake 777.5 ml  Output 900 ml  Net -122.5 ml   There were no vitals filed for this visit.  Examination:   General: deconditioned  Neurology: somnolent and not interactive.   E ENT: mild pallor, no icterus, oral mucosa moist Cardiovascular: No JVD. S1-S2 present, rhythmic, no gallops, rubs, or murmurs. No  lower extremity edema. Pulmonary: positive breath sounds bilaterally, adequate air movement, no wheezing, rhonchi or rales. Gastrointestinal. Abdomen with no organomegaly, non tender, no rebound or guarding Skin. No rashes Musculoskeletal: no joint deformities She is known to have a right breat mass.     Data Reviewed: I have personally reviewed following labs and imaging studies  CBC: Recent Labs  Lab 11/22/18 2359 11/23/18 0411 11/24/18 0428 11/25/18 0554  WBC 6.4 5.6 4.9 4.2  NEUTROABS 4.9  --   --   --   HGB 11.8* 11.3* 9.3* 7.7*  HCT 38.3 36.0 28.8* 24.5*  MCV 86.8 86.1 84.7 86.0  PLT 190 155 128* 99*   Basic Metabolic Panel: Recent Labs  Lab 11/22/18 2359 11/23/18 0411 11/24/18 0428 11/25/18 0554  NA 140 141 141 141  K 4.1 3.3* 4.0 4.3  CL 103 107 107 111  CO2 20* 24 24 25   GLUCOSE 112* 129* 141* 93  BUN 12 8 12 18   CREATININE 0.81 0.65 0.88 1.02*  CALCIUM 8.8* 8.8* 8.2* 8.0*   GFR: CrCl cannot be calculated (Unknown ideal weight.). Liver Function Tests: Recent Labs  Lab 11/22/18 2359  AST 29  ALT 10  ALKPHOS 42  BILITOT 1.2  PROT 5.6*  ALBUMIN 3.4*   No results for input(s): LIPASE, AMYLASE in the last 168 hours. No results for input(s): AMMONIA in the last 168 hours. Coagulation Profile: Recent Labs  Lab 11/23/18 0247  INR 1.01   Cardiac Enzymes: Recent Labs  Lab 11/22/18 2359  TROPONINI <0.03   BNP (last 3 results) No results for input(s): PROBNP in the last 8760 hours. HbA1C: No results for input(s): HGBA1C in the last 72 hours. CBG: No results for input(s): GLUCAP in the last 168 hours. Lipid Profile: No results for input(s): CHOL, HDL, LDLCALC, TRIG, CHOLHDL, LDLDIRECT in the last 72 hours. Thyroid Function Tests: No results for input(s): TSH, T4TOTAL, FREET4, T3FREE, THYROIDAB in the last 72 hours. Anemia Panel: No results for input(s): VITAMINB12, FOLATE, FERRITIN, TIBC, IRON, RETICCTPCT in the last 72  hours.    Radiology Studies: I have reviewed all of the imaging during this hospital visit personally     Scheduled Meds: . amLODipine  2.5 mg Oral Daily  . aspirin EC  325 mg Oral Q breakfast  . celecoxib  200 mg Oral BID  . docusate sodium  100 mg Oral BID  . enoxaparin (LOVENOX) injection  40 mg Subcutaneous Q24H  . pantoprazole  40 mg Oral Daily  . potassium chloride  40 mEq Oral Once  . povidone-iodine  2 application Topical Once  . traMADol  50 mg Oral Q6H   Continuous Infusions: . methocarbamol (ROBAXIN) IV       LOS: 2 days        Alandis Bluemel Annett Gulaaniel Ximena Todaro, MD

## 2018-11-26 LAB — CBC
HEMATOCRIT: 21.7 % — AB (ref 36.0–46.0)
Hemoglobin: 6.6 g/dL — CL (ref 12.0–15.0)
MCH: 26.7 pg (ref 26.0–34.0)
MCHC: 30.4 g/dL (ref 30.0–36.0)
MCV: 87.9 fL (ref 80.0–100.0)
Platelets: 90 10*3/uL — ABNORMAL LOW (ref 150–400)
RBC: 2.47 MIL/uL — ABNORMAL LOW (ref 3.87–5.11)
RDW: 14.6 % (ref 11.5–15.5)
WBC: 3.3 10*3/uL — AB (ref 4.0–10.5)
nRBC: 0 % (ref 0.0–0.2)

## 2018-11-26 LAB — FERRITIN: Ferritin: 83 ng/mL (ref 11–307)

## 2018-11-26 LAB — IRON AND TIBC
Iron: 13 ug/dL — ABNORMAL LOW (ref 28–170)
SATURATION RATIOS: 8 % — AB (ref 10.4–31.8)
TIBC: 154 ug/dL — AB (ref 250–450)
UIBC: 141 ug/dL

## 2018-11-26 LAB — TRANSFERRIN: Transferrin: 110 mg/dL — ABNORMAL LOW (ref 192–382)

## 2018-11-26 LAB — PREPARE RBC (CROSSMATCH)

## 2018-11-26 MED ORDER — SODIUM CHLORIDE 0.9% IV SOLUTION
Freq: Once | INTRAVENOUS | Status: AC
  Administered 2018-11-26: 10:00:00 via INTRAVENOUS

## 2018-11-26 NOTE — Progress Notes (Signed)
Occupational Therapy Treatment Patient Details Name: Erica Vasquez MRN: [id number] DOB: Nov 08, 1929 Today's Date: 11/26/2018    History of present illness Pt is a 85 year old female suffered a left femoral neck fracture. PMH Alzheimer disease, Dementia, Blind, and cataract.    OT comments  Pt with limited interaction with therapists today. She did enjoy listening to motown music during session, and while sitting EOB was humming along and tapping her toe to the rhythms. Pt remains max to total for all aspects of ADL/transfers (+2 for transfers) son not present for the session today. Requested big single button call bell for Pt. Also I do think that it would be worth a palliative consult for Pt. If son is comfortable taking her home at her current level, support HHOT - otherwise Pt will need SNF level care. OT will continue to follow acutely.    Follow Up Recommendations  Home health OT;Supervision/Assistance - 24 hour;SNF(HH as long as son is comfortable taking care of her)    Equipment Recommendations  None recommended by OT    Recommendations for Other Services Other (comment)(Palliative Consult - for code status)    Precautions / Restrictions Precautions Precautions: Posterior Hip Precaution Comments: Pt in L KI, PTA elected to keep on given pt dementia, hx of crossing legs, and anatomical tendency for hip IR  Required Braces or Orthoses: Knee Immobilizer - Left Knee Immobilizer - Left: Other (comment)(KI for posterior hip precautions) Restrictions Weight Bearing Restrictions: No LLE Weight Bearing: Weight bearing as tolerated       Mobility Bed Mobility Overal bed mobility: Needs Assistance Bed Mobility: Supine to Sit     Supine to sit: Total assist;+2 for physical assistance;HOB elevated     General bed mobility comments: use of bed pad to scoot hips to EOB; assist for all aspects of bed mobility  Transfers Overall transfer level: Needs assistance Equipment used: (+2  face to face with gait belt and bed pad) Transfers: Stand Pivot Transfers   Stand pivot transfers: Total assist       General transfer comment: holding onto therapists; total A for transfer    Balance Overall balance assessment: History of Falls;Needs assistance Sitting-balance support: Feet supported;Bilateral upper extremity supported Sitting balance-Leahy Scale: Poor   Postural control: Posterior lean   Standing balance-Leahy Scale: Zero                             ADL either performed or assessed with clinical judgement   ADL Overall ADL's : Needs assistance/impaired                         Toilet Transfer: Total assistance;+2 for physical assistance;+2 for safety/equipment           Functional mobility during ADLs: Total assistance General ADL Comments: Pt is Max A to total in all ADLs due to weakness, pain, and visual deficits.      Vision       Perception     Praxis      Cognition Arousal/Alertness: Awake/alert Behavior During Therapy: WFL for tasks assessed/performed Overall Cognitive Status: History of cognitive impairments - at baseline                                 General Comments: hx of Alzheimers and dementia(pt keeps eyes closed and verbalized minimally)  Exercises     Shoulder Instructions       General Comments pt really enjoyed listening to motown music during session and tapping feet to music    Pertinent Vitals/ Pain       Pain Assessment: Faces Faces Pain Scale: Hurts little more Pain Location: L hip Pain Descriptors / Indicators: Guarding Pain Intervention(s): Limited activity within patient's tolerance;Monitored during session;Repositioned  Home Living                                          Prior Functioning/Environment              Frequency  Min 2X/week        Progress Toward Goals  OT Goals(current goals can now be found in the care plan  section)  Progress towards OT goals: Not progressing toward goals - comment(limited interaction with therapists)  Acute Rehab OT Goals Patient Stated Goal: none stated this session OT Goal Formulation: Patient unable to participate in goal setting Time For Goal Achievement: 12/08/18 Potential to Achieve Goals: Fair  Plan Discharge plan remains appropriate;Frequency remains appropriate    Co-evaluation    PT/OT/SLP Co-Evaluation/Treatment: Yes Reason for Co-Treatment: Complexity of the patient's impairments (multi-system involvement);For patient/therapist safety;To address functional/ADL transfers;Necessary to address cognition/behavior during functional activity PT goals addressed during session: Mobility/safety with mobility OT goals addressed during session: ADL's and self-care      AM-PAC OT "6 Clicks" Daily Activity     Outcome Measure   Help from another person eating meals?: A Lot Help from another person taking care of personal grooming?: A Lot Help from another person toileting, which includes using toliet, bedpan, or urinal?: Total Help from another person bathing (including washing, rinsing, drying)?: Total Help from another person to put on and taking off regular upper body clothing?: Total Help from another person to put on and taking off regular lower body clothing?: Total 6 Click Score: 8    End of Session Equipment Utilized During Treatment: Left knee immobilizer  OT Visit Diagnosis: Repeated falls (R29.6);Other abnormalities of gait and mobility (R26.89)   Activity Tolerance Patient tolerated treatment well   Patient Left in chair;with call bell/phone within reach;with family/visitor present   Nurse Communication Mobility status;Precautions(needs purewick replaced)        Time: 1093-2355 OT Time Calculation (min): 33 min  Charges: OT General Charges $OT Visit: 1 Visit OT Treatments $Self Care/Home Management : 8-22 mins  Sherryl Manges OTR/L Acute  Rehabilitation Services Pager: [phone] Office: [phone]   Erica Vasquez Erica Vasquez 11/26/2018, 6:36 PM

## 2018-11-26 NOTE — Progress Notes (Signed)
Physical Therapy Treatment Patient Details Name: Erica Vasquez MRN: [id number] DOB: 1930/04/21 Today's Date: 11/26/2018    History of Present Illness Pt is a 85 year old female suffered a left femoral neck fracture. PMH Alzheimer disease, Dementia, Blind, and cataract.     PT Comments    Patient seen for mobility progression. Pt requires total A +2 for mobility. Pt tolerated OOB transfer to recliner well. Pt seemed to really enjoy listening to music and seemed less anxious with mobility with music playing during session. Continue to progress as tolerated.    Follow Up Recommendations  Home health PT;Supervision/Assistance - 24 hour     Equipment Recommendations  None recommended by PT    Recommendations for Other Services       Precautions / Restrictions Precautions Precautions: Posterior Hip Precaution Comments: Pt in L KI, PTA elected to keep on given pt dementia, hx of crossing legs, and anatomical tendency for hip IR  Required Braces or Orthoses: Knee Immobilizer - Left Restrictions Weight Bearing Restrictions: No LLE Weight Bearing: Weight bearing as tolerated    Mobility  Bed Mobility Overal bed mobility: Needs Assistance Bed Mobility: Supine to Sit     Supine to sit: Total assist;+2 for physical assistance;HOB elevated     General bed mobility comments: use of bed pad to scoot hips to EOB; assist for all aspects of bed mobility  Transfers Overall transfer level: Needs assistance Equipment used: (+2 face to face with gait belt and bed pad) Transfers: Stand Pivot Transfers   Stand pivot transfers: Total assist       General transfer comment: holding onto therapists; total A for transfer  Ambulation/Gait                 Stairs             Wheelchair Mobility    Modified Rankin (Stroke Patients Only)       Balance Overall balance assessment: History of Falls;Needs assistance Sitting-balance support: Feet supported;Bilateral upper  extremity supported Sitting balance-Leahy Scale: Poor   Postural control: Posterior lean   Standing balance-Leahy Scale: Zero                              Cognition Arousal/Alertness: Awake/alert Behavior During Therapy: WFL for tasks assessed/performed Overall Cognitive Status: History of cognitive impairments - at baseline                                 General Comments: hx of Alzheimers and dementia(pt keeps eyes closed and verbalized minimally)      Exercises      General Comments General comments (skin integrity, edema, etc.): pt really enjoyed listening to motown music during session and tapping feet to music      Pertinent Vitals/Pain Pain Assessment: Faces Faces Pain Scale: Hurts little more Pain Location: L hip Pain Descriptors / Indicators: Guarding Pain Intervention(s): Limited activity within patient's tolerance;Monitored during session;Repositioned    Home Living                      Prior Function            PT Goals (current goals can now be found in the care plan section) Progress towards PT goals: Progressing toward goals    Frequency    Min 5X/week      PT Plan Current plan remains  appropriate    Co-evaluation              AM-PAC PT "6 Clicks" Mobility   Outcome Measure  Help needed turning from your back to your side while in a flat bed without using bedrails?: Total Help needed moving from lying on your back to sitting on the side of a flat bed without using bedrails?: Total Help needed moving to and from a bed to a chair (including a wheelchair)?: Total Help needed standing up from a chair using your arms (e.g., wheelchair or bedside chair)?: Total Help needed to walk in hospital room?: Total Help needed climbing 3-5 steps with a railing? : Total 6 Click Score: 6    End of Session Equipment Utilized During Treatment: Left knee immobilizer;Gait belt Activity Tolerance: Patient tolerated  treatment well Patient left: in chair;with call bell/phone within reach;with chair alarm set Nurse Communication: Mobility status;Precautions;Weight bearing status PT Visit Diagnosis: Unsteadiness on feet (R26.81);Other abnormalities of gait and mobility (R26.89);Muscle weakness (generalized) (M62.81);History of falling (Z91.81);Difficulty in walking, not elsewhere classified (R26.2);Pain;Other symptoms and signs involving the nervous system (R29.898) Pain - Right/Left: Left Pain - part of body: Hip     Time: 6834-1962 PT Time Calculation (min) (ACUTE ONLY): 32 min  Charges:  $Gait Training: 8-22 mins                     Erline Levine, PTA Acute Rehabilitation Services Pager: [phone] Office: [phone]     Carolynne Edouard 11/26/2018, 5:00 PM

## 2018-11-26 NOTE — Progress Notes (Signed)
[facility] PROGRESS NOTE  Erica Vasquez XYB:[id number] DOB: 06/09/1930 DOA: 11/22/2018 PCP: Mirna Mires, MD  Assessment/Plan: 1. Left hip fracture, status post left hip arthroplasty.  Evaluated by PT who recommends home with home health therapy which family is agreeable to.  Continue PRN Norco every 4 hours moderate pain, Robaxin every 6 hours muscle spasms, Tylenol every 6 hours mild pain  2. Acute blood loss anemia.  Likely postoperative.  Will transfuse 2 units, follow-up repeat posttransfusion CBC.  Remains asymptomatic  3. Right breast mass.  Discussed with family who elected not pursue further evaluation given significant core abilities/poor prognosis he was evaluated by her primary care doctor as well)  4. Advanced Alzheimer's dementia.  Not very interactive on exam, speaks words but not sensical to conversation( which is her baseline per her son).  Delirium precautions   5. Hypertension, stable.  Continue home amlodipine.  Code Status: Full code Family Communication: Spoke with her Son Randall Mega at [phone] on 11/26/18  Disposition Plan: Need to ensure hemoglobin remained stable after transfusion next 24 hours before potential discharge home with home health therapy  Consultants:  Orthopedics  Procedures:  Left hip hemiarthroplasty, 11/23/2018  Antibiotics:  None  HPI/Subjective:  Erica Vasquez is a 85 y.o. year old female with medical history significant for of dementia, hypertension who presented on 11/22/2018 with injury to her left hip after trying to get out of her wheelchair and falling and was found to have left hip fracture  No acute events overnight.  Objective: Vitals:   11/26/18 1109 11/26/18 1427  BP: 132/64 (!) 154/78  Pulse:  79  Resp:  15  Temp:  98.7 F (37.1 C)  SpO2:  100%    Intake/Output Summary (Last 24 hours) at 11/26/2018 1622 Last data filed at 11/26/2018 1428 Gross per 24 hour  Intake 510 ml  Output 1050 ml  Net -540  ml   There were no vitals filed for this visit.  Exam:   General: Lying in bed, no acute distress  Cardiovascular: Regular rate and rhythm, no appreciable murmurs rubs or gallops, no peripheral edema  Respiratory: Normal respiratory effort on room air  Skin dense hard nodule in outer quadrant of right breast  Neurologic speaks to voice but nonsensical, and not able to answer any questions of orientation (stable at baseline) gripping sheet in hand  Data Reviewed: Basic Metabolic Panel: Recent Labs  Lab 11/22/18 2359 11/23/18 0411 11/24/18 0428 11/25/18 0554  NA 140 141 141 141  K 4.1 3.3* 4.0 4.3  CL 103 107 107 111  CO2 20* 24 24 25   GLUCOSE 112* 129* 141* 93  BUN 12 8 12 18   CREATININE 0.81 0.65 0.88 1.02*  CALCIUM 8.8* 8.8* 8.2* 8.0*   Liver Function Tests: Recent Labs  Lab 11/22/18 2359  AST 29  ALT 10  ALKPHOS 42  BILITOT 1.2  PROT 5.6*  ALBUMIN 3.4*   No results for input(s): LIPASE, AMYLASE in the last 168 hours. No results for input(s): AMMONIA in the last 168 hours. CBC: Recent Labs  Lab 11/22/18 2359 11/23/18 0411 11/24/18 0428 11/25/18 0554 11/26/18 0640  WBC 6.4 5.6 4.9 4.2 3.3*  NEUTROABS 4.9  --   --   --   --   HGB 11.8* 11.3* 9.3* 7.7* 6.6*  HCT 38.3 36.0 28.8* 24.5* 21.7*  MCV 86.8 86.1 84.7 86.0 87.9  PLT 190 155 128* 99* 90*   Cardiac Enzymes: Recent Labs  Lab 11/22/18 2359  TROPONINI <0.03   BNP (last 3 results) No results for input(s): BNP in the last 8760 hours.  ProBNP (last 3 results) No results for input(s): PROBNP in the last 8760 hours.  CBG: No results for input(s): GLUCAP in the last 168 hours.  Recent Results (from the past 240 hour(s))  Surgical PCR screen     Status: None   Collection Time: 11/23/18  1:11 PM  Result Value Ref Range Status   MRSA, PCR NEGATIVE NEGATIVE Final   Staphylococcus aureus NEGATIVE NEGATIVE Final    Comment: (NOTE) The Xpert SA Assay (FDA approved for NASAL specimens in patients  62 years of age and older), is one component of a comprehensive surveillance program. It is not intended to diagnose infection nor to guide or monitor treatment. Performed at [facility], [address], [place], [place] [address]      Studies: No results found.  Scheduled Meds: . amLODipine  2.5 mg Oral Daily  . aspirin EC  325 mg Oral Q breakfast  . celecoxib  200 mg Oral BID  . docusate sodium  100 mg Oral BID  . enoxaparin (LOVENOX) injection  40 mg Subcutaneous Q24H  . pantoprazole  40 mg Oral Daily  . potassium chloride  40 mEq Oral Once  . povidone-iodine  2 application Topical Once  . traMADol  50 mg Oral Q6H   Continuous Infusions: . methocarbamol (ROBAXIN) IV      Principal Problem:   Hip fracture [facility]) Active Problems:   Fall   Essential hypertension, benign   Alzheimer disease (HCC)      Erica Vasquez  [facility]

## 2018-11-26 NOTE — Progress Notes (Signed)
   Subjective: 3 Days Post-Op Procedure(s) (LRB): ARTHROPLASTY BIPOLAR HIP (HEMIARTHROPLASTY) (Left) Patient reports pain as mild.    Objective: Vital signs in last 24 hours: Temp:  [98.2 F (36.8 C)-98.6 F (37 C)] 98.6 F (37 C) (02/05 0504) Pulse Rate:  [94-111] 94 (02/05 0504) Resp:  [16-19] 19 (02/04 2033) BP: (128-139)/(67-75) 128/71 (02/05 0504) SpO2:  [94 %-96 %] 96 % (02/05 0504)  Intake/Output from previous day: 02/04 0701 - 02/05 0700 In: 500 [P.O.:500] Out: 950 [Urine:950] Intake/Output this shift: No intake/output data recorded.  Recent Labs    11/24/18 0428 11/25/18 0554 11/26/18 0640  HGB 9.3* 7.7* 6.6*   Recent Labs    11/25/18 0554 11/26/18 0640  WBC 4.2 3.3*  RBC 2.85* 2.47*  HCT 24.5* 21.7*  PLT 99* 90*   Recent Labs    11/24/18 0428 11/25/18 0554  NA 141 141  K 4.0 4.3  CL 107 111  CO2 24 25  BUN 12 18  CREATININE 0.88 1.02*  GLUCOSE 141* 93  CALCIUM 8.2* 8.0*   No results for input(s): LABPT, INR in the last 72 hours.  Neurologically intact No results found.  Assessment/Plan: 3 Days Post-Op Procedure(s) (LRB): ARTHROPLASTY BIPOLAR HIP (HEMIARTHROPLASTY) (Left) Up with therapy Acute blood loss anemia. Recommend transfusion since she is still dropping.  Eldred Manges 11/26/2018, 8:17 AM

## 2018-11-27 DIAGNOSIS — Z9889 Other specified postprocedural states: Secondary | ICD-10-CM

## 2018-11-27 DIAGNOSIS — D62 Acute posthemorrhagic anemia: Secondary | ICD-10-CM

## 2018-11-27 DIAGNOSIS — H543 Unqualified visual loss, both eyes: Secondary | ICD-10-CM

## 2018-11-27 LAB — TYPE AND SCREEN
ABO/RH(D): O POS
Antibody Screen: NEGATIVE
Unit division: 0
Unit division: 0

## 2018-11-27 LAB — CBC
HCT: 32.8 % — ABNORMAL LOW (ref 36.0–46.0)
HEMOGLOBIN: 10.3 g/dL — AB (ref 12.0–15.0)
MCH: 27.1 pg (ref 26.0–34.0)
MCHC: 31.4 g/dL (ref 30.0–36.0)
MCV: 86.3 fL (ref 80.0–100.0)
Platelets: 132 10*3/uL — ABNORMAL LOW (ref 150–400)
RBC: 3.8 MIL/uL — ABNORMAL LOW (ref 3.87–5.11)
RDW: 14.3 % (ref 11.5–15.5)
WBC: 3.7 10*3/uL — ABNORMAL LOW (ref 4.0–10.5)
nRBC: 0 % (ref 0.0–0.2)

## 2018-11-27 LAB — BPAM RBC
BLOOD PRODUCT EXPIRATION DATE: 202002112359
Blood Product Expiration Date: 202003062359
ISSUE DATE / TIME: 202002050934
ISSUE DATE / TIME: 202002060725
Unit Type and Rh: 5100
Unit Type and Rh: 5100

## 2018-11-27 LAB — PREPARE RBC (CROSSMATCH)

## 2018-11-27 MED ORDER — SODIUM CHLORIDE 0.9% IV SOLUTION
Freq: Once | INTRAVENOUS | Status: AC
  Administered 2018-11-27: 05:00:00 via INTRAVENOUS

## 2018-11-27 NOTE — Anesthesia Postprocedure Evaluation (Signed)
Anesthesia Post Note  Patient: Erica Vasquez  Procedure(s) Performed: ARTHROPLASTY BIPOLAR HIP (HEMIARTHROPLASTY) (Left )     Patient location during evaluation: PACU Anesthesia Type: General Level of consciousness: awake and alert Pain management: pain level controlled Vital Signs Assessment: post-procedure vital signs reviewed and stable Respiratory status: spontaneous breathing, nonlabored ventilation, respiratory function stable and patient connected to nasal cannula oxygen Cardiovascular status: blood pressure returned to baseline and stable Postop Assessment: no apparent nausea or vomiting Anesthetic complications: no    Last Vitals:  Vitals:   11/27/18 0500 11/27/18 1058  BP: (!) 148/61 (!) 148/61  Pulse: 66   Resp: 15   Temp: 36.7 C   SpO2: 94%     Last Pain:  Vitals:   11/27/18 1230  TempSrc:   PainSc: Asleep   Pain Goal:    LLE Motor Response: Purposeful movement, Responds to commands (11/27/18 1343) LLE Sensation: Pain, Full sensation (11/27/18 1343) RLE Motor Response: Purposeful movement (11/27/18 1343) RLE Sensation: Full sensation (11/27/18 1343)        Ihsan Nomura S

## 2018-11-27 NOTE — Progress Notes (Signed)
[facility] PROGRESS NOTE  CLAUDETT OSTERLUND Vasquez:[id number] DOB: June 08, 1930 DOA: 11/22/2018 PCP: Mirna Mires, MD  Assessment/Plan: 1. Left hip fracture, status post left hip arthroplasty.  Evaluated by PT who recommends home with home health therapy which family is agreeable to.  Continue PRN Norco every 4 hours moderate pain, Robaxin every 6 hours muscle spasms, Tylenol every 6 hours mild pain  2. Acute blood loss anemia.  Likely postoperative.  S/p blood transfusion of 2 units on 2/5 with appropriate response. Will monitor additional day to ensure remains stable before potential discharge.  Remains asymptomatic  3. Right breast mass.  Discussed with family who elected not to pursue further evaluation given significant comorbidities/poor prognosis. she was also previously evaluated by her primary care doctor as well)  4. Advanced Alzheimer's dementia.  Not very interactive on exam, speaks words but not sensical to conversation( which is her baseline per her son).  Delirium precautions   5. Hypertension, stable.  Continue home amlodipine.  Code Status: Full code Family Communication: Spoke with her Son [name] at bedside  Disposition Plan: Need to ensure hemoglobin remained stable after transfusion next 24 hours before potential discharge home with home health therapy  Consultants:  Orthopedics  Procedures:  Left hip hemiarthroplasty, 11/23/2018  Antibiotics:  None  HPI/Subjective:  Erica Vasquez is a 85 y.o. year old female with medical history significant for of dementia, hypertension who presented on 11/22/2018 with injury to her left hip after trying to get out of her wheelchair and falling and was found to have left hip fracture  No acute events overnight. Received 2 U blood yesterday. Ate breakfast with help of son  Objective: Vitals:   11/27/18 1058 11/27/18 1343  BP: (!) 148/61 (!) 145/74  Pulse:  69  Resp:    Temp:  98.6 F (37 C)  SpO2:  100%     Intake/Output Summary (Last 24 hours) at 11/27/2018 1730 Last data filed at 11/27/2018 1343 Gross per 24 hour  Intake 360 ml  Output 700 ml  Net -340 ml   There were no vitals filed for this visit.  Exam:   General: Lying in bed, no acute distress  Eyes: blind, eyes remained tightly closed throughout exam  Cardiovascular: Regular rate and rhythm, no appreciable murmurs rubs or gallops, no peripheral edema  Respiratory: Normal respiratory effort on room air  Skin dense hard nodule in outer quadrant of right breast  Neurologic speaks to voice but nonsensical, and not able to answer any questions of orientation (stable at baseline) gripping sheet in hand  Data Reviewed: Basic Metabolic Panel: Recent Labs  Lab 11/22/18 2359 11/23/18 0411 11/24/18 0428 11/25/18 0554  NA 140 141 141 141  K 4.1 3.3* 4.0 4.3  CL 103 107 107 111  CO2 20* 24 24 25   GLUCOSE 112* 129* 141* 93  BUN 12 8 12 18   CREATININE 0.81 0.65 0.88 1.02*  CALCIUM 8.8* 8.8* 8.2* 8.0*   Liver Function Tests: Recent Labs  Lab 11/22/18 2359  AST 29  ALT 10  ALKPHOS 42  BILITOT 1.2  PROT 5.6*  ALBUMIN 3.4*   No results for input(s): LIPASE, AMYLASE in the last 168 hours. No results for input(s): AMMONIA in the last 168 hours. CBC: Recent Labs  Lab 11/22/18 2359 11/23/18 0411 11/24/18 0428 11/25/18 0554 11/26/18 0640 11/27/18 0954  WBC 6.4 5.6 4.9 4.2 3.3* 3.7*  NEUTROABS 4.9  --   --   --   --   --  HGB 11.8* 11.3* 9.3* 7.7* 6.6* 10.3*  HCT 38.3 36.0 28.8* 24.5* 21.7* 32.8*  MCV 86.8 86.1 84.7 86.0 87.9 86.3  PLT 190 155 128* 99* 90* 132*   Cardiac Enzymes: Recent Labs  Lab 11/22/18 2359  TROPONINI <0.03   BNP (last 3 results) No results for input(s): BNP in the last 8760 hours.  ProBNP (last 3 results) No results for input(s): PROBNP in the last 8760 hours.  CBG: No results for input(s): GLUCAP in the last 168 hours.  Recent Results (from the past 240 hour(s))  Surgical PCR  screen     Status: None   Collection Time: 11/23/18  1:11 PM  Result Value Ref Range Status   MRSA, PCR NEGATIVE NEGATIVE Final   Staphylococcus aureus NEGATIVE NEGATIVE Final    Comment: (NOTE) The Xpert SA Assay (FDA approved for NASAL specimens in patients 22 years of age and older), is one component of a comprehensive surveillance program. It is not intended to diagnose infection nor to guide or monitor treatment. Performed at [facility], [address], [place], [place] [address]      Studies: No results found.  Scheduled Meds: . amLODipine  2.5 mg Oral Daily  . aspirin EC  325 mg Oral Q breakfast  . celecoxib  200 mg Oral BID  . docusate sodium  100 mg Oral BID  . enoxaparin (LOVENOX) injection  40 mg Subcutaneous Q24H  . pantoprazole  40 mg Oral Daily  . potassium chloride  40 mEq Oral Once  . povidone-iodine  2 application Topical Once  . traMADol  50 mg Oral Q6H   Continuous Infusions: . methocarbamol (ROBAXIN) IV      Principal Problem:   Hip fracture (HCC) Active Problems:   Fall   Blindness   Essential hypertension, benign   Alzheimer disease (HCC)   Postoperative anemia due to acute blood loss      Erica Vasquez  [facility]

## 2018-11-27 NOTE — Plan of Care (Signed)
  Problem: Pain Managment: Goal: General experience of comfort will improve Outcome: Progressing   Problem: Safety: Goal: Ability to remain free from injury will improve Outcome: Progressing   

## 2018-11-27 NOTE — Progress Notes (Signed)
[facility] PROGRESS NOTE  Erica Vasquez ZOX:[id number] DOB: 1930/06/19 DOA: 11/22/2018 PCP: Mirna MiresHill, [name], MD  Assessment/Plan: 1. Left hip fracture, status post left hip arthroplasty.  Evaluated by PT who recommends home with home health therapy which family is agreeable to.  Continue PRN Norco every 4 hours moderate pain, Robaxin every 6 hours muscle spasms, Tylenol every 6 hours mild pain  2. Acute blood loss anemia.  Likely postoperative.  S/p blood transfusion of 2 units on 2/5 with appropriate response. Will monitor additional day to ensure remains stable before potential discharge.  Remains asymptomatic  3. Right breast mass.  Discussed with family who elected not to pursue further evaluation given significant comorbidities/poor prognosis. she was also previously evaluated by her primary care doctor as well)  4. Advanced Alzheimer's dementia.  Not very interactive on exam, speaks words but not sensical to conversation( which is her baseline per her son).  Delirium precautions   5. Hypertension, stable.  Continue home amlodipine.  Code Status: Full code Family Communication: Spoke with her Son Erica Vasquez at bedside  Disposition Plan: Need to ensure hemoglobin remained stable after transfusion next 24 hours before potential discharge home with home health therapy  Consultants:  Orthopedics  Procedures:  Left hip hemiarthroplasty, 11/23/2018  Antibiotics:  None  HPI/Subjective:  Erica Vasquez is a 85 y.o. year old female with medical history significant for of dementia, hypertension who presented on 11/22/2018 with injury to her left hip after trying to get out of her wheelchair and falling and was found to have left hip fracture  No acute events overnight. Received 2 U blood yesterday. Ate breakfast with help of son  Objective: Vitals:   11/27/18 1058 11/27/18 1343  BP: (!) 148/61 (!) 145/74  Pulse:  69  Resp:    Temp:  98.6 F (37 C)  SpO2:  100%     Intake/Output Summary (Last 24 hours) at 11/27/2018 1726 Last data filed at 11/27/2018 1343 Gross per 24 hour  Intake 360 ml  Output 700 ml  Net -340 ml   There were no vitals filed for this visit.  Exam:   General: Lying in bed, no acute distress  Eyes: blind, eyes remained tightly closed throughout exam  Cardiovascular: Regular rate and rhythm, no appreciable murmurs rubs or gallops, no peripheral edema  Respiratory: Normal respiratory effort on room air  Skin dense hard nodule in outer quadrant of right breast  Neurologic speaks to voice but nonsensical, and not able to answer any questions of orientation (stable at baseline) gripping sheet in hand  Data Reviewed: Basic Metabolic Panel: Recent Labs  Lab 11/22/18 2359 11/23/18 0411 11/24/18 0428 11/25/18 0554  NA 140 141 141 141  K 4.1 3.3* 4.0 4.3  CL 103 107 107 111  CO2 20* 24 24 25   GLUCOSE 112* 129* 141* 93  BUN 12 8 12 18   CREATININE 0.81 0.65 0.88 1.02*  CALCIUM 8.8* 8.8* 8.2* 8.0*   Liver Function Tests: Recent Labs  Lab 11/22/18 2359  AST 29  ALT 10  ALKPHOS 42  BILITOT 1.2  PROT 5.6*  ALBUMIN 3.4*   No results for input(s): LIPASE, AMYLASE in the last 168 hours. No results for input(s): AMMONIA in the last 168 hours. CBC: Recent Labs  Lab 11/22/18 2359 11/23/18 0411 11/24/18 0428 11/25/18 0554 11/26/18 0640 11/27/18 0954  WBC 6.4 5.6 4.9 4.2 3.3* 3.7*  NEUTROABS 4.9  --   --   --   --   --  HGB 11.8* 11.3* 9.3* 7.7* 6.6* 10.3*  HCT 38.3 36.0 28.8* 24.5* 21.7* 32.8*  MCV 86.8 86.1 84.7 86.0 87.9 86.3  PLT 190 155 128* 99* 90* 132*   Cardiac Enzymes: Recent Labs  Lab 11/22/18 2359  TROPONINI <0.03   BNP (last 3 results) No results for input(s): BNP in the last 8760 hours.  ProBNP (last 3 results) No results for input(s): PROBNP in the last 8760 hours.  CBG: No results for input(s): GLUCAP in the last 168 hours.  Recent Results (from the past 240 hour(s))  Surgical PCR  screen     Status: None   Collection Time: 11/23/18  1:11 PM  Result Value Ref Range Status   MRSA, PCR NEGATIVE NEGATIVE Final   Staphylococcus aureus NEGATIVE NEGATIVE Final    Comment: (NOTE) The Xpert SA Assay (FDA approved for NASAL specimens in patients 73 years of age and older), is one component of a comprehensive surveillance program. It is not intended to diagnose infection nor to guide or monitor treatment. Performed at [facility], [address] [place], [place] [address]      Studies: No results found.  Scheduled Meds: . amLODipine  2.5 mg Oral Daily  . aspirin EC  325 mg Oral Q breakfast  . celecoxib  200 mg Oral BID  . docusate sodium  100 mg Oral BID  . enoxaparin (LOVENOX) injection  40 mg Subcutaneous Q24H  . pantoprazole  40 mg Oral Daily  . potassium chloride  40 mEq Oral Once  . povidone-iodine  2 application Topical Once  . traMADol  50 mg Oral Q6H   Continuous Infusions: . methocarbamol (ROBAXIN) IV      Principal Problem:   Hip fracture [facility]) Active Problems:   Fall   Essential hypertension, benign   Alzheimer disease (HCC)      Erica Vasquez  [facility]

## 2018-11-27 NOTE — Clinical Social Work Note (Signed)
CSW acknowledges SNF consult. Per notes, plan for discharge home when stable.  CSW signing off.   Charlynn Court, CSW [phone]

## 2018-11-27 NOTE — Progress Notes (Addendum)
   Subjective: 4 Days Post-Op Procedure(s) (LRB): ARTHROPLASTY BIPOLAR HIP (HEMIARTHROPLASTY) (Left) Patient reports pain as mild.    Objective: Vital signs in last 24 hours: Temp:  [97.2 F (36.2 C)-98.9 F (37.2 C)] 98.1 F (36.7 C) (02/06 0500) Pulse Rate:  [66-104] 66 (02/06 0500) Resp:  [15-17] 15 (02/06 0500) BP: (130-154)/(61-78) 148/61 (02/06 0500) SpO2:  [94 %-100 %] 94 % (02/06 0500)  Intake/Output from previous day: 02/05 0701 - 02/06 0700 In: 390 [P.O.:360; Blood:30] Out: 650 [Urine:650] Intake/Output this shift: No intake/output data recorded.  Recent Labs    11/25/18 0554 11/26/18 0640  HGB 7.7* 6.6*   Recent Labs    11/25/18 0554 11/26/18 0640  WBC 4.2 3.3*  RBC 2.85* 2.47*  HCT 24.5* 21.7*  PLT 99* 90*   Recent Labs    11/25/18 0554  NA 141  K 4.3  CL 111  CO2 25  BUN 18  CREATININE 1.02*  GLUCOSE 93  CALCIUM 8.0*   No results for input(s): LABPT, INR in the last 72 hours.  Neurologically intact No results found.  Assessment/Plan: 4 Days Post-Op Procedure(s) (LRB): ARTHROPLASTY BIPOLAR HIP (HEMIARTHROPLASTY) (Left) Up with therapy. Blood finishing.     Has right chest mass which would be are  General Surgery area.  Has been present for more than 2 yrs, son has noticed slow enlargement, not fixed to chest wall and with her dementia he wants to just leave it. He would like to take her back home after discharge  Eldred Manges 11/27/2018, 7:42 AM

## 2018-11-27 NOTE — Care Management Important Message (Signed)
Important Message  Patient Details  Name: Erica Vasquez MRN: [id number] Date of Birth: 08/26/1930   Medicare Important Message Given:  Yes    Boris Engelmann 11/27/2018, 2:30 PM

## 2018-11-27 NOTE — Progress Notes (Signed)
Physical Therapy Treatment Patient Details Name: Erica Vasquez MRN: [id number] DOB: September 15, 1930 Today's Date: 11/27/2018    History of Present Illness Pt is a 85 year old female suffered a left femoral neck fracture. PMH Alzheimer disease, Dementia, Blind, and cataract.     PT Comments    Pt with more grimacing today with movement. Total A for rolling for pericare and Total A for supine to sit and transfers. Requires increased time to relax posterior lean and sit EoB with minguard A. Once in recliner therapist instructed son and niece in PROM techniques within posterior hip precautions. D/c plans remain appropriate. PT will continue to follow acutely.    Follow Up Recommendations  Home health PT;Supervision/Assistance - 24 hour     Equipment Recommendations  None recommended by PT       Precautions / Restrictions Precautions Precautions: Posterior Hip Precaution Comments: Pt in L KI, PTA elected to keep on given pt dementia, hx of crossing legs, and anatomical tendency for hip IR  Required Braces or Orthoses: Knee Immobilizer - Left Knee Immobilizer - Left: (KI removed today, to be replaced for sleeping) Restrictions Weight Bearing Restrictions: Yes LLE Weight Bearing: Weight bearing as tolerated    Mobility  Bed Mobility Overal bed mobility: Needs Assistance Bed Mobility: Supine to Sit;Rolling Rolling: Total assist   Supine to sit: Total assist;+2 for physical assistance;HOB elevated     General bed mobility comments: utilized bed pad to assist in Rolling R and L to clean, use of bed pad to scoot hips to EOB; assist for all aspects of bed mobility  Transfers Overall transfer level: Needs assistance Equipment used: (+2 face to face with gait belt and bed pad) Transfers: Stand Pivot Transfers   Stand pivot transfers: Total assist       General transfer comment: holding onto therapists; total A for transfer     Balance Overall balance assessment: History of  Falls;Needs assistance Sitting-balance support: Feet supported;Bilateral upper extremity supported Sitting balance-Leahy Scale: Poor   Postural control: Posterior lean   Standing balance-Leahy Scale: Zero                              Cognition Arousal/Alertness: Awake/alert Behavior During Therapy: WFL for tasks assessed/performed Overall Cognitive Status: History of cognitive impairments - at baseline                                 General Comments: hx of Alzheimers and dementia(pt keeps eyes closed and verbalized minimally)      Exercises General Exercises - Lower Extremity Ankle Circles/Pumps: PROM;Both;20 reps;Seated Heel Slides: PROM;10 reps;Both;Seated Straight Leg Raises: PROM;Right;10 reps;Seated    General Comments General comments (skin integrity, edema, etc.): continued to [employer] for comfort during session       Pertinent Vitals/Pain Pain Assessment: Faces Faces Pain Scale: Hurts even more Pain Location: L hip Pain Descriptors / Indicators: Guarding Pain Intervention(s): Limited activity within patient's tolerance;Monitored during session;Repositioned           PT Goals (current goals can now be found in the care plan section) Acute Rehab PT Goals PT Goal Formulation: Patient unable to participate in goal setting Time For Goal Achievement: 12/08/18 Potential to Achieve Goals: Fair Progress towards PT goals: Progressing toward goals    Frequency    Min 5X/week      PT Plan Current plan remains appropriate  AM-PAC PT "6 Clicks" Mobility   Outcome Measure  Help needed turning from your back to your side while in a flat bed without using bedrails?: Total Help needed moving from lying on your back to sitting on the side of a flat bed without using bedrails?: Total Help needed moving to and from a bed to a chair (including a wheelchair)?: Total Help needed standing up from a chair using your arms (e.g.,  wheelchair or bedside chair)?: Total Help needed to walk in hospital room?: Total Help needed climbing 3-5 steps with a railing? : Total 6 Click Score: 6    End of Session Equipment Utilized During Treatment: Left knee immobilizer;Gait belt Activity Tolerance: Patient tolerated treatment well Patient left: in chair;with call bell/phone within reach;with chair alarm set Nurse Communication: Mobility status;Precautions;Weight bearing status PT Visit Diagnosis: Unsteadiness on feet (R26.81);Other abnormalities of gait and mobility (R26.89);Muscle weakness (generalized) (M62.81);History of falling (Z91.81);Difficulty in walking, not elsewhere classified (R26.2);Pain;Other symptoms and signs involving the nervous system (R29.898) Pain - Right/Left: Left Pain - part of body: Hip     Time: [id number] PT Time Calculation (min) (ACUTE ONLY): 23 min  Charges:  $Therapeutic Exercise: 8-22 mins $Therapeutic Activity: 8-22 mins                     Sharonna Vinje B. Beverely RisenVan Fleet PT, DPT Acute Rehabilitation Services Pager [phone] Office [phone]    Elon Alaslizabeth B Van Fleet 11/27/2018, 10:55 AM

## 2018-11-28 DIAGNOSIS — G309 Alzheimer's disease, unspecified: Secondary | ICD-10-CM

## 2018-11-28 LAB — BASIC METABOLIC PANEL
Anion gap: 9 (ref 5–15)
BUN: 8 mg/dL (ref 8–23)
CO2: 24 mmol/L (ref 22–32)
Calcium: 8.4 mg/dL — ABNORMAL LOW (ref 8.9–10.3)
Chloride: 107 mmol/L (ref 98–111)
Creatinine, Ser: 0.68 mg/dL (ref 0.44–1.00)
GFR calc Af Amer: >60 mL/min (ref >60)
GFR calc non Af Amer: >60 mL/min (ref >60)
GLUCOSE: 84 mg/dL (ref 70–99)
Potassium: 4.1 mmol/L (ref 3.5–5.1)
Sodium: 140 mmol/L (ref 135–145)

## 2018-11-28 LAB — CBC
HCT: 30.4 % — ABNORMAL LOW (ref 36.0–46.0)
Hemoglobin: 9.7 g/dL — ABNORMAL LOW (ref 12.0–15.0)
MCH: 27.2 pg (ref 26.0–34.0)
MCHC: 31.9 g/dL (ref 30.0–36.0)
MCV: 85.4 fL (ref 80.0–100.0)
Platelets: 140 10*3/uL — ABNORMAL LOW (ref 150–400)
RBC: 3.56 MIL/uL — AB (ref 3.87–5.11)
RDW: 14.2 % (ref 11.5–15.5)
WBC: 3.5 10*3/uL — ABNORMAL LOW (ref 4.0–10.5)
nRBC: 0 % (ref 0.0–0.2)

## 2018-11-28 LAB — TYPE AND SCREEN
ABO/RH(D): O POS
Antibody Screen: NEGATIVE
Unit division: 0

## 2018-11-28 LAB — BPAM RBC
BLOOD PRODUCT EXPIRATION DATE: 202003042359
ISSUE DATE / TIME: 202002060433
Unit Type and Rh: 5100

## 2018-11-28 MED ORDER — METHOCARBAMOL 500 MG PO TABS: 500.0000 mg | ORAL_TABLET | Freq: Four times a day (QID) | ORAL | 0 refills | Status: AC | PRN

## 2018-11-28 MED ORDER — DOCUSATE SODIUM 100 MG PO CAPS: 100.0000 mg | ORAL_CAPSULE | Freq: Two times a day (BID) | ORAL | 0 refills | Status: AC

## 2018-11-28 MED ORDER — CELECOXIB 200 MG PO CAPS: 200.0000 mg | ORAL_CAPSULE | Freq: Two times a day (BID) | ORAL | 0 refills | Status: DC

## 2018-11-28 MED ORDER — PANTOPRAZOLE SODIUM 40 MG PO TBEC: 40.0000 mg | DELAYED_RELEASE_TABLET | Freq: Every day | ORAL | 0 refills | Status: AC

## 2018-11-28 NOTE — Discharge Summary (Signed)
Discharge Summary  Erica Vasquez MBW:[id number] DOB: May 26, 1930  PCP: Mirna Mires, MD  Admit date: 11/22/2018 Discharge date: 11/28/2018   Time spent: < 25 minutes  Admitted From: home Disposition:  home  Recommendations for Outpatient Follow-up:  1. HHPT 2. F/u with Dr. Ophelia Charter in 2 weeks.  3. New medications: celebrex, aspirin 325 mg daily, short script of hydrocodone provided    Discharge Diagnoses:  Active Hospital Problems   Diagnosis Date Noted  . Hip fracture (HCC) 11/23/2018  . Postoperative anemia due to acute blood loss 11/27/2018  . Alzheimer disease (HCC) 01/31/2018  . Fall 01/25/2018  . Blindness 01/25/2018  . Essential hypertension, benign     Resolved Hospital Problems  No resolved problems to display.    Discharge Condition: Stable   CODE STATUS:FULL    History of present illness:   Erica Vasquez is a 85 y.o. year old female with medical history significant for ofdementia, hypertension who presented on 11/22/2018 with injury to her left hip after trying to get out of her wheelchair and falling to ground at her home and was found to have left hip fracture. Remaining hospital course addressed in problem based format below:   Hospital Course:   1. Acute subcapital fracture of left proximal femur. Underwent left hip arthroplasty.  Evaluated by PT who recommends home with home health therapy which family is agreeable to.  Will follow with Dr. Ophelia Charter as outpatient in 2 weeks for postoperative care.  Continue pain control with PRN Norco every 4 hours as needed moderate pain,  Robaxin PRN, and scheduled Celebrex   2. Mechanical fall.  Larey Seat while attempting to transfer independently from wheelchair.  Trauma imaging on admission was unremarkable (no acute abnormalities on lumbar spine, thoracic spine, chest x-ray, CT cervical spine and CT head)  3. Acute blood loss anemia, stable.  Likely postoperative anemia with nadir of 6.6.  Received 2 units of blood on 2/6  with improvement of hemoglobin 10.3.  On discharge hemoglobin 9.7.  No active signs or symptoms of bleeding prior to discharge.  4. Right breast mass.  Discussed with family who elected not to pursue further evaluation given significant comorbidities/poor prognosis.   5. Advanced Alzheimer's dementia.    At baseline minimally active.  Keeps eyes closed.  Occasionally speaks nonsensical words in conversation (per son this is her baseline).   6. Hypertension, stable.  Continue home amlodipine.   Consultations:  Ortho  Procedures/Studies: 11/23/2018 Left hip bipolar hemiarthroplasty  Discharge Exam: BP 127/76 (BP Location: Right Arm)   Pulse 87   Temp 98.5 F (36.9 C) (Oral)   Resp 16   SpO2 96%    General: Lying in bed, no acute distress  Eyes: blind, eyes remained tightly closed throughout exam  Cardiovascular: Regular rate and rhythm, no appreciable murmurs rubs or gallops, no peripheral edema  Respiratory: Normal respiratory effort on room air  Skin dense hard nodule in outer quadrant of right breast  Neurologic speaks to voice but nonsensical, and not able to answer any questions of orientation (stable at baseline) gripping sheet in hand    Discharge Instructions You were cared for by a hospitalist during your hospital stay. If you have any questions about your discharge medications or the care you received while you were in the hospital after you are discharged, you can call the unit and asked to speak with the hospitalist on call if the hospitalist that took care of you is not available. Once you are discharged,  your primary care physician will handle any further medical issues. Please note that NO REFILLS for any discharge medications will be authorized once you are discharged, as it is imperative that you return to your primary care physician (or establish a relationship with a primary care physician if you do not have one) for your aftercare needs so that they can  reassess your need for medications and monitor your lab values.  Discharge Instructions    Diet - low sodium heart healthy   Complete by:  As directed    Diet - low sodium heart healthy   Complete by:  As directed    Increase activity slowly   Complete by:  As directed    Increase activity slowly   Complete by:  As directed      Allergies as of 11/28/2018      Reactions   Bimatoprost Itching   Brimonidine Itching   Timolol Itching      Medication List    STOP taking these medications   oxyCODONE-acetaminophen 5-325 MG tablet Commonly known as:  PERCOCET   potassium chloride SA 20 MEQ tablet Commonly known as:  K-DUR,KLOR-CON     TAKE these medications   amLODipine 2.5 MG tablet Commonly known as:  NORVASC Take 1 tablet (2.5 mg total) by mouth daily.   aspirin 325 MG EC tablet Take 1 tablet (325 mg total) by mouth daily with breakfast.   celecoxib 200 MG capsule Commonly known as:  CELEBREX Take 1 capsule (200 mg total) by mouth 2 (two) times daily.   docusate sodium 100 MG capsule Commonly known as:  COLACE Take 1 capsule (100 mg total) by mouth 2 (two) times daily.   HYDROcodone-acetaminophen 5-325 MG tablet Commonly known as:  NORCO/VICODIN Take 1 tablet by mouth every 6 (six) hours as needed for moderate pain (pain score 4-6).   methocarbamol 500 MG tablet Commonly known as:  ROBAXIN Take 1 tablet (500 mg total) by mouth every 6 (six) hours as needed for muscle spasms.   pantoprazole 40 MG tablet Commonly known as:  PROTONIX Take 1 tablet (40 mg total) by mouth daily. Start taking on:  November 29, 2018      Allergies  Allergen Reactions  . Bimatoprost Itching  . Brimonidine Itching  . Timolol Itching   Follow-up Information    [facility] Follow up.   Specialty:  Home Health Services Why:  A representative from [facility] will contact you to arrange start date and time for your therapy. Contact information: [address] [place]  [address] [place] [address] [phone]        Eldred MangesYates, Mark C, MD Follow up today.   Specialty:  Orthopedic Surgery Why:  needs return office visit 2 weeks postop Contact information: [address] [place] [address] [phone]            The results of significant diagnostics from this hospitalization (including imaging, microbiology, ancillary and laboratory) are listed below for reference.    Significant Diagnostic Studies: Dg Chest 1 View  Result Date: 11/23/2018 CLINICAL DATA:  Fall.  Hip deformity. EXAM: CHEST  1 VIEW COMPARISON:  None. FINDINGS: Cardiac silhouette is mildly enlarged, accentuated by rotation LEFT. Tortuous calcified aorta. No pleural effusion. Patchy RIGHT perihilar airspace opacity. Elevated RIGHT hemidiaphragm. Strandy densities LEFT lung base. No pneumothorax. Osteopenia. IMPRESSION: 1. Patchy RIGHT perihilar airspace opacity versus projectional artifact. LEFT lung base atelectasis/scar. Recommend follow-up PA and lateral views of the chest when  clinically able. 2. Mild cardiomegaly. 3. Aortic Atherosclerosis (ICD10-I70.0). Electronically Signed   By: Awilda Metroourtnay  Bloomer M.D.   On: 11/23/2018 01:22   Dg Thoracic Spine 2 View  Result Date: 11/23/2018 CLINICAL DATA:  Pain after a fall. EXAM: THORACIC SPINE 2 VIEWS COMPARISON:  None. FINDINGS: Thoracic scoliosis convex towards the right. Diffuse bone demineralization. No anterior subluxations. Diffuse degenerative changes with narrowed interspaces and endplate hypertrophic change. No paraspinal soft tissue swelling. No focal bone lesion or bone destruction. IMPRESSION: Degenerative changes and scoliosis of the thoracic spine. No acute displaced fractures identified. Electronically Signed   By: Burman NievesWilliam  Stevens M.D.   On: 11/23/2018 01:27   Dg Lumbar Spine 2-3 Views  Result Date: 11/23/2018 CLINICAL DATA:  Pain after a fall. EXAM: LUMBAR SPINE - 2-3 VIEW COMPARISON:  None. FINDINGS: Diffuse bone  demineralization. Lumbar scoliosis convex towards the right. Diffuse degenerative changes with narrowed interspaces and endplate hypertrophic changes throughout. Anterior compression of the L1 vertebra with about 50% loss of height anteriorly. Suggestion of mild retropulsion of fracture fragments. No prior comparison studies are available in chronicity is indeterminate. Visualized sacrum appears intact. IMPRESSION: Age-indeterminate anterior compression of the L1 vertebra. Degenerative changes and scoliosis of the lumbar spine. Electronically Signed   By: Burman NievesWilliam  Stevens M.D.   On: 11/23/2018 01:26   Ct Head Wo Contrast  Result Date: 11/23/2018 CLINICAL DATA:  Patient fell out of wheelchair landing on left hip. Dementia. EXAM: CT HEAD WITHOUT CONTRAST CT CERVICAL SPINE WITHOUT CONTRAST TECHNIQUE: Multidetector CT imaging of the head and cervical spine was performed following the standard protocol without intravenous contrast. Multiplanar CT image reconstructions of the cervical spine were also generated. COMPARISON:  01/25/2018 CT head FINDINGS: CT HEAD FINDINGS Brain: Moderate degree of sulcal and ventricular prominence, stable in appearance consistent with atrophy. Mild-to-moderate small vessel ischemic disease of periventricular and subcortical white matter. No acute intracranial hemorrhage. No intra-axial mass nor extra-axial fluid. Midline fourth ventricle basal cisterns without effacement. Vascular: Moderate atherosclerosis of the carotid siphons. No hyperdense vessel sign. Skull: Intact Sinuses/Orbits: Right cataract extraction. Intact orbits and globes. No acute sinus disease. Other: Clear mastoids. CT CERVICAL SPINE FINDINGS Alignment: Reversal of cervical lordosis attributable to multilevel degenerative disc disease. Intact craniocervical relationship. Intact atlantodental interval. Skull base and vertebrae: Intact skull base. No acute cervical spine fracture. No jumped or perched facets. Soft tissues  and spinal canal: No prevertebral fluid or swelling. No visible canal hematoma. Disc levels: Mild disc flattening C2-3 with moderate-to-marked disc flattening C3 through T1. Small posterior marginal osteophytes are identified with calcified central disc-osteophyte complex at C4-5. Bilateral uncovertebral joint osteoarthritis is seen C3 through C7. Mild bilateral C5-6 foraminal encroachment from uncinate spurring Upper chest: Ectatic aortic arch, partially imaged. The visualized lungs are nonacute. Other: None IMPRESSION: 1. Atrophy with chronic small vessel ischemic disease. No acute intracranial abnormality. 2. Cervical spondylosis without acute cervical spine fracture. Electronically Signed   By: Tollie Ethavid  Kwon M.D.   On: 11/23/2018 01:00   Ct Cervical Spine Wo Contrast  Result Date: 11/23/2018 CLINICAL DATA:  Patient fell out of wheelchair landing on left hip. Dementia. EXAM: CT HEAD WITHOUT CONTRAST CT CERVICAL SPINE WITHOUT CONTRAST TECHNIQUE: Multidetector CT imaging of the head and cervical spine was performed following the standard protocol without intravenous contrast. Multiplanar CT image reconstructions of the cervical spine were also generated. COMPARISON:  01/25/2018 CT head FINDINGS: CT HEAD FINDINGS Brain: Moderate degree of sulcal and ventricular prominence, stable in appearance consistent with atrophy. Mild-to-moderate  small vessel ischemic disease of periventricular and subcortical white matter. No acute intracranial hemorrhage. No intra-axial mass nor extra-axial fluid. Midline fourth ventricle basal cisterns without effacement. Vascular: Moderate atherosclerosis of the carotid siphons. No hyperdense vessel sign. Skull: Intact Sinuses/Orbits: Right cataract extraction. Intact orbits and globes. No acute sinus disease. Other: Clear mastoids. CT CERVICAL SPINE FINDINGS Alignment: Reversal of cervical lordosis attributable to multilevel degenerative disc disease. Intact craniocervical relationship.  Intact atlantodental interval. Skull base and vertebrae: Intact skull base. No acute cervical spine fracture. No jumped or perched facets. Soft tissues and spinal canal: No prevertebral fluid or swelling. No visible canal hematoma. Disc levels: Mild disc flattening C2-3 with moderate-to-marked disc flattening C3 through T1. Small posterior marginal osteophytes are identified with calcified central disc-osteophyte complex at C4-5. Bilateral uncovertebral joint osteoarthritis is seen C3 through C7. Mild bilateral C5-6 foraminal encroachment from uncinate spurring Upper chest: Ectatic aortic arch, partially imaged. The visualized lungs are nonacute. Other: None IMPRESSION: 1. Atrophy with chronic small vessel ischemic disease. No acute intracranial abnormality. 2. Cervical spondylosis without acute cervical spine fracture. Electronically Signed   By: Tollie Eth M.D.   On: 11/23/2018 01:00   Dg Hip Port Unilat With Pelvis 1v Left  Result Date: 11/23/2018 CLINICAL DATA:  Left hip arthroplasty. EXAM: DG HIP (WITH OR WITHOUT PELVIS) 1V PORT LEFT COMPARISON:  Hip radiographs earlier today FINDINGS: A single portable radiograph centered over the left femur demonstrates interval performance of a left hip arthroplasty. The prosthetic femoral head was incompletely imaged. No acute fracture is seen about the femoral stem. IMPRESSION: Interval left hip arthroplasty as above without acute osseous abnormality identified. Electronically Signed   By: Sebastian Ache M.D.   On: 11/23/2018 15:46   Dg Hip Unilat With Pelvis 2-3 Views Left  Result Date: 11/23/2018 CLINICAL DATA:  Status post left hip hemiarthroplasty. EXAM: DG HIP (WITH OR WITHOUT PELVIS) 2-3V LEFT COMPARISON:  None. FINDINGS: Left hip hemiarthroplasty appears well-seated and well aligned. There is no acute fracture or evidence of an operative complication. Well-positioned right hip hemiarthroplasty. Bones are diffusely demineralized. IMPRESSION: Well-positioned left  hip hemiarthroplasty. Electronically Signed   By: Amie Portland M.D.   On: 11/23/2018 16:43   Dg Hips Bilat W Or Wo Pelvis 3-4 Views  Result Date: 11/23/2018 CLINICAL DATA:  Fall. Hip deformity. EXAM: DG HIP (WITH OR WITHOUT PELVIS) 3-4V BILAT COMPARISON:  01/25/2018 FINDINGS: Diffuse bone demineralization. Pelvis appears intact. SI joints and symphysis pubis are not displaced. Right hip arthroplasty. Components appear well seated. Acute transverse subcapital fracture of the left proximal femur with varus angulation of the fracture fragments. No definite extension into the inter trochanteric region although the greater trochanteric margin is somewhat indistinct superiorly. Degenerative changes in the left hip and lower lumbar spine. Calcified phleboliths. Large amount of stool in the rectum. IMPRESSION: 1. Acute subcapital fracture of the left proximal femur with varus angulation. 2. Previous right hip arthroplasty.  Hardware intact. Electronically Signed   By: Burman Nieves M.D.   On: 11/23/2018 01:24    Microbiology: Recent Results (from the past 240 hour(s))  Surgical PCR screen     Status: None   Collection Time: 11/23/18  1:11 PM  Result Value Ref Range Status   MRSA, PCR NEGATIVE NEGATIVE Final   Staphylococcus aureus NEGATIVE NEGATIVE Final    Comment: (NOTE) The Xpert SA Assay (FDA approved for NASAL specimens in patients 68 years of age and older), is one component of a comprehensive surveillance program. It  is not intended to diagnose infection nor to guide or monitor treatment. Performed at [facility], [address] [place], [place] [address]      Labs: Basic Metabolic Panel: Recent Labs  Lab 11/22/18 2359 11/23/18 0411 11/24/18 0428 11/25/18 0554 11/28/18 0329  NA 140 141 141 141 140  K 4.1 3.3* 4.0 4.3 4.1  CL 103 107 107 111 107  CO2 20* 24 24 25 24   GLUCOSE 112* 129* 141* 93 84  BUN 12 8 12 18 8   CREATININE 0.81 0.65 0.88 1.02* 0.68  CALCIUM 8.8*  8.8* 8.2* 8.0* 8.4*   Liver Function Tests: Recent Labs  Lab 11/22/18 2359  AST 29  ALT 10  ALKPHOS 42  BILITOT 1.2  PROT 5.6*  ALBUMIN 3.4*   No results for input(s): LIPASE, AMYLASE in the last 168 hours. No results for input(s): AMMONIA in the last 168 hours. CBC: Recent Labs  Lab 11/22/18 2359  11/24/18 0428 11/25/18 0554 11/26/18 0640 11/27/18 0954 11/28/18 0329  WBC 6.4   < > 4.9 4.2 3.3* 3.7* 3.5*  NEUTROABS 4.9  --   --   --   --   --   --   HGB 11.8*   < > 9.3* 7.7* 6.6* 10.3* 9.7*  HCT 38.3   < > 28.8* 24.5* 21.7* 32.8* 30.4*  MCV 86.8   < > 84.7 86.0 87.9 86.3 85.4  PLT 190   < > 128* 99* 90* 132* 140*   < > = values in this interval not displayed.   Cardiac Enzymes: Recent Labs  Lab 11/22/18 2359  TROPONINI <0.03   BNP: BNP (last 3 results) No results for input(s): BNP in the last 8760 hours.  ProBNP (last 3 results) No results for input(s): PROBNP in the last 8760 hours.  CBG: No results for input(s): GLUCAP in the last 168 hours.     Signed:  [name], MD [facility] 11/28/2018, 2:15 PM

## 2018-11-28 NOTE — Progress Notes (Signed)
[facility] called and I spoke with Dreama and she put pt on schedule to be picked up. At this time, she said pt is 5th in line. I will notify family.

## 2018-11-28 NOTE — Progress Notes (Signed)
Occupational Therapy Treatment Patient Details Name: Erica Vasquez MRN: [id number] DOB: 1929-11-07 Today's Date: 11/28/2018    History of present illness Pt is a 85 year old female suffered a left femoral neck fracture. PMH Alzheimer disease, Dementia, Blind, and cataract.    OT comments  Pt making slow progress toward goals, pain and posterior lean limiting ability to participate in independent BADL and transfers this date. Pt with eyes closed, minimally engaged despite use of music this session. Focused session on bed mobility and functional t/fs, pt needing total A for all t/fs this date. Significant posterior lean limited pt ability to safely sit up. Pt consistently grasps onto objects/therapists and guards trunk and arms during mobility. Pt incontinent of urine this date. Stand pivot completed to recliner with total A +2, needing to lie recliner flat and boost pt up in chair to position correctly and safely for recliner. Pt requiring total care of this date, recommending pt seek SNF placement vs HHOT due to family comfort/ability to manage with total care needs. Will continue to follow acutely.    Follow Up Recommendations  SNF;Supervision/Assistance - 24 hour;Home health OT(per family comfort, pt needs total care)    Equipment Recommendations  None recommended by OT    Recommendations for Other Services      Precautions / Restrictions Precautions Precautions: Posterior Hip Precaution Booklet Issued: Yes (comment) Precaution Comments: Pt in L KI, PTA elected to keep on given pt dementia, hx of crossing legs, and anatomical tendency for hip IR  Required Braces or Orthoses: Knee Immobilizer - Left Restrictions Weight Bearing Restrictions: No LLE Weight Bearing: Weight bearing as tolerated       Mobility Bed Mobility Overal bed mobility: Needs Assistance Bed Mobility: Supine to Sit;Rolling Rolling: Total assist   Supine to sit: Total assist;+2 for physical assistance;HOB  elevated     General bed mobility comments: bed pad needed for additional A, pt with significant posterior lean  Transfers Overall transfer level: Needs assistance   Transfers: Stand Pivot Transfers   Stand pivot transfers: Total assist;+2 physical assistance;+2 safety/equipment      Lateral/Scoot Transfers: Total assist;+2 physical assistance;+2 safety/equipment General transfer comment: pt looks to grasp items/therapists and does not release grasp easily; pt offered no assistance during t/f and with significant posterior lean    Balance Overall balance assessment: History of Falls;Needs assistance Sitting-balance support: Feet supported;Bilateral upper extremity supported Sitting balance-Leahy Scale: Poor Sitting balance - Comments: starboard sit from therapist required to maintain balance Postural control: Posterior lean   Standing balance-Leahy Scale: Zero                             ADL either performed or assessed with clinical judgement   ADL Overall ADL's : Needs assistance/impaired                         Toilet Transfer: Total assistance;+2 for physical assistance;+2 for safety/equipment Toilet Transfer Details (indicate cue type and reason): Pt simulated toilet transfer from bed to recliner, +2 for safety, use of bed pad for additional assistance. Toileting- Clothing Manipulation and Hygiene: Total assistance Toileting - Clothing Manipulation Details (indicate cue type and reason): incontinent during session       General ADL Comments: pt is max - total A for all ADLs due to cognitive status, pain, and visual deficits     Vision Baseline Vision/History: Cataracts(blind) Patient Visual Report: Other (comment)(blind) Vision Assessment?:  Vision impaired- to be further tested in functional context Additional Comments: blind   Perception     Praxis      Cognition Arousal/Alertness: Awake/alert Behavior During Therapy: WFL for tasks  assessed/performed Overall Cognitive Status: History of cognitive impairments - at baseline                                 General Comments: hx of alzheimers at baseline, keeps eyes closed and minimally interacts with environment. Does not initiate convseration. Enjoys music        Exercises     Shoulder Instructions       General Comments continue to use [place] music for comfort during therapy    Pertinent Vitals/ Pain       Pain Assessment: Faces Faces Pain Scale: Hurts whole lot Pain Location: L hip Pain Descriptors / Indicators: Guarding Pain Intervention(s): Limited activity within patient's tolerance;Monitored during session;Repositioned  Home Living Family/patient expects to be discharged to:: Private residence                                        Prior Functioning/Environment              Frequency  Min 2X/week        Progress Toward Goals  OT Goals(current goals can now be found in the care plan section)  Progress towards OT goals: Not progressing toward goals - comment(little interaction, does not converse or initiate)  Acute Rehab OT Goals Patient Stated Goal: none stated this session OT Goal Formulation: Patient unable to participate in goal setting Time For Goal Achievement: 12/08/18 Potential to Achieve Goals: Fair  Plan Discharge plan remains appropriate;Frequency remains appropriate    Co-evaluation    PT/OT/SLP Co-Evaluation/Treatment: Yes Reason for Co-Treatment: Complexity of the patient's impairments (multi-system involvement);For patient/therapist safety;To address functional/ADL transfers PT goals addressed during session: Mobility/safety with mobility OT goals addressed during session: ADL's and self-care      AM-PAC OT "6 Clicks" Daily Activity     Outcome Measure   Help from another person eating meals?: A Lot Help from another person taking care of personal grooming?: A Lot Help from another  person toileting, which includes using toliet, bedpan, or urinal?: Total Help from another person bathing (including washing, rinsing, drying)?: Total Help from another person to put on and taking off regular upper body clothing?: Total Help from another person to put on and taking off regular lower body clothing?: Total 6 Click Score: 8    End of Session Equipment Utilized During Treatment: Left knee immobilizer  OT Visit Diagnosis: Repeated falls (R29.6);Other abnormalities of gait and mobility (R26.89)   Activity Tolerance Patient limited by pain   Patient Left in chair;with call bell/phone within reach;with family/visitor present   Nurse Communication Mobility status;Other (comment)(prewick replacement)        Time: 1040-1110 OT Time Calculation (min): 30 min  Charges: OT General Charges $OT Visit: 1 Visit OT Treatments $Self Care/Home Management : 8-22 mins  Dalphine Handing, MSOT, OTR/L Behavioral Health OT/ Acute Relief OT [facility] Office: [phone]  Dalphine Handing 11/28/2018, 1:26 PM

## 2018-11-28 NOTE — Progress Notes (Signed)
   Subjective: 5 Days Post-Op Procedure(s) (LRB): ARTHROPLASTY BIPOLAR HIP (HEMIARTHROPLASTY) (Left) Patient reports pain as mild.    Objective: Vital signs in last 24 hours: Temp:  [98.6 F (37 C)-99.3 F (37.4 C)] 99.3 F (37.4 C) (02/07 1002) Pulse Rate:  [69-94] 94 (02/07 1002) Resp:  [14-16] 16 (02/07 1002) BP: (145-166)/(74-81) 160/81 (02/07 1002) SpO2:  [94 %-100 %] 94 % (02/07 1002)  Intake/Output from previous day: 02/06 0701 - 02/07 0700 In: 800 [P.O.:800] Out: 1200 [Urine:1200] Intake/Output this shift: Total I/O In: 120 [P.O.:120] Out: -   Recent Labs    11/26/18 0640 11/27/18 0954 11/28/18 0329  HGB 6.6* 10.3* 9.7*   Recent Labs    11/27/18 0954 11/28/18 0329  WBC 3.7* 3.5*  RBC 3.80* 3.56*  HCT 32.8* 30.4*  PLT 132* 140*   Recent Labs    11/28/18 0329  NA 140  K 4.1  CL 107  CO2 24  BUN 8  CREATININE 0.68  GLUCOSE 84  CALCIUM 8.4*   No results for input(s): LABPT, INR in the last 72 hours.  dressing intact.  No results found.  Assessment/Plan: 5 Days Post-Op Procedure(s) (LRB): ARTHROPLASTY BIPOLAR HIP (HEMIARTHROPLASTY) (Left) Plan: home with son, office 1 to 2 wks.   Eldred Manges 11/28/2018, 1:07 PM

## 2018-11-28 NOTE — Progress Notes (Signed)
This charge RN paged [facility] regarding PTAR's request for "medical necessity form".

## 2018-11-28 NOTE — Progress Notes (Signed)
Physical Therapy Treatment Patient Details Name: Erica Vasquez MRN: [id number] DOB: Sep 25, 1930 Today's Date: 11/28/2018    History of Present Illness Pt is a 85 year old female suffered a left femoral neck fracture. PMH Alzheimer disease, Dementia, Blind, and cataract.     PT Comments    Patient seen for mobility progression. Patient continues to require total A +2 for all mobility. Pt maintained posterior bias while sitting EOB and attempting to transfer making mobility much more difficult this session.  There was no family present during session today.  Plan remains appropriate given pt's family is able to provide this level of assistance. Continue to progress as tolerated.  Follow Up Recommendations  Home health PT;Supervision/Assistance - 24 hour     Equipment Recommendations  None recommended by PT    Recommendations for Other Services       Precautions / Restrictions Precautions Precautions: Posterior Hip Precaution Booklet Issued: (precautions posted in room) Precaution Comments: Pt in L KI, PTA elected to keep on given pt dementia, hx of crossing legs, and anatomical tendency for hip IR  Required Braces or Orthoses: Knee Immobilizer - Left Restrictions Weight Bearing Restrictions: Yes LLE Weight Bearing: Weight bearing as tolerated    Mobility  Bed Mobility Overal bed mobility: Needs Assistance Bed Mobility: Supine to Sit;Rolling Rolling: Total assist   Supine to sit: Total assist;+2 for physical assistance;HOB elevated     General bed mobility comments: bed pad needed for additional A, pt with significant posterior lean  Transfers Overall transfer level: Needs assistance Equipment used: (+2 face to face with gait belt and bed pad) Transfers: Stand Pivot Transfers   Stand pivot transfers: Total assist;+2 physical assistance;+2 safety/equipment      Lateral/Scoot Transfers: Total assist;+2 physical assistance;+2 safety/equipment General transfer comment:  pt looks to grasp items/therapists and does not release grasp easily; pt offered no assistance during t/f and with significant posterior lean and resistant to assist for anterior translation of trunk; bilat LE blocked for safety  Ambulation/Gait                 Stairs             Wheelchair Mobility    Modified Rankin (Stroke Patients Only)       Balance Overall balance assessment: History of Falls;Needs assistance Sitting-balance support: Feet supported;Bilateral upper extremity supported Sitting balance-Leahy Scale: Zero Sitting balance - Comments: starboard sit from therapist required to maintain balance Postural control: Posterior lean   Standing balance-Leahy Scale: Zero                              Cognition Arousal/Alertness: Awake/alert Behavior During Therapy: WFL for tasks assessed/performed Overall Cognitive Status: History of cognitive impairments - at baseline                                 General Comments: hx of alzheimers at baseline, keeps eyes closed and minimally interacts with environment. Does not initiate convseration. Enjoys music      Exercises General Exercises - Lower Extremity Heel Slides: PROM;Left;Supine Hip ABduction/ADduction: PROM;Left;Supine    General Comments General comments (skin integrity, edema, etc.): continue to use [place] music for comfort during therapy      Pertinent Vitals/Pain Pain Assessment: Faces Faces Pain Scale: Hurts even more Pain Location: L hip (pt guarded with all movements) Pain Descriptors / Indicators: Guarding Pain Intervention(s):  Limited activity within patient's tolerance;Monitored during session;Repositioned    Home Living Family/patient expects to be discharged to:: Private residence                    Prior Function            PT Goals (current goals can now be found in the care plan section) Acute Rehab PT Goals Patient Stated Goal: none stated  this session Progress towards PT goals: Not progressing toward goals - comment(continues to require total A +2)    Frequency    Min 5X/week      PT Plan Current plan remains appropriate    Co-evaluation PT/OT/SLP Co-Evaluation/Treatment: Yes Reason for Co-Treatment: Complexity of the patient's impairments (multi-system involvement);For patient/therapist safety;To address functional/ADL transfers PT goals addressed during session: Mobility/safety with mobility OT goals addressed during session: ADL's and self-care      AM-PAC PT "6 Clicks" Mobility   Outcome Measure  Help needed turning from your back to your side while in a flat bed without using bedrails?: Total Help needed moving from lying on your back to sitting on the side of a flat bed without using bedrails?: Total Help needed moving to and from a bed to a chair (including a wheelchair)?: Total Help needed standing up from a chair using your arms (e.g., wheelchair or bedside chair)?: Total Help needed to walk in hospital room?: Total Help needed climbing 3-5 steps with a railing? : Total 6 Click Score: 6    End of Session Equipment Utilized During Treatment: Left knee immobilizer;Gait belt Activity Tolerance: Patient tolerated treatment well Patient left: in chair;with call bell/phone within reach;with chair alarm set Nurse Communication: Mobility status;Precautions;Weight bearing status PT Visit Diagnosis: Unsteadiness on feet (R26.81);Other abnormalities of gait and mobility (R26.89);Muscle weakness (generalized) (M62.81);History of falling (Z91.81);Difficulty in walking, not elsewhere classified (R26.2);Pain;Other symptoms and signs involving the nervous system (R29.898) Pain - Right/Left: Left Pain - part of body: Hip     Time: [id number] PT Time Calculation (min) (ACUTE ONLY): 33 min  Charges:  $Therapeutic Activity: 8-22 mins                     Erline LevineKellyn Eowyn Tabone, PTA Acute Rehabilitation Services Pager:  [phone]) [phone] Office: [phone]     Erica Vasquez 11/28/2018, 1:52 PM

## 2018-11-28 NOTE — Progress Notes (Signed)
This charge RN paged [employer] regarding PTAR's request for "medical necessity form".

## 2018-11-28 NOTE — Progress Notes (Signed)
This charge nurse received call back from Dr. Magnus IvanBlackman with [facility].   Advised that requested documentation "medical necessity form" will be signed on tomorrow morning.

## 2018-11-28 NOTE — Plan of Care (Signed)
  Problem: Education: Goal: Verbalization of understanding the information provided (i.e., activity precautions, restrictions, etc) will improve Outcome: Progressing Goal: Individualized Educational Video(s) Outcome: Progressing   Problem: Activity: Goal: Ability to ambulate and perform ADLs will improve Outcome: Progressing   Problem: Clinical Measurements: Goal: Postoperative complications will be avoided or minimized Outcome: Progressing   Problem: Self-Concept: Goal: Ability to maintain and perform role responsibilities to the fullest extent possible will improve Outcome: Progressing   Problem: Education: Goal: Knowledge of General Education information will improve Description Including pain rating scale, medication(s)/side effects and non-pharmacologic comfort measures Outcome: Progressing   Problem: Health Behavior/Discharge Planning: Goal: Ability to manage health-related needs will improve Outcome: Progressing   Problem: Clinical Measurements: Goal: Ability to maintain clinical measurements within normal limits will improve Outcome: Progressing Goal: Will remain free from infection Outcome: Progressing Goal: Diagnostic test results will improve Outcome: Progressing Goal: Respiratory complications will improve Outcome: Progressing Goal: Cardiovascular complication will be avoided Outcome: Progressing   Problem: Activity: Goal: Risk for activity intolerance will decrease Outcome: Progressing   Problem: Nutrition: Goal: Adequate nutrition will be maintained Outcome: Progressing   Problem: Coping: Goal: Level of anxiety will decrease Outcome: Progressing   Problem: Elimination: Goal: Will not experience complications related to bowel motility Outcome: Progressing Goal: Will not experience complications related to urinary retention Outcome: Progressing   Problem: Pain Managment: Goal: General experience of comfort will improve Outcome: Progressing    Problem: Safety: Goal: Ability to remain free from injury will improve Outcome: Progressing   Problem: Skin Integrity: Goal: Risk for impaired skin integrity will decrease Outcome: Progressing

## 2018-11-29 ENCOUNTER — Other Ambulatory Visit (INDEPENDENT_AMBULATORY_CARE_PROVIDER_SITE_OTHER): Payer: Self-pay | Admitting: Orthopaedic Surgery

## 2018-11-29 MED ORDER — TRAMADOL HCL 50 MG PO TABS: 50.0000 mg | ORAL_TABLET | Freq: Four times a day (QID) | ORAL | 0 refills | Status: DC | PRN

## 2018-11-29 NOTE — Progress Notes (Signed)
Pt has been discharged with PTAR with no distress noted.

## 2018-12-01 ENCOUNTER — Telehealth (INDEPENDENT_AMBULATORY_CARE_PROVIDER_SITE_OTHER): Payer: Self-pay | Admitting: Orthopaedic Surgery

## 2018-12-01 NOTE — Telephone Encounter (Signed)
[facility]  [phone]     Post op orders  3 times fort 1 wk  2 times a wk for 2 wk  HHA 2 times a wk for 3 wk for occupational therapy evaluation and social work

## 2018-12-01 NOTE — Telephone Encounter (Signed)
call

## 2018-12-01 NOTE — Telephone Encounter (Signed)
Please advise. Does patient need HHPT? °

## 2018-12-01 NOTE — Telephone Encounter (Signed)
This pt is s/p a bipolar hip 11/23/2018 this is a MY pt. Please advise on HHPT orders.

## 2018-12-02 ENCOUNTER — Telehealth (INDEPENDENT_AMBULATORY_CARE_PROVIDER_SITE_OTHER): Payer: Self-pay | Admitting: Orthopaedic Surgery

## 2018-12-02 NOTE — Telephone Encounter (Signed)
Dr. Ophelia Charter will not want bandage changed until office follow up unless saturated.  Please ask Fayrene Fearing in regards to bath aide.

## 2018-12-02 NOTE — Telephone Encounter (Signed)
Patient's niece Hydrographic surveyor) called advised patient need bandages changed and need [facility] to come out to assist with patient getting bathes and therapy. [name] said bandage was changed once that she know of in the hospital. [name] asked if Dr. Ophelia Charter can have an order sent to [facility]? The number to contact [facility] is [phone] or [phone]

## 2018-12-02 NOTE — Telephone Encounter (Signed)
Verbal order given for therapy

## 2018-12-03 ENCOUNTER — Telehealth (INDEPENDENT_AMBULATORY_CARE_PROVIDER_SITE_OTHER): Payer: Self-pay | Admitting: Orthopaedic Surgery

## 2018-12-03 NOTE — Telephone Encounter (Signed)
Patient's (son) Erica Vasquez & (niece) Erica Vasquez had major concerns regarding the patient;s post op care. Erica Vasquez states can not stand or move by herself and that [facility] has not been out to help with any after care therapy or nothing. They are really worried about the patient and that she will not be able to make it to her post op follow up appointment on Tuesday 12/09/18 @ 2:30pm. Please call [name] or Erica Vasquez @ [phone]

## 2018-12-03 NOTE — Telephone Encounter (Signed)
Per Wendall MolaJames  Jerry from [facility] need to follow up with patient asap. [place]arding to [place] to get in touch [place] with set  Home health soon as possible.

## 2018-12-03 NOTE — Telephone Encounter (Signed)
email sent to [place]. Awaiting response.

## 2018-12-03 NOTE — Telephone Encounter (Signed)
See other message from 12/03/2018

## 2018-12-03 NOTE — Telephone Encounter (Signed)
Dorene Sorrow returned call. HHPT will go out today. He will also have them look at wound when there and change bandages as well as teach family members. OT, HHA will also see patient to help out.

## 2018-12-03 NOTE — Telephone Encounter (Signed)
I called and spoke with both [name] and [name].  They were unsure if they should even get patient up and were wanting clarity from PT.  We discussed PT coming out today and that they would have a [facility] Aide as well. We also discussed the medications patient was taking and that she did not have to take the muscle relaxers as they put her to sleep. All questions were answered.  I asked for return call if anything further is needed.

## 2018-12-05 NOTE — Telephone Encounter (Signed)
Discuss with [facility] S[facility]

## 2018-12-08 ENCOUNTER — Inpatient Hospital Stay (INDEPENDENT_AMBULATORY_CARE_PROVIDER_SITE_OTHER): Payer: Medicare Other | Admitting: Orthopaedic Surgery

## 2018-12-09 ENCOUNTER — Ambulatory Visit (INDEPENDENT_AMBULATORY_CARE_PROVIDER_SITE_OTHER): Payer: Medicare Other | Admitting: Orthopaedic Surgery

## 2018-12-09 ENCOUNTER — Ambulatory Visit (INDEPENDENT_AMBULATORY_CARE_PROVIDER_SITE_OTHER): Payer: Medicare Other

## 2018-12-09 ENCOUNTER — Encounter (INDEPENDENT_AMBULATORY_CARE_PROVIDER_SITE_OTHER): Payer: Self-pay | Admitting: Orthopaedic Surgery

## 2018-12-09 VITALS — BP 155/70 | HR 69 | Ht 60.0 in | Wt 100.0 lb

## 2018-12-09 DIAGNOSIS — S72002A Fracture of unspecified part of neck of left femur, initial encounter for closed fracture: Secondary | ICD-10-CM

## 2018-12-09 NOTE — Progress Notes (Signed)
   Post-Op Visit Note   Patient: Erica Vasquez           Date of Birth: 07-26-1930           MRN: [id number] Visit Date: 12/09/2018 PCP: Mirna Mires, MD   Assessment & Plan: Follow-up hemiarthroplasty for femoral fracture left, cemented.  She had a blood blister over her right heel without drainage.  Family is been keeping a pillow behind her calf.  She can weight-bear as tolerated.  Chief Complaint:  Chief Complaint  Patient presents with  . Left Hip - Routine Post Op   Visit Diagnoses:  1. Closed fracture of left hip, initial encounter (HCC)     Plan: She can use Celebrex or Tylenol for pain as needed.  Return PRN.   Follow-Up Instructions: No follow-ups on file.   Orders:  Orders Placed This Encounter  Procedures  . XR HIP UNILAT W OR W/O PELVIS 2-3 VIEWS LEFT   No orders of the defined types were placed in this encounter.   Imaging: Xr Hip Unilat W Or W/o Pelvis 2-3 Views Left  Result Date: 12/09/2018 AP pelvis and frog-leg left lateral hip obtained and reviewed.  This shows well-positioned cemented hemiarthroplasty without evidence subsidence. Impression: Satisfactory left hip hemiarthroplasty   PMFS History: Patient Active Problem List   Diagnosis Date Noted  . Postoperative anemia due to acute blood loss 11/27/2018  . Hip fracture (HCC) 11/23/2018  . Chronic pain of right knee 03/11/2018  . Pain in right hip 02/10/2018  . Anemia 02/04/2018  . Constipation due to opioid therapy 01/31/2018  . Alzheimer disease (HCC) 01/31/2018  . Malnutrition of moderate degree 01/27/2018  . Fall 01/25/2018  . Fracture of femoral neck, right, closed (HCC) 01/25/2018  . Blindness 01/25/2018  . Hypokalemia 01/25/2018  . Lytic bone lesion of hip 01/25/2018  . Essential hypertension, benign    Past Medical History:  Diagnosis Date  . Alzheimer disease (HCC)    mild  . Arthritis   . Blind   . Cataract   . Cataract cortical, senile   . Dementia (HCC)   . Essential  hypertension   . Glaucoma   . Hypertension     Family History  Problem Relation Age of Onset  . Dementia Mother   . Stroke Mother   . Dementia Father   . Glaucoma Father   . Cataracts Father   . Hypertension Father   . Cataracts Sister   . Glaucoma Sister   . Hypertension Sister   . Retinal detachment Sister     Past Surgical History:  Procedure Laterality Date  . ABDOMINAL HYSTERECTOMY    . CATARACT EXTRACTION    . GLAUCOMA SURGERY Bilateral 2010   SLT  . HIP ARTHROPLASTY Left 11/23/2018   Procedure: ARTHROPLASTY BIPOLAR HIP (HEMIARTHROPLASTY);  Surgeon: Eldred Manges, MD;  [facility];  Service: Orthopedics;  Laterality: Left;  . TOTAL HIP ARTHROPLASTY Right 01/26/2018   Procedure: TOTAL HIP ARTHROPLASTY ANTERIOR APPROACH;  Surgeon: Tarry Kos, MD;  Location: [facility] OR;  Service: Orthopedics;  Laterality: Right;   Social History   Occupational History  . Not on file  Tobacco Use  . Smoking status: Never Smoker  . Smokeless tobacco: Never Used  Substance and Sexual Activity  . Alcohol use: Not Currently  . Drug use: Not Currently  . Sexual activity: Not on file

## 2018-12-15 ENCOUNTER — Telehealth (INDEPENDENT_AMBULATORY_CARE_PROVIDER_SITE_OTHER): Payer: Self-pay | Admitting: Orthopaedic Surgery

## 2018-12-15 NOTE — Telephone Encounter (Signed)
[facility]  [phone]  Cecille Rubin called from [facility] wanted to advise Dr.Yates the patient missed his visit with her due to another appointment on the 20th.

## 2018-12-15 NOTE — Telephone Encounter (Signed)
fyi

## 2018-12-17 ENCOUNTER — Encounter (HOSPITAL_COMMUNITY): Payer: Self-pay | Admitting: Orthopaedic Surgery

## 2018-12-30 ENCOUNTER — Other Ambulatory Visit (HOSPITAL_COMMUNITY): Payer: Self-pay | Admitting: Surgery

## 2018-12-30 NOTE — Telephone Encounter (Signed)
Time to stop . Was on it for 4 wks after broken hip

## 2018-12-30 NOTE — Telephone Encounter (Signed)
Ok to rf? 

## 2019-01-23 ENCOUNTER — Other Ambulatory Visit: Payer: Self-pay

## 2019-01-23 ENCOUNTER — Telehealth (INDEPENDENT_AMBULATORY_CARE_PROVIDER_SITE_OTHER): Payer: Self-pay

## 2019-01-23 ENCOUNTER — Ambulatory Visit (INDEPENDENT_AMBULATORY_CARE_PROVIDER_SITE_OTHER): Payer: Medicare Other | Admitting: Orthopaedic Surgery

## 2019-01-23 ENCOUNTER — Encounter (INDEPENDENT_AMBULATORY_CARE_PROVIDER_SITE_OTHER): Payer: Self-pay | Admitting: Orthopaedic Surgery

## 2019-01-23 VITALS — Ht 60.0 in | Wt 100.0 lb

## 2019-01-23 DIAGNOSIS — Z96649 Presence of unspecified artificial hip joint: Secondary | ICD-10-CM

## 2019-01-23 NOTE — Telephone Encounter (Signed)
error 

## 2019-01-23 NOTE — Progress Notes (Signed)
   Post-Op Visit Note   Patient: Erica Vasquez           Date of Birth: Nov 07, 1929           MRN: [id number] Visit Date: 01/23/2019 PCP: Mirna Mires, MD   Assessment & Plan: Patient returns post left hip hemiarthroplasty.  She has some problems with bilateral heel blood blisters.  She had previous right total hip arthroplasty by Dr. Roda Shutters on 01/26/2018.  Family is with her today and they have been walking her 3 times a day.leg length equal.   Chief Complaint:  Chief Complaint  Patient presents with  . Right Leg - Pain  . Left Leg - Pain   Visit Diagnoses:  1. S/P hip hemiarthroplasty     Plan: Bilateral 2.53cm calluses removed which was previous blood blister.  Underlying skin looks good.  No full-thickness skin problems.  Continue ambulation daily return PRN.  Follow-Up Instructions: No follow-ups on file.   Orders:  No orders of the defined types were placed in this encounter.  No orders of the defined types were placed in this encounter.   Imaging: No results found.  PMFS History: Patient Active Problem List   Diagnosis Date Noted  . Postoperative anemia due to acute blood loss 11/27/2018  . Hip fracture (HCC) 11/23/2018  . Chronic pain of right knee 03/11/2018  . Pain in right hip 02/10/2018  . Anemia 02/04/2018  . Constipation due to opioid therapy 01/31/2018  . Alzheimer disease (HCC) 01/31/2018  . Malnutrition of moderate degree 01/27/2018  . Fall 01/25/2018  . Blindness 01/25/2018  . Hypokalemia 01/25/2018  . Lytic bone lesion of hip 01/25/2018  . Essential hypertension, benign    Past Medical History:  Diagnosis Date  . Alzheimer disease (HCC)    mild  . Arthritis   . Blind   . Cataract   . Cataract cortical, senile   . Dementia (HCC)   . Essential hypertension   . Glaucoma   . Hypertension     Family History  Problem Relation Age of Onset  . Dementia Mother   . Stroke Mother   . Dementia Father   . Glaucoma Father   . Cataracts Father    . Hypertension Father   . Cataracts Sister   . Glaucoma Sister   . Hypertension Sister   . Retinal detachment Sister     Past Surgical History:  Procedure Laterality Date  . ABDOMINAL HYSTERECTOMY    . CATARACT EXTRACTION    . GLAUCOMA SURGERY Bilateral 2010   SLT  . HIP ARTHROPLASTY Left 11/23/2018   Procedure: ARTHROPLASTY BIPOLAR HIP (HEMIARTHROPLASTY);  Surgeon: Eldred Manges, MD;  Location: [facility] OR;  Service: Orthopedics;  Laterality: Left;  . TOTAL HIP ARTHROPLASTY Right 01/26/2018   Procedure: TOTAL HIP ARTHROPLASTY ANTERIOR APPROACH;  Surgeon: Tarry Kos, MD;  Location: [facility] OR;  Service: Orthopedics;  Laterality: Right;   Social History   Occupational History  . Not on file  Tobacco Use  . Smoking status: Never Smoker  . Smokeless tobacco: Never Used  Substance and Sexual Activity  . Alcohol use: Not Currently  . Drug use: Not Currently  . Sexual activity: Not on file

## 2019-08-19 ENCOUNTER — Encounter: Payer: Self-pay | Admitting: Emergency Medicine

## 2019-08-19 ENCOUNTER — Ambulatory Visit
Admission: EM | Admit: 2019-08-19 | Discharge: 2019-08-19 | Disposition: A | Discharge status: Home / Self Care | Payer: Medicare Other

## 2019-08-19 ENCOUNTER — Other Ambulatory Visit: Payer: Self-pay

## 2019-08-19 DIAGNOSIS — W19XXXA Unspecified fall, initial encounter: Secondary | ICD-10-CM | POA: Diagnosis not present

## 2019-08-19 DIAGNOSIS — M79605 Pain in left leg: Secondary | ICD-10-CM | POA: Diagnosis not present

## 2019-08-19 NOTE — Discharge Instructions (Signed)
85 year old female with history of dementia, blindness, bilateral total hip replacement, hypertension presenting for left lower extremity pain status post fall last night.  Denies head trauma, loss of consciousness.  Patient family members present, not there at time of fall.  Largely concerned due to patient's increased moaning and grabbing at left leg.  Patient was weightbearing initially after fall, though family feels pain has worsened since.  Given patient's comorbidities, limited exam second to nonverbal status, and inability to remain still for x-ray at this facility, patient was referred to ER for further evaluation.

## 2019-08-19 NOTE — ED Triage Notes (Signed)
Pt presents to [facility] for assessment with caregivers.  Patient was standing waiting to have help with her clothes being changed, and let go of the counter in the kitchen and fell backwards.  Family denies head injury.  Patient has severe dementia, and is non-verbal.  EMS checked her out last night, family states she has been rubbing her left leg a lot and moaning more today.  States she is walking fine.  Hx of hip replacement to both hips.

## 2019-08-19 NOTE — ED Provider Notes (Signed)
[facility]    CSN: [id number] Arrival date & time: 08/19/19  1821      History   Chief Complaint Chief Complaint  Patient presents with  . Fall    HPI Erica Vasquez is a 85 y.o. female with history of blindness, Alzheimer's dementia, presenting with her great niece, who provides history, for left leg pain status post fall last night.  [name], who provides assistance with ADLs, was helping patient wash up after dinner, when she let go of the kitchen counter and fell backwards.  It is unclear if patient fell and hit the floor, or fell back into her wheelchair-historian is unsure.  Denies head trauma, LOC.  Patient is nonverbal; has reportedly been moaning more today and grabbing towards her left leg (hip, thigh).  Family member states patient was able to ambulate with her walker when EMS was at the scene shortly after fall, though has not been doing as much today.  Patient is status post total hip replacement w/ history of hip fracture and lytic bone lesion of hip.  Family member denies choking, wheezing, change to bowel/bladder habit, though has not been with patient all day.    Past Medical History:  Diagnosis Date  . Alzheimer disease (HCC)    mild  . Arthritis   . Blind   . Cataract   . Cataract cortical, senile   . Dementia (HCC)   . Essential hypertension   . Glaucoma   . Hypertension     Patient Active Problem List   Diagnosis Date Noted  . Postoperative anemia due to acute blood loss 11/27/2018  . Hip fracture (HCC) 11/23/2018  . Chronic pain of right knee 03/11/2018  . Pain in right hip 02/10/2018  . Anemia 02/04/2018  . Constipation due to opioid therapy 01/31/2018  . Alzheimer disease (HCC) 01/31/2018  . Malnutrition of moderate degree 01/27/2018  . Fall 01/25/2018  . Blindness 01/25/2018  . Hypokalemia 01/25/2018  . Lytic bone lesion of hip 01/25/2018  . Essential hypertension, benign     Past Surgical History:  Procedure  Laterality Date  . ABDOMINAL HYSTERECTOMY    . CATARACT EXTRACTION    . GLAUCOMA SURGERY Bilateral 2010   SLT  . HIP ARTHROPLASTY Left 11/23/2018   Procedure: ARTHROPLASTY BIPOLAR HIP (HEMIARTHROPLASTY);  Surgeon: Eldred MangesYates, Mark C, MD;  [facility];  Service: Orthopedics;  Laterality: Left;  . TOTAL HIP ARTHROPLASTY Right 01/26/2018   Procedure: TOTAL HIP ARTHROPLASTY ANTERIOR APPROACH;  Surgeon: Tarry KosXu, Naiping M, MD;  Location: [facility] OR;  Service: Orthopedics;  Laterality: Right;    OB History   No obstetric history on file.      Home Medications    Prior to Admission medications   Medication Sig Start Date End Date Taking? Authorizing Provider  amLODipine (NORVASC) 2.5 MG tablet Take 1 tablet (2.5 mg total) by mouth daily. 01/30/18 11/23/18  Coralie KeensArrien, Mauricio Daniel, MD  aspirin EC 325 MG EC tablet Take 1 tablet (325 mg total) by mouth daily with breakfast. 11/25/18   Naida Sleightwens, James M, PA-C  celecoxib (CELEBREX) 200 MG capsule Take 1 capsule (200 mg total) by mouth 2 (two) times daily. 11/28/18   Roberto ScalesNettey, Shayla D, MD  docusate sodium (COLACE) 100 MG capsule Take 1 capsule (100 mg total) by mouth 2 (two) times daily. 11/28/18   Laverna PeaceNettey, Shayla D, MD  HYDROcodone-acetaminophen (NORCO/VICODIN) 5-325 MG tablet Take 1 tablet by mouth every 6 (six) hours as needed for moderate pain (pain score  4-6). 11/25/18   Lanae Crumbly, PA-C  methocarbamol (ROBAXIN) 500 MG tablet Take 1 tablet (500 mg total) by mouth every 6 (six) hours as needed for muscle spasms. 11/28/18   Desiree Hane, MD  pantoprazole (PROTONIX) 40 MG tablet Take 1 tablet (40 mg total) by mouth daily. 11/29/18   Desiree Hane, MD  traMADol (ULTRAM) 50 MG tablet Take 1-2 tablets (50-100 mg total) by mouth every 6 (six) hours as needed. 11/29/18   Mcarthur Rossetti, MD    Family History Family History  Problem Relation Age of Onset  . Dementia Mother   . Stroke Mother   . Dementia Father   . Glaucoma Father   . Cataracts Father   .  Hypertension Father   . Cataracts Sister   . Glaucoma Sister   . Hypertension Sister   . Retinal detachment Sister     Social History Social History   Tobacco Use  . Smoking status: Never Smoker  . Smokeless tobacco: Never Used  Substance Use Topics  . Alcohol use: Not Currently  . Drug use: Not Currently     Allergies   Bimatoprost, Brimonidine, and Timolol   Review of Systems Review of Systems  Unable to perform ROS: Patient nonverbal     Physical Exam Triage Vital Signs ED Triage Vitals [08/19/19 1832]  Enc Vitals Group     BP (!) 193/75     Pulse Rate 73     Resp 18     Temp (!) 97 F (36.1 C)     Temp Source Temporal     SpO2 95 %     Weight      Height      Head Circumference      Peak Flow      Pain Score      Pain Loc      Pain Edu?      Excl. in [place]?    No data found.  Updated Vital Signs BP (!) 193/75 (BP Location: Right Arm)   Pulse 73   Temp (!) 97 F (36.1 C) (Temporal)   Resp 18   SpO2 95%    Physical Exam Vitals signs and nursing note reviewed.  Constitutional:      Comments: Exam limited second to patient's overall health status: Appears chronically ill, does not open eyes, is responsive to painful stimuli (more so over left leg, hip).  HENT:     Head: Normocephalic and atraumatic.     Mouth/Throat:     Mouth: Mucous membranes are dry.  Cardiovascular:     Rate and Rhythm: Normal rate and regular rhythm.  Pulmonary:     Effort: Pulmonary effort is normal. No respiratory distress.     Breath sounds: No wheezing or rhonchi.  Musculoskeletal:     Right lower leg: No edema.     Left lower leg: No edema.  Skin:    Capillary Refill: Capillary refill takes less than 2 seconds.      UC Treatments / Results  Labs (all labs ordered are listed, but only abnormal results are displayed) Labs Reviewed - No data to display  EKG   Radiology No results found.  Procedures Procedures (including critical care time)   Medications Ordered in UC Medications - No data to display  Initial Impression / Assessment and Plan / UC Course  I have reviewed the triage vital signs and the nursing notes.  Pertinent labs & imaging results that were available during  my care of the patient were reviewed by me and considered in my medical decision making (see chart for details).     See discharge instructions for clinical impression.   This provider was able to speak with patient's niece, who "is in charge of her medicines" who agrees with overall assessment/plan and did not have any additional information to contribute to history. Final Clinical Impressions(s) / UC Diagnoses   Final diagnoses:  Pain of left lower extremity  Fall, initial encounter     Discharge Instructions     85 year old female with history of dementia, blindness, bilateral total hip replacement, hypertension presenting for left lower extremity pain status post fall last night.  Denies head trauma, loss of consciousness.  Patient family members present, not there at time of fall.  Largely concerned due to patient's increased moaning and grabbing at left leg.  Patient was weightbearing initially after fall, though family feels pain has worsened since.  Given patient's comorbidities, limited exam second to nonverbal status, and inability to remain still for x-ray at this facility, patient was referred to ER for further evaluation.    ED Prescriptions    None     PDMP not reviewed this encounter.   Odette Fraction [place], [place] 08/20/19 [phone]

## 2019-08-20 ENCOUNTER — Ambulatory Visit: Payer: Self-pay

## 2019-08-20 ENCOUNTER — Encounter: Payer: Self-pay | Admitting: Orthopaedic Surgery

## 2019-08-20 ENCOUNTER — Encounter: Payer: Self-pay | Admitting: Emergency Medicine

## 2019-08-20 ENCOUNTER — Ambulatory Visit: Payer: Medicare Other | Admitting: Orthopaedic Surgery

## 2019-08-20 DIAGNOSIS — M25551 Pain in right hip: Secondary | ICD-10-CM

## 2019-08-20 DIAGNOSIS — M25552 Pain in left hip: Secondary | ICD-10-CM

## 2019-08-20 DIAGNOSIS — M545 Low back pain, unspecified: Secondary | ICD-10-CM

## 2019-08-20 MED ORDER — TRAMADOL HCL 50 MG PO TABS: ORAL_TABLET | ORAL | 0 refills | Status: AC

## 2019-08-20 NOTE — Progress Notes (Signed)
Office Visit Note   Patient: Erica Vasquez           Date of Birth: [birth year]           MRN: [id number] Visit Date: 08/20/2019              Requested by: Iona Beard, [address] [place],  [place] [address] PCP: Iona Beard, MD   Assessment & Plan: Visit Diagnoses:  1. Low back pain, unspecified back pain laterality, unspecified chronicity, unspecified whether sciatica present   2. Bilateral hip pain     Plan: Impression is questionable left lateral hip contusion versus possible fracture through the lytic lesion to the left greater trochanter.  We will have her continue with symptomatic care for the next week.  Should her pain not improve in a week's time, family will call and we will obtain a CT scan of the left hip to further evaluate for possible fracture.  Otherwise, follow-up with [place] as needed.  The entire encounter was discussed with her great niece as well as her daughter.  Follow-Up Instructions: Return if symptoms worsen or fail to improve.   Orders:  Orders Placed This Encounter  Procedures  . XR Lumbar Spine 2-3 Views  . XR Pelvis 1-2 Views   Meds ordered this encounter  Medications  . traMADol (ULTRAM) 50 MG tablet    Sig: Take 1/2 to 1 tab po bid prn pain    Dispense:  30 tablet    Refill:  0      Procedures: No procedures performed   Clinical Data: No additional findings.   Subjective: Chief Complaint  Patient presents with  . Lower Back - Pain  . Pelvic Pain    HPI patient is a pleasant 85 year old female who comes in today with her great niece.  She is status post left hip hemiarthroplasty 12/11/2018 by Dr. Lorin Mercy and right hip hemiarthroplasty 01/2018 by Dr.Mercer Stallworth.  She was doing well until approximately 2 days ago.  She fell backwards onto her left side.  She had immediate pain and family called EMS.  She was able to stand up with use of her walker at that point, so the family opted to hold off on going to the ED and rather waited till  the next day where she was seen at an urgent care.  She comes in today for further evaluation and treatment recommendation.  She does not communicate much but really only moans when in pain.  It seems like she has been complaining more of left leg rather than right leg pain.  She has been standing less with her walker since 2 nights ago.  No bowel or bladder change.  She is eating and drinking normally.  She is taking children's Tylenol and an occasional tramadol for pain.  Review of Systems as detailed in the HPI.  All others reviewed and are negative.   Objective: Vital Signs: There were no vitals taken for this visit.  Physical Exam well-nourished female no acute distress.    Ortho Exam examination of bilateral lower extremity reveals a negative logroll and negative Fater.  Negative straight leg raise.  She does have moderate tenderness along the left greater trochanter.  No spinous tenderness.  No left or right sided lower back pain.  She has neurovascularly intact distally.  Specialty Comments:  No specialty comments available.  Imaging: Xr Lumbar Spine 2-3 Views  Result Date: 08/20/2019 Multilevel degenerative disc disease   Xr Pelvis  1-2 Views  Result Date: 08/20/2019 Well-seated prosthesis without complication.  She does have a lytic lesion to the left greater trochanter which is unchanged from imaging in February 2020.    PMFS History: Patient Active Problem List   Diagnosis Date Noted  . Postoperative anemia due to acute blood loss 11/27/2018  . Hip fracture (HCC) 11/23/2018  . Chronic pain of right knee 03/11/2018  . Pain in right hip 02/10/2018  . Anemia 02/04/2018  . Constipation due to opioid therapy 01/31/2018  . Alzheimer disease (HCC) 01/31/2018  . Malnutrition of moderate degree 01/27/2018  . Fall 01/25/2018  . Blindness 01/25/2018  . Hypokalemia 01/25/2018  . Lytic bone lesion of hip 01/25/2018  . Essential hypertension, benign    Past Medical History:   Diagnosis Date  . Alzheimer disease (HCC)    mild  . Arthritis   . Blind   . Cataract   . Cataract cortical, senile   . Dementia (HCC)   . Essential hypertension   . Glaucoma   . Hypertension     Family History  Problem Relation Age of Onset  . Dementia Mother   . Stroke Mother   . Dementia Father   . Glaucoma Father   . Cataracts Father   . Hypertension Father   . Cataracts Sister   . Glaucoma Sister   . Hypertension Sister   . Retinal detachment Sister     Past Surgical History:  Procedure Laterality Date  . ABDOMINAL HYSTERECTOMY    . CATARACT EXTRACTION    . GLAUCOMA SURGERY Bilateral 2010   SLT  . HIP ARTHROPLASTY Left 11/23/2018   Procedure: ARTHROPLASTY BIPOLAR HIP (HEMIARTHROPLASTY);  Surgeon: Eldred Manges, MD;  Location: [facility] OR;  Service: Orthopedics;  Laterality: Left;  . TOTAL HIP ARTHROPLASTY Right 01/26/2018   Procedure: TOTAL HIP ARTHROPLASTY ANTERIOR APPROACH;  Surgeon: Tarry Kos, MD;  Location: [facility] OR;  Service: Orthopedics;  Laterality: Right;   Social History   Occupational History  . Not on file  Tobacco Use  . Smoking status: Never Smoker  . Smokeless tobacco: Never Used  Substance and Sexual Activity  . Alcohol use: Not Currently  . Drug use: Not Currently  . Sexual activity: Not on file

## 2019-08-21 ENCOUNTER — Other Ambulatory Visit: Payer: Self-pay | Admitting: Physician Assistant

## 2019-08-21 MED ORDER — CELECOXIB 200 MG PO CAPS: 200.0000 mg | ORAL_CAPSULE | Freq: Two times a day (BID) | ORAL | 0 refills | Status: AC

## 2019-08-25 ENCOUNTER — Other Ambulatory Visit: Payer: Self-pay

## 2019-08-25 ENCOUNTER — Encounter (HOSPITAL_BASED_OUTPATIENT_CLINIC_OR_DEPARTMENT_OTHER): Payer: Medicare Other | Attending: Internal Medicine | Admitting: Internal Medicine

## 2019-08-25 DIAGNOSIS — L89620 Pressure ulcer of left heel, unstageable: Secondary | ICD-10-CM | POA: Diagnosis not present

## 2019-08-25 DIAGNOSIS — F0390 Unspecified dementia without behavioral disturbance: Secondary | ICD-10-CM | POA: Insufficient documentation

## 2019-08-25 DIAGNOSIS — R011 Cardiac murmur, unspecified: Secondary | ICD-10-CM | POA: Insufficient documentation

## 2019-08-25 DIAGNOSIS — F0280 Dementia in other diseases classified elsewhere without behavioral disturbance: Secondary | ICD-10-CM | POA: Insufficient documentation

## 2019-08-25 DIAGNOSIS — D649 Anemia, unspecified: Secondary | ICD-10-CM | POA: Insufficient documentation

## 2019-08-25 DIAGNOSIS — H409 Unspecified glaucoma: Secondary | ICD-10-CM | POA: Insufficient documentation

## 2019-08-25 DIAGNOSIS — E46 Unspecified protein-calorie malnutrition: Secondary | ICD-10-CM | POA: Diagnosis not present

## 2019-08-25 DIAGNOSIS — Z96643 Presence of artificial hip joint, bilateral: Secondary | ICD-10-CM | POA: Diagnosis not present

## 2019-08-25 DIAGNOSIS — I10 Essential (primary) hypertension: Secondary | ICD-10-CM | POA: Diagnosis not present

## 2019-08-25 DIAGNOSIS — H547 Unspecified visual loss: Secondary | ICD-10-CM | POA: Insufficient documentation

## 2019-08-25 DIAGNOSIS — R451 Restlessness and agitation: Secondary | ICD-10-CM | POA: Insufficient documentation

## 2019-08-25 DIAGNOSIS — M1990 Unspecified osteoarthritis, unspecified site: Secondary | ICD-10-CM | POA: Insufficient documentation

## 2019-08-31 NOTE — Progress Notes (Signed)
Erica Vasquez, Erica Vasquez ([id number]) Visit Report for 08/25/2019 Abuse/Suicide Risk Screen Details Patient Name: Date of Service: Erica Vasquez, Erica Vasquez 08/25/2019 2:45 PM Medical Record [id number] Patient Account Number: [id number] Date of Birth/Sex: Treating RN: January 21, 1930 (85 y.o. Nancy Fetter Primary Care Karlina Suares: Maggie Font Other Clinician: Referring Laron Angelini: Treating Fabiha Rougeau/Extender:Robson, Alisia Ferrari, Charlestine Night in Treatment: 0 Abuse/Suicide Risk Screen Items Answer ABUSE RISK SCREEN: Has anyone close to you tried to hurt or harm you recentlyo No Do you feel uncomfortable with anyone in your familyo No Has anyone forced you do things that you didnt want to doo No Electronic Signature(s) Signed: 08/31/2019 5:46:53 PM By: Levan Hurst RN, BSN Entered By: Levan Hurst on 08/25/2019 16:04:26 -------------------------------------------------------------------------------- Activities of Daily Living Details Patient Name: Date of Service: ANNAGRACE, CARR 08/25/2019 2:45 PM Medical Record Number:[id number] Patient Account Number: [id number] Date of Birth/Sex: Treating RN: 1930/05/10 (85 y.o. Nancy Fetter Primary Care Cloyd Ragas: Maggie Font Other Clinician: Referring Elson Ulbrich: Treating Randle Shatzer/Extender:Robson, Alisia Ferrari, Charlestine Night in Treatment: 0 Activities of Daily Living Items Answer Activities of Daily Living (Please select one for each item) Drive Automobile Not Able Take Medications Need Assistance Use Telephone Not Able Care for Appearance Need Assistance Use Toilet Need Assistance Bath / Shower Need Assistance Dress Self Need Assistance Feed Self Need Assistance Walk Need Assistance Get In / Out Bed Need Assistance Housework Not Able Prepare Meals Not Able Handle Money Not Able Shop for Self Not Able Electronic Signature(s) Signed: 08/31/2019 5:46:53 PM By: Levan Hurst RN, BSN Entered By: Levan Hurst on 08/25/2019  16:05:06 -------------------------------------------------------------------------------- Education Screening Details Patient Name: Date of Service: Erica Vasquez 08/25/2019 2:45 PM Medical Record [id number] Patient Account Number: [id number] Date of Birth/Sex: Treating RN: 09-01-1930 (85 y.o. Nancy Fetter Primary Care Markise Haymer: Maggie Font Other Clinician: Referring Joice Nazario: Treating Jaia Alonge/Extender:Robson, Alisia Ferrari, Charlestine Night in Treatment: 0 Primary Learner Assessed: Caregiver Niece Reason Patient is not Primary Learner: Dementia Learning Preferences/Education Level/Primary Language Learning Preference: Explanation, Demonstration, Printed Material Highest Education Level: College or Above Preferred Language: English Cognitive Barrier Language Barrier: No Translator Needed: No Memory Deficit: No Emotional Barrier: No Cultural/Religious Beliefs Affecting Medical Care: No Physical Barrier Impaired Vision: No Impaired Hearing: No Decreased Hand dexterity: No Knowledge/Comprehension Knowledge Level: High Comprehension Level: High Ability to understand written High instructions: Ability to understand verbal High instructions: Motivation Anxiety Level: Calm Cooperation: Cooperative Education Importance: Acknowledges Need Interest in Health Problems: Asks Questions Perception: Coherent Willingness to Engage in Self- High Management Activities: Readiness to Engage in Self- High Management Activities: Electronic Signature(s) Signed: 08/31/2019 5:46:53 PM By: Levan Hurst RN, BSN Entered By: Levan Hurst on 08/25/2019 16:05:56 -------------------------------------------------------------------------------- Fall Risk Assessment Details Patient Name: Date of Service: Erica Vasquez, Erica P. 08/25/2019 2:45 PM Medical Record Number:[id number] Patient Account Number: [id number] Date of Birth/Sex: Treating RN: 1930-06-06 (85 y.o. Nancy Fetter Primary Care Kiriana Worthington: Maggie Font Other Clinician: Referring Lelaina Oatis: Treating Julicia Krieger/Extender:Robson, Alisia Ferrari, Charlestine Night in Treatment: 0 Fall Risk Assessment Items Have you had 2 or more falls in the last 12 monthso 0 Yes Have you had any fall that resulted in injury in the last 12 monthso 0 Yes FALLS RISK SCREEN History of falling - immediate or within 3 months 25 Yes Secondary diagnosis (Do you have 2 or more medical diagnoseso) 15 Yes Ambulatory aid None/bed rest/wheelchair/nurse 0 No Crutches/cane/walker 15 Yes Furniture 0 No Intravenous therapy Access/Saline/Heparin Lock 0 No Weak (short steps with or without shuffle,  stooped but able to lift head 10 Yes while walking, may seek support from furniture) Impaired (short steps with shuffle, may have difficulty arising from chair, 0 No head down, impaired balance) Mental Status Oriented to own ability 0 Yes Overestimates or forgets limitations 0 No Risk Level: High Risk Score: 65 Electronic Signature(s) Signed: 08/31/2019 5:46:53 PM By: Zandra Abts RN, BSN Entered By: Zandra Abts on 08/25/2019 16:06:38 -------------------------------------------------------------------------------- Foot Assessment Details Patient Name: Date of Service: Erica Vasquez 08/25/2019 2:45 PM Medical Record Number:[id number] Patient Account Number: [id number] Date of Birth/Sex: Treating RN: [birth year] (85 y.o. Wynelle Link Primary Care Daneesha Quinteros: Evlyn Courier Other Clinician: Referring Jarian Longoria: Treating Glessie Eustice/Extender:Robson, Leavy Cella, Charolett Bumpers in Treatment: 0 Foot Assessment Items [x]  Unable to perform due to altered mental status Site Locations + = Sensation present, - = Sensation absent, C = Callus, U = Ulcer R = Redness, W = Warmth, M = Maceration, PU = Pre-ulcerative lesion F = Fissure, S = Swelling, D = Dryness Assessment Right: Left: Other Deformity: No No Prior Foot Ulcer: No  No Prior Amputation: No No Charcot Joint: No No Ambulatory Status: Ambulatory With Help Assistance Device: Wheelchair Gait: ) Signed: 08/31/2019 5:46:53 PM By: 13/06/2019 RN, BSN Entered By: Zandra Abts on 08/25/2019 16:07:02 -------------------------------------------------------------------------------- Nutrition Risk Screening Details Patient Name: Date of Service: 13/12/2018 08/25/2019 2:45 PM Medical Record Number:[id number] Patient Account Number: 13/12/2018 Date of Birth/Sex: Treating RN: Mar 22, 1930 (85 y.o. 97 Primary Care Swannie Milius: Wynelle Link Other Clinician: Referring Marlin Jarrard: Treating Zaida Reiland/Extender:Robson, Evlyn Courier, Leavy Cella in Treatment: 0 Height (in): Weight (lbs): Body Mass Index (BMI): Nutrition Risk Screening Items Score Screening NUTRITION RISK SCREEN: I have an illness or condition that made me change the kind and/or 2 Yes amount of food I eat I eat fewer than two meals per day 0 No I eat few fruits and vegetables, or milk products 0 No I have three or more drinks of beer, liquor or wine almost every day 0 No I have tooth or mouth problems that make it hard for me to eat 0 No I don't always have enough money to buy the food I need 0 No I eat alone most of the time 0 No I take three or more different prescribed or over-the-counter drugs a day 1 Yes 0 No Without wanting to, I have lost or gained 10 pounds in the last six months I am not always physically able to shop, cook and/or feed myself 2 Yes Nutrition Protocols Good Risk Protocol Provide education on Moderate Risk Protocol 0 nutrition High Risk Proctocol Risk Level: Moderate Risk Score: 5 Electronic Signature(s) Signed: 08/31/2019 5:46:53 PM By: 13/06/2019 RN, BSN Entered By: Zandra Abts on 08/25/2019 16:06:48

## 2019-09-08 ENCOUNTER — Ambulatory Visit (HOSPITAL_BASED_OUTPATIENT_CLINIC_OR_DEPARTMENT_OTHER): Payer: Medicare Other | Admitting: Internal Medicine

## 2019-09-21 ENCOUNTER — Ambulatory Visit (HOSPITAL_BASED_OUTPATIENT_CLINIC_OR_DEPARTMENT_OTHER): Payer: Medicare Other | Admitting: Internal Medicine

## 2019-09-28 ENCOUNTER — Other Ambulatory Visit: Payer: Self-pay

## 2019-09-28 ENCOUNTER — Encounter (HOSPITAL_BASED_OUTPATIENT_CLINIC_OR_DEPARTMENT_OTHER): Payer: Medicare Other | Attending: Internal Medicine | Admitting: Internal Medicine

## 2019-09-28 DIAGNOSIS — F0280 Dementia in other diseases classified elsewhere without behavioral disturbance: Secondary | ICD-10-CM | POA: Diagnosis not present

## 2019-09-28 DIAGNOSIS — Z872 Personal history of diseases of the skin and subcutaneous tissue: Secondary | ICD-10-CM | POA: Diagnosis not present

## 2019-09-28 DIAGNOSIS — Z09 Encounter for follow-up examination after completed treatment for conditions other than malignant neoplasm: Secondary | ICD-10-CM | POA: Insufficient documentation

## 2019-09-28 DIAGNOSIS — G309 Alzheimer's disease, unspecified: Secondary | ICD-10-CM | POA: Diagnosis not present

## 2019-09-29 NOTE — Progress Notes (Signed)
Erica Vasquez, Erica Vasquez ([id number]) Visit Report for 09/28/2019 HPI Details Patient Name: Date of Service: Erica Vasquez, Erica Vasquez 09/28/2019 1:45 PM Medical Record [id number] Patient Account Number: [id number] Date of Birth/Sex: Treating RN: 1930/05/25 (85 y.o. F) Primary Care Provider: Maggie Font Other Clinician: Referring Provider: Treating Provider/Extender:Robson, Alisia Ferrari, Charlestine Night in Treatment: 4 History of Present Illness HPI Description: ADMISSION 08/25/2019 This is an 85 year old woman with advanced Alzheimer's disease. She is accompanied by her niece and lives at home with her son. She apparently has had this pressure ulcer since March after hospitalization for a left hip fracture. She has been followed by [facility] after seeing her primary care provider once in April. We believe they have been using calcium alginate on this and home health is changing this twice a week. Past medical history; left hip hemiarthroplasty in February 2020 for a left hip fracture, Alzheimer's disease, malnutrition, hypertension, anemia and blindness. ABIs were not able to be obtained in our clinic secondary to restlessness 09/28/2019; patient has not been here in a month. She had a pressure ulcer on the left medial heel. They have been using silver collagen religiously offloading this with a bunny boot. Electronic Signature(s) Signed: 09/29/2019 10:01:17 AM By: Linton Ham MD Entered By: Linton Ham on 09/28/2019 15:30:08 -------------------------------------------------------------------------------- Physical Exam Details Patient Name: Date of Service: Erica Vasquez 09/28/2019 1:45 PM Medical Record [id number] Patient Account Number: [id number] Date of Birth/Sex: Treating RN: 06-08-1930 (85 y.o. F) Primary Care Provider: Maggie Font Other Clinician: Referring Provider: Treating Provider/Extender:Robson, Alisia Ferrari, Charlestine Night in Treatment:  4 Constitutional Patient is hypertensive.. Pulse regular and within target range for patient.Marland Kitchen Respirations regular, non-labored and within target range.. Temperature is normal and within the target range for the patient.Marland Kitchen Appears in no distress. Notes Wound exam; left medial heel. The area in question is completely closed. There was some dried collagen on there that I removed nothing open underneath. Electronic Signature(s) Signed: 09/29/2019 10:01:17 AM By: Linton Ham MD Entered By: Linton Ham on 09/28/2019 15:31:01 -------------------------------------------------------------------------------- Physician Orders Details Patient Name: Date of Service: Erica Vasquez 09/28/2019 1:45 PM Medical Record [id number] Patient Account Number: [id number] Date of Birth/Sex: Treating RN: 1930/06/07 (85 y.o. Erica Vasquez Primary Care Provider: Maggie Font Other Clinician: Referring Provider: Treating Provider/Extender:Robson, Alisia Ferrari, Charlestine Night in Treatment: 4 Verbal / Phone Orders: No Diagnosis Coding ICD-10 Coding Code Description L89.620 Pressure ulcer of left heel, unstageable G30.8 Other Alzheimer's disease Discharge From [facility] Services Discharge from [place] lotion - both legs and feet daily Off-Loading Turn and reposition every 2 hours [place] - wound healed Electronic Signature(s) Signed: 09/28/2019 5:51:44 PM By: Levan Hurst RN, BSN Signed: 09/29/2019 10:01:17 AM By: Linton Ham MD Entered By: Levan Hurst on 09/28/2019 15:15:49 -------------------------------------------------------------------------------- Problem List Details Patient Name: Date of Service: Erica Vasquez 09/28/2019 1:45 PM Medical Record [id number] Patient Account Number: [id number] Date of Birth/Sex: Treating RN: 1930/10/06 (85 y.o. Erica Vasquez Primary  Care Provider: Maggie Font Other Clinician: Referring Provider: Treating Provider/Extender:Robson, Alisia Ferrari, Charlestine Night in Treatment: 4 Active Problems ICD-10 Evaluated Encounter Code Description Active Date Today Diagnosis L89.620 Pressure ulcer of left heel, unstageable 08/25/2019 No Yes G30.8 Other Alzheimer's disease 08/25/2019 No Yes Inactive Problems Resolved Problems Electronic Signature(s) Signed: 09/29/2019 10:01:17 AM By: Linton Ham MD Entered By: Linton Ham on 09/28/2019 15:28:51 -------------------------------------------------------------------------------- Progress Note Details Patient Name: Date of  Service: Erica Vasquez, Erica Vasquez 09/28/2019 1:45 PM Medical Record Number:[id number] Patient Account Number: [id number] Date of Birth/Sex: Treating RN: 03-08-1930 (85 y.o. F) Primary Care Provider: Evlyn Courier Other Clinician: Referring Provider: Treating Provider/Extender:Robson, Leavy Cella, Charolett Bumpers in Treatment: 4 Subjective History of Present Illness (HPI) ADMISSION 08/25/2019 This is an 85 year old woman with advanced Alzheimer's disease. She is accompanied by her niece and lives at home with her son. She apparently has had this pressure ulcer since March after hospitalization for a left hip fracture. She has been followed by [facility] after seeing her primary care provider once in April. We believe they have been using calcium alginate on this and home health is changing this twice a week. Past medical history; left hip hemiarthroplasty in February 2020 for a left hip fracture, Alzheimer's disease, malnutrition, hypertension, anemia and blindness. ABIs were not able to be obtained in our clinic secondary to restlessness 09/28/2019; patient has not been here in a month. She had a pressure ulcer on the left medial heel. They have been using silver collagen religiously offloading this with a bunny  boot. Objective Constitutional Patient is hypertensive.. Pulse regular and within target range for patient.Marland Kitchen Respirations regular, non-labored and within target range.. Temperature is normal and within the target range for the patient.Marland Kitchen Appears in no distress. Vitals Time Taken: 2:14 PM, Height: 59 in, Source: Stated, Source: Stated, Pulse: 80 bpm, Respiratory Rate: 20 breaths/min, Blood Pressure: 152/105 mmHg. General Notes: Wound exam; left medial heel. The area in question is completely closed. There was some dried collagen on there that I removed nothing open underneath. Integumentary (Hair, Skin) Wound #1 status is Healed - Epithelialized. Original cause of wound was Pressure Injury. The wound is located on the Left,Medial Calcaneus. The wound measures 0cm length x 0cm width x 0cm depth; 0cm^2 area and 0cm^3 volume. There is no tunneling or undermining noted. There is a none present amount of drainage noted. The wound margin is thickened. There is no granulation within the wound bed. There is no necrotic tissue within the wound bed. Assessment Active Problems ICD-10 Pressure ulcer of left heel, unstageable Other Alzheimer's disease Plan Discharge From [facility] Services: Discharge from [facility] Center Skin Barriers/Peri-Wound Care: Moisturizing lotion - both legs and feet daily Off-Loading: Turn and reposition every 2 hours Home Health: Discontinue home health - [facility] - wound healed 1. The patient can be discharged 2. I have told him to maintain using the bunny boots at night they expressed understanding. 3. Home health can be discharged Electronic Signature(s) Signed: 09/29/2019 10:01:17 AM By: Baltazar Najjar MD Entered By: Baltazar Najjar on 09/28/2019 15:31:35 -------------------------------------------------------------------------------- SuperBill Details Patient Name: Date of Service: Erica Vasquez 09/28/2019 Medical Record Number:[id number] Patient Account Number:  [id number] Date of Birth/Sex: Treating RN: April 09, 1930 (85 y.o. F) Primary Care Provider: Evlyn Courier Other Clinician: Referring Provider: Treating Provider/Extender:Robson, Leavy Cella, Charolett Bumpers in Treatment: 4 Diagnosis Coding ICD-10 Codes Code Description [phone] Pressure ulcer of left heel, unstageable G30.8 Other Alzheimer's disease Facility Procedures CPT4 Code: [id number] Description: 99213 - WOUND CARE VISIT-LEV 3 EST PT Modifier: Quantity: 1 Physician Procedures CPT4 Code: [id number] Description: [phone] - WC PHYS LEVEL 2 - EST PT ICD-10 Diagnosis Description L89.620 Pressure ulcer of left heel, unstageable G30.8 Other Alzheimer's disease Modifier: Quantity: 1 Electronic Signature(s) Signed: 09/28/2019 5:51:44 PM By: Zandra Abts RN, BSN Signed: 09/29/2019 10:01:17 AM By: Baltazar Najjar MD Entered By: Zandra Abts on 09/28/2019 17:37:40

## 2019-09-29 NOTE — Progress Notes (Signed)
Erica Vasquez, Erica P. ([id number]) Visit Report for 09/28/2019 Arrival Information Details Patient Name: Date of Service: Erica Vasquez, [name] 09/28/2019 1:45 PM Medical Record Number:[id number] Patient Account Number: [id number] Date of Birth/Sex: Treating RN: 11/16/1929 (85 y.o. Debara PickettF) Deaton, Millard.LoaBobbi Primary Care Murel Wigle: Evlyn CourierHill, [name] K Other Clinician: Referring Rowena Moilanen: Treating Chrisopher Pustejovsky/Extender:Robson, Leavy CellaMichael Hill, Charolett BumpersGerald K Weeks in Treatment: 4 Visit Information History Since Last Visit Added or deleted any medications: No Patient Arrived: Wheel Chair Any new allergies or adverse reactions: No Arrival Time: 14:14 Had a fall or experienced change in No Accompanied By: niece activities of daily living that may affect Transfer Assistance: None risk of falls: Patient Identification Verified: Yes Signs or symptoms of abuse/neglect since last No Secondary Verification Process Yes visito Completed: Hospitalized since last visit: No Patient Requires Transmission-Based No Implantable device outside of the clinic excluding No Precautions: cellular tissue based products placed in the center Patient Has Alerts: Yes unable to obtain since last visit: Patient Alerts: Has Dressing in Place as Prescribed: Yes ABI Pain Present Now: No Electronic Signature(s) Signed: 09/28/2019 5:20:01 PM By: Shawn Stalleaton, Bobbi Entered By: Shawn Stalleaton, Bobbi on 09/28/2019 14:14:27 -------------------------------------------------------------------------------- Clinic Level of Care Assessment Details Patient Name: Date of Service: Erica Vasquez, Erica P. 09/28/2019 1:45 PM Medical Record Number:[id number] Patient Account Number: [id number] Date of Birth/Sex: Treating RN: 11/16/1929 (85 y.o. Erica Vasquez) Lynch, Shatara Primary Care Lalania Haseman: Evlyn CourierHill, [name] K Other Clinician: Referring Brysun Eschmann: Treating Meilech Virts/Extender:Robson, Leavy CellaMichael Hill, Charolett BumpersGerald K Weeks in Treatment: 4 Clinic Level of Care Assessment Items TOOL 4 Quantity  Score X - Use when only an EandM is performed on FOLLOW-UP visit 1 0 ASSESSMENTS - Nursing Assessment / Reassessment X - Reassessment of Co-morbidities (includes updates in patient status) 1 10 X - Reassessment of Adherence to Treatment Plan 1 5 ASSESSMENTS - Wound and Skin Assessment / Reassessment X - Simple Wound Assessment / Reassessment - one wound 1 5 []  - Complex Wound Assessment / Reassessment - multiple wounds 0 []  - Dermatologic / Skin Assessment (not related to wound area) 0 ASSESSMENTS - Focused Assessment []  - Circumferential Edema Measurements - multi extremities 0 []  - Nutritional Assessment / Counseling / Intervention 0 X - Lower Extremity Assessment (monofilament, tuning fork, pulses) 1 5 []  - Peripheral Arterial Disease Assessment (using hand held doppler) 0 ASSESSMENTS - Ostomy and/or Continence Assessment and Care []  - Incontinence Assessment and Management 0 []  - Ostomy Care Assessment and Management (repouching, etc.) 0 PROCESS - Coordination of Care X - Simple Patient / Family Education for ongoing care 1 15 []  - Complex (extensive) Patient / Family Education for ongoing care 0 X - Staff obtains ChiropractorConsents, Records, Test Results / Process Orders 1 10 X - Staff telephones HHA, Nursing Homes / Clarify orders / etc 1 10 []  - Routine Transfer to another Facility (non-emergent condition) 0 []  - Routine Hospital Admission (non-emergent condition) 0 []  - New Admissions / Manufacturing engineernsurance Authorizations / Ordering NPWT, Apligraf, etc. 0 []  - Emergency Hospital Admission (emergent condition) 0 X - Simple Discharge Coordination 1 10 []  - Complex (extensive) Discharge Coordination 0 PROCESS - Special Needs []  - Pediatric / Minor Patient Management 0 []  - Isolation Patient Management 0 []  - Hearing / Language / Visual special needs 0 []  - Assessment of Community assistance (transportation, D/C planning, etc.) 0 []  - Additional assistance / Altered mentation 0 []  - Support  Surface(s) Assessment (bed, cushion, seat, etc.) 0 INTERVENTIONS - Wound Cleansing / Measurement X - Simple Wound Cleansing - one wound 1 5 []  -  Complex Wound Cleansing - multiple wounds 0 X - Wound Imaging (photographs - any number of wounds) 1 5 []  - Wound Tracing (instead of photographs) 0 X - Simple Wound Measurement - one wound 1 5 []  - Complex Wound Measurement - multiple wounds 0 INTERVENTIONS - Wound Dressings []  - Small Wound Dressing one or multiple wounds 0 []  - Medium Wound Dressing one or multiple wounds 0 []  - Large Wound Dressing one or multiple wounds 0 []  - Application of Medications - topical 0 []  - Application of Medications - injection 0 INTERVENTIONS - Miscellaneous []  - External ear exam 0 []  - Specimen Collection (cultures, biopsies, blood, body fluids, etc.) 0 []  - Specimen(s) / Culture(s) sent or taken to Lab for analysis 0 []  - Patient Transfer (multiple staff / Civil Service fast streamer / Similar devices) 0 []  - Simple Staple / Suture removal (25 or less) 0 []  - Complex Staple / Suture removal (26 or more) 0 []  - Hypo / Hyperglycemic Management (close monitor of Blood Glucose) 0 []  - Ankle / Brachial Index (ABI) - do not check if billed separately 0 X - Vital Signs 1 5 Has the patient been seen at the hospital within the last three years: Yes Total Score: 90 Level Of Care: New/Established - Level 3 Electronic Signature(s) Signed: 09/28/2019 5:51:44 PM By: Levan Hurst RN, BSN Entered By: Levan Hurst on 09/28/2019 17:37:30 -------------------------------------------------------------------------------- Encounter Discharge Information Details Patient Name: Date of Service: Erica Vasquez 09/28/2019 1:45 PM Medical Record Number:[id number] Patient Account Number: [id number] Date of Birth/Sex: Treating RN: [birth year] (85 y.o. Nancy Fetter Primary Care Haileyann Staiger: Maggie Font Other Clinician: Referring Vicie Cech: Treating Roanne Haye/Extender:Robson,  Alisia Ferrari, Charlestine Night in Treatment: 4 Encounter Discharge Information Items Discharge Condition: Stable Ambulatory Status: Wheelchair Discharge Destination: Home Transportation: Private Auto Accompanied By: caregiver Schedule Follow-up Appointment: Yes Clinical Summary of Care: Patient Declined Electronic Signature(s) Signed: 09/28/2019 5:51:44 PM By: Levan Hurst RN, BSN Entered By: Levan Hurst on 09/28/2019 17:38:00 -------------------------------------------------------------------------------- Lower Extremity Assessment Details Patient Name: Date of Service: Erica Vasquez 09/28/2019 1:45 PM Medical Record Number:[id number] Patient Account Number: [id number] Date of Birth/Sex: Treating RN: [birth year] (85 y.o. Debby Bud Primary Care Lakendra Helling: Maggie Font Other Clinician: Referring Tarus Briski: Treating Deshanda Molitor/Extender:Robson, Alisia Ferrari, Charlestine Night in Treatment: 4 Edema Assessment Assessed: [Left: Yes] [Right: No] Edema: [Left: Ye] [Right: s] Calf Left: Right: Point of Measurement: 26 cm From Medial Instep 28 cm cm Ankle Left: Right: Point of Measurement: 8 cm From Medial Instep 18.6 cm cm Electronic Signature(s) Signed: 09/28/2019 5:20:01 PM By: Deon Pilling Entered By: Deon Pilling on 09/28/2019 14:18:10 -------------------------------------------------------------------------------- Multi Wound Chart Details Patient Name: Date of Service: Erica Vasquez 09/28/2019 1:45 PM Medical Record [id number] Patient Account Number: [id number] Date of Birth/Sex: Treating RN: 03-06-1930 (85 y.o. F) Primary Care Edris Schneck: Maggie Font Other Clinician: Referring Taelynn Mcelhannon: Treating Edwin Baines/Extender:Robson, Alisia Ferrari, Charlestine Night in Treatment: 4 Vital Signs Height(in): 59 Pulse(bpm): 80 Weight(lbs): Blood Pressure(mmHg): 152/105 Body Mass Index(BMI): Temperature(F): Respiratory 20 Rate(breaths/min): Photos: [1:No Photos]  [N/A:N/A] Wound Location: [1:Left Calcaneus - Medial] [N/A:N/A] Wounding Event: [1:Pressure Injury] [N/A:N/A] Primary Etiology: [1:Pressure Ulcer] [N/A:N/A] Comorbid History: [1:Cataracts, Glaucoma, Hypertension, Osteoarthritis, Dementia] [N/A:N/A] Date Acquired: [1:12/21/2018] [N/A:N/A] Weeks of Treatment: [1:4] [N/A:N/A] Wound Status: [1:Healed - Epithelialized] [N/A:N/A] Measurements L x W x D 0x0x0 [N/A:N/A] (cm) Area (cm) : [1:0] [N/A:N/A] Volume (cm) : [1:0] [N/A:N/A] % Reduction in Area: [1:100.00%] [N/A:N/A] % Reduction in Volume: 100.00% [N/A:N/A] Classification: [1:Unstageable/Unclassified] [N/A:N/A] Exudate  Amount: [1:None Present] [N/A:N/A] Wound Margin: [1:Thickened] [N/A:N/A] Granulation Amount: [1:None Present (0%)] [N/A:N/A] Necrotic Amount: [1:None Present (0%)] [N/A:N/A] Exposed Structures: [1:Fascia: No Fat Layer (Subcutaneous Tissue) Exposed: No Tendon: No Muscle: No Joint: No Bone: No Large (67-100%)] [N/A:N/A N/A] Treatment Notes Electronic Signature(s) Signed: 09/29/2019 10:01:17 AM By: Baltazar Najjar MD Entered By: Baltazar Najjar on 09/28/2019 15:29:02 -------------------------------------------------------------------------------- Multi-Disciplinary Care Plan Details Patient Name: Date of Service: Erica Carol. 09/28/2019 1:45 PM Medical Record Number:[id number] Patient Account Number: [id number] Date of Birth/Sex: Treating RN: [birth year] (85 y.o. Erica Link Primary Care Doris Gruhn: Evlyn Courier Other Clinician: Referring Shameer Molstad: Treating Meliza Kage/Extender:Robson, Leavy Cella, Charolett Bumpers in Treatment: 4 Active Inactive Electronic Signature(s) Signed: 09/28/2019 5:51:44 PM By: Zandra Abts RN, BSN Entered By: Zandra Abts on 09/28/2019 17:36:56 -------------------------------------------------------------------------------- Pain Assessment Details Patient Name: Date of Service: Erica Carol 09/28/2019 1:45 PM Medical  Record Number:[id number] Patient Account Number: [id number] Date of Birth/Sex: Treating RN: [birth year] (85 y.o. Arta Silence Primary Care Nik Gorrell: Evlyn Courier Other Clinician: Referring Myrene Bougher: Treating Lanyia Jewel/Extender:Robson, Leavy Cella, Charolett Bumpers in Treatment: 4 Active Problems Location of Pain Severity and Description of Pain Patient Has Paino No Site Locations Rate the pain. Current Pain Level: 0 Pain Management and Medication Current Pain Management: Medication: No Cold Application: No Rest: No Massage: No Activity: No T.E.N.S.: No Heat Application: No Leg drop or elevation: No Is the Current Pain Management Adequate: Adequate How does your wound impact your activities of daily livingo Sleep: No Bathing: No Appetite: No Relationship With Others: No Bladder Continence: No Emotions: No Bowel Continence: No Work: No Toileting: No Drive: No Dressing: No Hobbies: No Electronic Signature(s) Signed: 09/28/2019 5:20:01 PM By: Shawn Stall Entered By: Shawn Stall on 09/28/2019 14:16:29 -------------------------------------------------------------------------------- Patient/Caregiver Education Details Patient Name: Date of Service: Erica Carol 12/7/2020andnbsp1:45 PM Medical Record Number:[id number] Patient Account Number: [id number] Date of Birth/Gender: Treating RN: 1930/07/16 (85 y.o. Erica Link Primary Care Physician: Evlyn Courier Other Clinician: Referring Physician: Treating Physician/Extender:Robson, Leavy Cella, Charolett Bumpers in Treatment: 4 Education Assessment Education Provided To: Patient Education Topics Provided Wound/Skin Impairment: Methods: Explain/Verbal Responses: State content correctly Electronic Signature(s) Signed: 09/28/2019 5:51:44 PM By: Zandra Abts RN, BSN Entered By: Zandra Abts on 09/28/2019 17:37:04 -------------------------------------------------------------------------------- Wound  Assessment Details Patient Name: Date of Service: Erica Carol 09/28/2019 1:45 PM Medical Record Number:[id number] Patient Account Number: [id number] Date of Birth/Sex: Treating RN: December 24, 1929 (85 y.o. Debara Pickett, Millard.Loa Primary Care Denette Hass: Evlyn Courier Other Clinician: Referring Briannon Boggio: Treating Anai Lipson/Extender:Robson, Leavy Cella, Charolett Bumpers in Treatment: 4 Wound Status Wound Number: 1 Primary Pressure Ulcer Etiology: Wound Location: Left Calcaneus - Medial Wound Healed - Epithelialized Wounding Event: Pressure Injury Status: Date Acquired: 12/21/2018 Comorbid Cataracts, Glaucoma, Hypertension, Weeks Of Treatment: 4 History: Osteoarthritis, Dementia Clustered Wound: No Wound Measurements Length: (cm) 0 Width: (cm) 0 Depth: (cm) 0 Area: (cm) 0 Volume: (cm) 0 Wound Description Classification: Unstageable/Unclassified Wound Margin: Thickened Exudate Amount: None Present Wound Bed Granulation Amount: None Present (0%) Necrotic Amount: None Present (0%) or After Cleansing: No Fibrino No Exposed Structure sed: No Subcutaneous Tissue) Exposed: No sed: No sed: No ed: No d: No % Reduction in Area: 100% % Reduction in Volume: 100% Epithelialization: Large (67-100%) Tunneling: No Undermining: No Foul Od Slough/ Fascia Expo Fat Layer ( Tendon Expo Muscle Expo Joint Expos Bone Expose Electronic Signature(s) Signed: 09/28/2019 5:20:01 PM By: Shawn Stall Signed: 09/28/2019 5:51:44 PM By: Zandra Abts RN, BSN Entered By: Zandra Abts  on 09/28/2019 15:12:50 -------------------------------------------------------------------------------- Vitals Details Patient Name: Date of Service: KARRIS, DEANGELO 09/28/2019 1:45 PM Medical Record Number:[id number] Patient Account Number: [id number] Date of Birth/Sex: Treating RN: 1929/12/25 (85 y.o. Arta Silence Primary Care Raef Sprigg: Evlyn Courier Other Clinician: Referring Tige Meas: Treating  Wavie Hashimi/Extender:Robson, Leavy Cella, Charolett Bumpers in Treatment: 4 Vital Signs Time Taken: 14:14 Pulse (bpm): 80 Height (in): 59 Respiratory Rate (breaths/min): 20 Source: Stated Blood Pressure (mmHg): 152/105 Source: Stated Reference Range: 80 - 120 mg / dl Electronic Signature(s) Signed: 09/28/2019 5:20:01 PM By: Shawn Stall Entered By: Shawn Stall on 09/28/2019 14:16:18

## 2019-09-30 NOTE — Progress Notes (Signed)
Erica Vasquez, Erica P. ([id number]) Visit Report for 08/25/2019 Allergy List Details Patient Name: Date of Service: Erica Vasquez, Erica P. 08/25/2019 2:45 PM Medical Record Number:[id number] Patient Account Number: [id number] Date of Birth/Sex: Treating RN: 1930-10-07 (85 y.o. Wynelle LinkF) Lynch, Shatara Primary Care Maanav Kassabian: Evlyn CourierHill, [name] K Other Clinician: Referring Naser Schuld: Treating Coriana Angello/Extender:Robson, Leavy CellaMichael Hill, Charolett BumpersGerald K Weeks in Treatment: 0 Allergies Active Allergies bimatoprost Reaction: itching Severity: Moderate brimonidine Reaction: itching Severity: Moderate timolol Reaction: itching Severity: Moderate Allergy Notes Electronic Signature(s) Signed: 08/31/2019 5:46:53 PM By: Zandra AbtsLynch, Shatara RN, BSN Entered By: Zandra AbtsLynch, Shatara on 08/25/2019 15:50:48 -------------------------------------------------------------------------------- Arrival Information Details Patient Name: Date of Service: Erica Vasquez, Erica P. 08/25/2019 2:45 PM Medical Record Number:[id number] Patient Account Number: [id number] Date of Birth/Sex: Treating RN: 1930-10-07 (85 y.o. Wynelle LinkF) Lynch, Shatara Primary Care Xochil Shanker: Evlyn CourierHill, [name] K Other Clinician: Referring Auron Tadros: Treating Zarius Furr/Extender:Robson, Leavy CellaMichael Hill, Charolett BumpersGerald K Weeks in Treatment: 0 Visit Information Patient Arrived: Wheel Chair Arrival Time: 15:44 Accompanied By: niece Transfer Assistance: Manual Patient Identification Verified: Yes Secondary Verification Process Yes Completed: Patient Requires Transmission-Based No Precautions: Patient Has Alerts: Yes Patient Alerts: unable to obtain ABI Electronic Signature(s) Signed: 08/31/2019 5:46:53 PM By: Zandra AbtsLynch, Shatara RN, BSN Entered By: Zandra AbtsLynch, Shatara on 08/25/2019 16:15:24 -------------------------------------------------------------------------------- Clinic Level of Care Assessment Details Patient Name: Date of Service: Erica Vasquez, Erica P. 08/25/2019 2:45 PM Medical Record  Number:[id number] Patient Account Number: [id number] Date of Birth/Sex: Treating RN: 1930-10-07 (85 y.o. Freddy FinnerF) Epps, Carrie Primary Care Cerys Winget: Evlyn CourierHill, [name] K Other Clinician: Referring Merton Wadlow: Treating Yazleemar Strassner/Extender:Robson, Leavy CellaMichael Hill, Charolett BumpersGerald K Weeks in Treatment: 0 Clinic Level of Care Assessment Items TOOL 1 Quantity Score X - Use when EandM and Procedure is performed on INITIAL visit 1 0 ASSESSMENTS - Nursing Assessment / Reassessment X - General Physical Exam (combine w/ comprehensive assessment (listed just below) 1 20 when performed on new pt. evals) X - Comprehensive Assessment (HX, ROS, Risk Assessments, Wounds Hx, etc.) 1 25 ASSESSMENTS - Wound and Skin Assessment / Reassessment []  - Dermatologic / Skin Assessment (not related to wound area) 0 ASSESSMENTS - Ostomy and/or Continence Assessment and Care []  - Incontinence Assessment and Management 0 []  - Ostomy Care Assessment and Management (repouching, etc.) 0 PROCESS - Coordination of Care X - Simple Patient / Family Education for ongoing care 1 15 []  - Complex (extensive) Patient / Family Education for ongoing care 0 X - Staff obtains ChiropractorConsents, Records, Test Results / Process Orders 1 10 []  - Staff telephones HHA, Nursing Homes / Clarify orders / etc 0 []  - Routine Transfer to another Facility (non-emergent condition) 0 []  - Routine Hospital Admission (non-emergent condition) 0 X - New Admissions / Manufacturing engineernsurance Authorizations / Ordering NPWT, Apligraf, etc. 1 15 []  - Emergency Hospital Admission (emergent condition) 0 PROCESS - Special Needs []  - Pediatric / Minor Patient Management 0 []  - Isolation Patient Management 0 []  - Hearing / Language / Visual special needs 0 []  - Assessment of Community assistance (transportation, D/C planning, etc.) 0 []  - Additional assistance / Altered mentation 0 []  - Support Surface(s) Assessment (bed, cushion, seat, etc.) 0 INTERVENTIONS - Miscellaneous []  - External ear exam 0 []   - Patient Transfer (multiple staff / Nurse, adultHoyer Lift / Similar devices) 0 []  - Simple Staple / Suture removal (25 or less) 0 []  - Complex Staple / Suture removal (26 or more) 0 []  - Hypo/Hyperglycemic Management (do not check if billed separately) 0 X - Ankle / Brachial Index (ABI) - do not check if billed separately 1 15 Has the patient  been seen at the hospital within the last three years: Yes Total Score: 100 Level Of Care: New/Established - Level 3 Electronic Signature(s) Signed: 09/30/2019 12:05:18 PM By: Yevonne Pax RN Entered By: Yevonne Pax on 08/25/2019 16:28:45 -------------------------------------------------------------------------------- Encounter Discharge Information Details Patient Name: Date of Service: Erica Vasquez 08/25/2019 2:45 PM Medical Record Number:[id number] Patient Account Number: [id number] Date of Birth/Sex: Treating RN: 1930/07/04 (85 y.o. Wynelle Link Primary Care Marcos Ruelas: Evlyn Courier Other Clinician: Referring Jakaya Jacobowitz: Treating Niaya Hickok/Extender:Robson, Leavy Cella, Charolett Bumpers in Treatment: 0 Encounter Discharge Information Items Post Procedure Vitals Discharge Condition: Stable Temperature (F): 98.0 Ambulatory Status: Wheelchair Pulse (bpm): 56 Discharge Destination: Home Respiratory Rate (breaths/min): 18 Transportation: Private Auto Blood Pressure (mmHg): 171/73 Accompanied By: niece Schedule Follow-up Appointment: Yes Clinical Summary of Care: Patient Declined Electronic Signature(s) Signed: 08/31/2019 5:46:53 PM By: Zandra Abts RN, BSN Entered By: Zandra Abts on 08/25/2019 17:38:26 -------------------------------------------------------------------------------- Lower Extremity Assessment Details Patient Name: Date of Service: Erica Vasquez 08/25/2019 2:45 PM Medical Record Number:[id number] Patient Account Number: [id number] Date of Birth/Sex: Treating RN: 02/29/1930 (85 y.o. Wynelle Link Primary Care  Jawad Wiacek: Evlyn Courier Other Clinician: Referring Etta Gassett: Treating Areanna Gengler/Extender:Robson, Leavy Cella, Charolett Bumpers in Treatment: 0 Edema Assessment Assessed: [Left: No] [Right: No] Edema: [Left: Ye] [Right: s] Calf Left: Right: Point of Measurement: 26 cm From Medial Instep 28 cm cm Ankle Left: Right: Point of Measurement: 8 cm From Medial Instep 18.6 cm cm Vascular Assessment Pulses: Dorsalis Pedis Palpable: [Left:Yes] Electronic Signature(s) Signed: 08/31/2019 5:46:53 PM By: Zandra Abts RN, BSN Entered By: Zandra Abts on 08/25/2019 16:07:31 -------------------------------------------------------------------------------- Multi Wound Chart Details Patient Name: Date of Service: Erica Vasquez 08/25/2019 2:45 PM Medical Record Number:[id number] Patient Account Number: [id number] Date of Birth/Sex: Treating RN: 1929-12-16 (85 y.o. Freddy Finner Primary Care Jya Hughston: Evlyn Courier Other Clinician: Referring Revan Gendron: Treating Arafat Cocuzza/Extender:Robson, Leavy Cella, Charolett Bumpers in Treatment: 0 Vital Signs Height(in): Pulse(bpm): 56 Weight(lbs): Blood Pressure(mmHg): 171/73 Body Mass Index(BMI): Temperature(F): 98.0 Respiratory 18 Rate(breaths/min): Photos: [1:No Photos] [N/A:N/A] Wound Location: [1:Left Calcaneus - Medial] [N/A:N/A] Wounding Event: [1:Pressure Injury] [N/A:N/A] Primary Etiology: [1:Pressure Ulcer] [N/A:N/A] Comorbid History: [1:Cataracts, Glaucoma, Hypertension, Osteoarthritis, Dementia] [N/A:N/A] Date Acquired: [1:12/21/2018] [N/A:N/A] Weeks of Treatment: [1:0] [N/A:N/A] Wound Status: [1:Open] [N/A:N/A] Measurements L x W x D 1x0.5x0.1 [N/A:N/A] (cm) Area (cm) : [1:0.393] [N/A:N/A] Volume (cm) : [1:0.039] [N/A:N/A] Classification: [1:Unstageable/Unclassified] [N/A:N/A] Exudate Amount: [1:Small] [N/A:N/A] Exudate Type: [1:Serosanguineous] [N/A:N/A] Exudate Color: [1:red, brown] [N/A:N/A] Wound Margin: [1:Thickened]  [N/A:N/A] Granulation Amount: [1:None Present (0%)] [N/A:N/A] Necrotic Amount: [1:Large (67-100%)] [N/A:N/A] Necrotic Tissue: [1:Eschar] [N/A:N/A] Exposed Structures: [1:Fascia: No Fat Layer (Subcutaneous Tissue) Exposed: No Tendon: No Muscle: No Joint: No Bone: No] [N/A:N/A] Epithelialization: [1:None] [N/A:N/A] Debridement: [1:Debridement - Excisional] [N/A:N/A] Pre-procedure [1:16:17] [N/A:N/A] Verification/Time Out Taken: Pain Control: [1:Lidocaine 5% topical ointment] [N/A:N/A] Tissue Debrided: [1:Callus, Subcutaneous, Slough] [N/A:N/A] Level: [1:Skin/Subcutaneous Tissue] [N/A:N/A] Debridement Area (sq cm):0.5 [N/A:N/A] Instrument: [1:Curette] [N/A:N/A] Bleeding: [1:Minimum] [N/A:N/A] Hemostasis Achieved: [1:Pressure] [N/A:N/A] Procedural Pain: [1:0] [N/A:N/A] Post Procedural Pain: [1:0] [N/A:N/A] Debridement Treatment Procedure was tolerated [N/A:N/A] Response: [1:well] Post Debridement [1:1x0.5x0.1] [N/A:N/A] Measurements L x W x D (cm) Post Debridement [1:0.039] [N/A:N/A] Volume: (cm) Post Debridement Stage: Unstageable/Unclassified [N/A:N/A N/A] Treatment Notes Electronic Signature(s) Signed: 08/25/2019 6:03:12 PM By: Baltazar Najjar MD Signed: 09/30/2019 12:05:18 PM By: Yevonne Pax RN Entered By: Baltazar Najjar on 08/25/2019 16:47:54 -------------------------------------------------------------------------------- Multi-Disciplinary Care Plan Details Patient Name: Date of Service: Erica Vasquez 08/25/2019 2:45 PM Medical Record Number:[id number] Patient Account Number: [id number] Date  of Birth/Sex: Treating RN: 08-Feb-1930 (85 y.o. Orvan Falconer Primary Care Darrius Montano: Maggie Font Other Clinician: Referring Imaya Duffy: Treating Martavion Couper/Extender:Robson, Alisia Ferrari, Charlestine Night in Treatment: 0 Active Inactive Wound/Skin Impairment Nursing Diagnoses: Knowledge deficit related to ulceration/compromised skin integrity Goals: Patient/caregiver will  verbalize understanding of skin care regimen Date Initiated: 08/25/2019 Target Resolution Date: 09/18/2019 Goal Status: Active Ulcer/skin breakdown will have a volume reduction of 30% by week 4 Date Initiated: 08/25/2019 Target Resolution Date: 09/18/2019 Goal Status: Active Interventions: Assess patient/caregiver ability to obtain necessary supplies Assess patient/caregiver ability to perform ulcer/skin care regimen upon admission and as needed Assess ulceration(s) every visit Notes: Electronic Signature(s) Signed: 09/30/2019 12:05:18 PM By: Carlene Coria RN Entered By: Carlene Coria on 08/25/2019 16:27:55 -------------------------------------------------------------------------------- Pain Assessment Details Patient Name: Date of Service: Erica Vasquez, Erica Vasquez 08/25/2019 2:45 PM Medical Record [id number] Patient Account Number: [id number] Date of Birth/Sex: Treating RN: Oct 11, 1930 (85 y.o. Nancy Fetter Primary Care Brnadon Eoff: Maggie Font Other Clinician: Referring Onell Mcmath: Treating Yarelin Reichardt/Extender:Robson, Alisia Ferrari, Charlestine Night in Treatment: 0 Active Problems Location of Pain Severity and Description of Pain Patient Has Paino No Site Locations Pain Management and Medication Current Pain Management: Electronic Signature(s) Signed: 08/31/2019 5:46:53 PM By: Levan Hurst RN, BSN Entered By: Levan Hurst on 08/25/2019 16:09:17 -------------------------------------------------------------------------------- Patient/Caregiver Education Details Patient Name: Date of Service: Erica Vasquez 11/3/2020andnbsp2:45 PM Medical Record Number:[id number] Patient Account Number: [id number] Date of Birth/Gender: Treating RN: 1930/10/04 (85 y.o. Orvan Falconer Primary Care Physician: Maggie Font Other Clinician: Referring Physician: Treating Physician/Extender:Robson, Alisia Ferrari, Charlestine Night in Treatment: 0 Education Assessment Education Provided  To: Patient Education Topics Provided Wound/Skin Impairment: Methods: Explain/Verbal Responses: State content correctly Electronic Signature(s) Signed: 09/30/2019 12:05:18 PM By: Carlene Coria RN Entered By: Carlene Coria on 08/25/2019 16:28:04 -------------------------------------------------------------------------------- Wound Assessment Details Patient Name: Date of Service: Erica Vasquez 08/25/2019 2:45 PM Medical Record [id number] Patient Account Number: [id number] Date of Birth/Sex: Treating RN: [birth year] (85 y.o. Nancy Fetter Primary Care Ruble Buttler: Maggie Font Other Clinician: Referring Rakim Moone: Treating Hadessah Grennan/Extender:Robson, Alisia Ferrari, Charlestine Night in Treatment: 0 Wound Status Wound Number: 1 Primary Pressure Ulcer Etiology: Wound Location: Left Calcaneus - Medial Wound Open Wounding Event: Pressure Injury Status: Date Acquired: 12/21/2018 Comorbid Cataracts, Glaucoma, Hypertension, Weeks Of Treatment: 0 History: Osteoarthritis, Dementia Clustered Wound: No Photos Wound Measurements Length: (cm) 1 Width: (cm) 0.5 Depth: (cm) 0.1 Area: (cm) 0.393 Volume: (cm) 0.039 Wound Description Classification: Unstageable/Unclassified Wound Margin: Thickened Exudate Amount: Small Exudate Type: Serosanguineous Exudate Color: red, brown Wound Bed Granulation Amount: None Present (0%) Necrotic Amount: Large (67-100%) Necrotic Quality: Eschar or After Cleansing: No Fibrino No Exposed Structure sed: No Subcutaneous Tissue) Exposed: No sed: No sed: No ed: No d: No % Reduction in Area: 0% % Reduction in Volume: 0% Epithelialization: None Tunneling: No Undermining: No Foul Od Slough/ Fascia Expo Fat Layer ( Tendon Expo Muscle Expo Joint Expos Bone Expose Electronic Signature(s) Signed: 08/31/2019 4:06:15 PM By: Mikeal Hawthorne EMT/HBOT Signed: 08/31/2019 5:46:53 PM By: Levan Hurst RN, BSN Entered By: Mikeal Hawthorne on  08/31/2019 11:08:30 -------------------------------------------------------------------------------- Vitals Details Patient Name: Date of Service: Erica Vasquez 08/25/2019 2:45 PM Medical Record Number:[id number] Patient Account Number: [id number] Date of Birth/Sex: Treating RN: 1930/03/23 (85 y.o. Nancy Fetter Primary Care Gordy Goar: Maggie Font Other Clinician: Referring Sallee Hogrefe: Treating Erron Wengert/Extender:Robson, Alisia Ferrari, Charlestine Night in Treatment: 0 Vital Signs Time Taken: 15:48 Temperature (F): 98.0 Pulse (bpm): 56 Respiratory Rate (breaths/min): 18 Blood Pressure (  mmHg): 171/73 Reference Range: 80 - 120 mg / dl Electronic Signature(s) Signed: 08/31/2019 5:46:53 PM By: Zandra Abts RN, BSN Entered By: Zandra Abts on 08/25/2019 15:49:33

## 2019-09-30 NOTE — Progress Notes (Signed)
Erica Vasquez, Erica Vasquez ([id number]) Visit Report for 08/25/2019 Chief Complaint Document Details Patient Name: Date of Service: Erica Vasquez, Erica Vasquez 08/25/2019 2:45 PM Medical Record Number:[id number] Patient Account Number: [id number] Date of Birth/Sex: Treating RN: [birth year] (85 y.o. Erica Vasquez Primary Care Provider: Evlyn Courier Other Clinician: Referring Provider: Treating Provider/Extender:Desarie Feild, Leavy Cella, Charolett Bumpers in Treatment: 0 Information Obtained from: Patient Chief Complaint 08/25/2019; patient is here along with her niece for review of a wound on the left medial heel Electronic Signature(s) Signed: 08/25/2019 6:03:12 PM By: Baltazar Najjar MD Entered By: Baltazar Najjar on 08/25/2019 16:48:32 -------------------------------------------------------------------------------- Debridement Details Patient Name: Erica Vasquez Date of Service: 08/25/2019 2:45 PM Medical Record Number:[id number] Patient Account Number: [id number] Date of Birth/Sex: Treating RN: 08-01-1930 (85 y.o. Erica Vasquez Primary Care Provider: Evlyn Courier Other Clinician: Referring Provider: Treating Provider/Extender:Iriel Nason, Leavy Cella, Charolett Bumpers in Treatment: 0 Debridement Performed for Wound #1 Left,Medial Calcaneus Assessment: Performed By: Physician Maxwell Caul., MD Debridement Type: Debridement Level of Consciousness (Pre- Awake and Alert procedure): Pre-procedure Verification/Time Out Taken: Yes - 16:17 Start Time: 16:17 Pain Control: Lidocaine 5% topical ointment Total Area Debrided (L x W): 1 (cm) x 0.5 (cm) = 0.5 (cm) Tissue and other material Viable, Non-Viable, Callus, Slough, Subcutaneous, Slough debrided: Level: Skin/Subcutaneous Tissue Debridement Description: Excisional Instrument: Curette Bleeding: Minimum Hemostasis Achieved: Pressure End Time: 16:19 Procedural Pain: 0 Post Procedural Pain: 0 Response to Treatment: Procedure was tolerated  well Level of Consciousness Awake and Alert (Post-procedure): Post Debridement Measurements of Total Wound Length: (cm) 1 Stage: Unstageable/Unclassified Width: (cm) 0.5 Depth: (cm) 0.1 Volume: (cm) 0.039 Character of Wound/Ulcer Post Improved Debridement: Post Procedure Diagnosis Same as Pre-procedure Electronic Signature(s) Signed: 08/25/2019 6:03:12 PM By: Baltazar Najjar MD Signed: 09/30/2019 12:05:18 PM By: Yevonne Pax RN Entered By: Baltazar Najjar on 08/25/2019 16:48:12 -------------------------------------------------------------------------------- HPI Details Patient Name: Date of Service: Erica Vasquez 08/25/2019 2:45 PM Medical Record Number:[id number] Patient Account Number: [id number] Date of Birth/Sex: Treating RN: [birth year] (85 y.o. Erica Vasquez Primary Care Provider: Evlyn Courier Other Clinician: Referring Provider: Treating Provider/Extender:Myrtle Barnhard, Leavy Cella, Charolett Bumpers in Treatment: 0 History of Present Illness HPI Description: ADMISSION 08/25/2019 This is an 85 year old woman with advanced Alzheimer's disease. She is accompanied by her niece and lives at home with her son. She apparently has had this pressure ulcer since March after hospitalization for a left hip fracture. She has been followed by [facility] after seeing her primary care provider once in April. We believe they have been using calcium alginate on this and home health is changing this twice a week. Past medical history; left hip hemiarthroplasty in February 2020 for a left hip fracture, Alzheimer's disease, malnutrition, hypertension, anemia and blindness. ABIs were not able to be obtained in our clinic secondary to restlessness Electronic Signature(s) Signed: 08/27/2019 5:45:12 PM By: Baltazar Najjar MD Signed: 08/31/2019 5:46:53 PM By: Zandra Abts RN, BSN Previous Signature: 08/25/2019 6:03:12 PM Version By: Baltazar Najjar MD Entered By: Zandra Abts on  08/27/2019 07:58:57 -------------------------------------------------------------------------------- Physical Exam Details Patient Name: Date of Service: Erica Vasquez 08/25/2019 2:45 PM Medical Record Number:[id number] Patient Account Number: [id number] Date of Birth/Sex: Treating RN: September 03, 1930 (85 y.o. Erica Vasquez Primary Care Provider: Evlyn Courier Other Clinician: Referring Provider: Treating Provider/Extender:Correen Bubolz, Leavy Cella, Charolett Bumpers in Treatment: 0 Constitutional Patient is hypertensive.. Pulse regular and within target range for patient.Marland Kitchen Respirations regular, non-labored and within target range.. Temperature is normal and within the target  range for the patient.Marland Kitchen Appears in no distress. Eyes Eyes are tightly closed. Respiratory work of breathing is normal. Bilateral breath sounds are clear and equal in all lobes with no wheezes, rales or rhonchi.. Cardiovascular 2 out of 6 pansystolic murmur at the lower left sternal border and 2 out of 6 decrescendo diastolic murmur. Dorsalis pedis pulses are palpable. Chest Large mass in the right breast. No axillary adenopathy. Integumentary (Hair, Skin) No primary skin issues are seen. Psychiatric Very restless behavior. Notes Wound exam; left medial heel. This is covered in a large area of the nature and callus. Using a #5 curette I removed most of this. Still a small area with nonviable subcutaneous tissue which was also so removed. This left a very tiny clean open area surprisingly all of this looks quite good. There was no evidence of infection Electronic Signature(s) Signed: 08/25/2019 6:03:12 PM By: Baltazar Najjar MD Entered By: Baltazar Najjar on 08/25/2019 16:53:02 -------------------------------------------------------------------------------- Physician Orders Details Patient Name: Date of Service: Erica Vasquez 08/25/2019 2:45 PM Medical Record Number:[id number] Patient Account Number:  [id number] Date of Birth/Sex: Treating RN: [birth year] (85 y.o. Erica Vasquez Primary Care Provider: Evlyn Courier Other Clinician: Referring Provider: Treating Provider/Extender:Prabhleen Montemayor, Leavy Cella, Charolett Bumpers in Treatment: 0 Verbal / Phone Orders: No Diagnosis Coding Follow-up Appointments Return Appointment in 2 weeks. Dressing Change Frequency Other: - 2 times per week Wound Cleansing May shower and wash wound with soap and water. Primary Wound Dressing Wound #1 Left,Medial Calcaneus Silver Collagen - moisten with hydrogel Secondary Dressing Kerlix/Rolled Gauze - secure with tape Dry Gauze Heel Cup Home Health Continue Home [facility] skilled nursing for wound care. - continue [facility] Electronic Signature(s) Signed: 08/25/2019 6:03:12 PM By: Baltazar Najjar MD Signed: 09/30/2019 12:05:18 PM By: Yevonne Pax RN Entered By: Yevonne Pax on 08/25/2019 16:49:42 -------------------------------------------------------------------------------- Problem List Details Patient Name: Date of Service: Erica Vasquez 08/25/2019 2:45 PM Medical Record Number:[id number] Patient Account Number: [id number] Date of Birth/Sex: Treating RN: Nov 22, 1929 (86 y.o. Erica Vasquez Primary Care Provider: Evlyn Courier Other Clinician: Referring Provider: Treating Provider/Extender:Dollie Mayse, Leavy Cella, Charolett Bumpers in Treatment: 0 Active Problems ICD-10 Evaluated Encounter Code Description Active Date Today Diagnosis L89.620 Pressure ulcer of left heel, unstageable 08/25/2019 No Yes G30.8 Other Alzheimer's disease 08/25/2019 No Yes Inactive Problems Resolved Problems Electronic Signature(s) Signed: 08/25/2019 6:03:12 PM By: Baltazar Najjar MD Entered By: Baltazar Najjar on 08/25/2019 16:47:44 -------------------------------------------------------------------------------- Progress Note Details Patient Name: Date of Service: Erica Vasquez 08/25/2019 2:45  PM Medical Record Number:[id number] Patient Account Number: [id number] Date of Birth/Sex: Treating RN: May 05, 1930 (85 y.o. Erica Vasquez Primary Care Provider: Evlyn Courier Other Clinician: Referring Provider: Treating Provider/Extender:Renell Allum, Leavy Cella, Charolett Bumpers in Treatment: 0 Subjective Chief Complaint Information obtained from Patient 08/25/2019; patient is here along with her niece for review of a wound on the left medial heel History of Present Illness (HPI) ADMISSION 08/25/2019 This is an 86 year old woman with advanced Alzheimer's disease. She is accompanied by her niece and lives at home with her son. She apparently has had this pressure ulcer since March after hospitalization for a left hip fracture. She has been followed by [facility] after seeing her primary care provider once in April. We believe they have been using calcium alginate on this and home health is changing this twice a week. Past medical history; left hip hemiarthroplasty in February 2020 for a left hip fracture, Alzheimer's disease, malnutrition, hypertension, anemia and blindness. ABIs were  not able to be obtained in our clinic secondary to restlessness Patient History Allergies bimatoprost (Severity: Moderate, Reaction: itching), brimonidine (Severity: Moderate, Reaction: itching), timolol (Severity: Moderate, Reaction: itching) Family History Heart Disease - Father,Siblings, Hypertension - Mother,Father,Siblings, No family history of Cancer, Diabetes, Hereditary Spherocytosis, Kidney Disease, Lung Disease, Seizures, Stroke, Thyroid Problems, Tuberculosis. Social History Never smoker, Marital Status - Divorced, Alcohol Use - Never, Drug Use - No History, Caffeine Use - Never. 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 Ear/Nose/Mouth/Throat Denies complaints or symptoms of Chronic sinus problems or rhinitis. Respiratory Denies complaints or symptoms of Chronic or frequent coughs, Shortness of Breath. Cardiovascular Denies complaints or symptoms of Chest pain. Gastrointestinal Denies complaints or symptoms of Frequent diarrhea, Nausea, Vomiting. Endocrine Denies complaints or symptoms of Heat/cold intolerance. Genitourinary Denies complaints or symptoms of Frequent urination. Integumentary (Skin) Complains or has symptoms of Wounds - wound on left heel. Musculoskeletal Complains or has symptoms of Muscle Weakness - generalized. Neurologic Denies complaints or symptoms of Numbness/parasthesias. Psychiatric Denies complaints or symptoms of Claustrophobia, Suicidal. Objective Constitutional Patient is hypertensive.. Pulse regular and within target range for patient.Marland Kitchen. Respirations regular, non-labored and within target range.. Temperature is normal and within the target range for the patient.Marland Kitchen. Appears in no distress. Vitals Time Taken: 3:48 PM, Temperature: 98.0 F, Pulse: 56 bpm, Respiratory Rate: 18 breaths/min, Blood Pressure: 171/73 mmHg. Eyes Eyes are tightly closed. Respiratory work of breathing is normal. Bilateral breath sounds are clear and equal in all lobes with no wheezes, rales or rhonchi.. Cardiovascular 2 out of 6 pansystolic murmur at the lower left sternal border and 2 out of 6 decrescendo diastolic murmur. Dorsalis pedis pulses are palpable. Chest Large mass in the right breast. No axillary  adenopathy. Psychiatric Very restless behavior. General Notes: Wound exam; left medial heel. This is covered in a large area of the nature and callus. Using a #5 curette I removed most of this. Still a small area with nonviable subcutaneous tissue which was also so removed. This left a very tiny clean open area surprisingly all of this looks quite good. There was no evidence of infection Integumentary (Hair, Skin) No primary skin issues are seen. Wound #1 status is Open. Original cause of wound was Pressure Injury. The wound is located on the Left,Medial Calcaneus. The wound measures 1cm length x 0.5cm width x 0.1cm depth; 0.393cm^2 area and 0.039cm^3 volume. There is no tunneling or undermining noted. There is a small amount of serosanguineous drainage noted. The wound margin is thickened. There is no granulation within the wound bed. There is a large (67-100%) amount of necrotic tissue within the wound bed including Eschar. Assessment Active Problems ICD-10 Pressure ulcer of left heel, unstageable Other Alzheimer's disease Procedures Wound #1 Pre-procedure diagnosis of Wound #1 is a Pressure Ulcer located on the Left,Medial Calcaneus . There was a Excisional Skin/Subcutaneous Tissue Debridement with a total area of 0.5 sq cm performed by Maxwell Caulobson, Alver Leete G., MD. With the following instrument(s): Curette to remove Viable and Non-Viable tissue/material. Material removed includes Callus, Subcutaneous Tissue, and Slough after achieving pain control using Lidocaine  5% topical ointment. No specimens were taken. A time out was conducted at 16:17, prior to the start of the procedure. A Minimum amount of bleeding was controlled with Pressure. The procedure was tolerated well with a pain level of 0 throughout and a pain level of 0 following the procedure. Post Debridement Measurements: 1cm length x 0.5cm width x 0.1cm depth; 0.039cm^3 volume. Post debridement Stage noted as  Unstageable/Unclassified. Character of Wound/Ulcer Post Debridement is improved. Post procedure Diagnosis Wound #1: Same as Pre-Procedure Plan Follow-up Appointments: Return Appointment in 2 weeks. Dressing Change Frequency: Other: - 2 times per week Wound Cleansing: May shower and wash wound with soap and water. Primary Wound Dressing: Wound #1 Left,Medial Calcaneus: Silver Collagen - moisten with hydrogel Secondary Dressing: Kerlix/Rolled Gauze - secure with tape Dry Gauze Heel Cup Home Health: [facility] skilled nursing for wound care. - continue [place] 1. Moistened silver collagen on the remaining wound on the heel/heel cup/Curlex and gauze. 2. Home health can change this twice a week 3. They already have a bunny boots 4. I discussed the right breast mass with the niece this was apparently known to the family and previous discussion has led to no treatment. I am hopeful that the skin will stay intact for this large firm lesion. There was no inflammation no obvious tenderness here Electronic Signature(s) Signed: 08/27/2019 5:45:12 PM By: Linton Ham MD Signed: 08/31/2019 5:46:53 PM By: Levan Hurst RN, BSN Previous Signature: 08/25/2019 6:03:12 PM Version By: Linton Ham MD Entered By: Levan Hurst on 08/27/2019 07:59:09 -------------------------------------------------------------------------------- HxROS Details Patient Name: Date of Service: Erica Vasquez 08/25/2019 2:45 PM Medical Record [id number] Patient Account Number: [id number] Date of Birth/Sex: Treating RN: [birth year] (85 y.o. Erica Vasquez Primary Care Provider: Maggie Font Other Clinician: Referring Provider: Treating Provider/Extender:Damoni Erker, Alisia Ferrari, Charlestine Night in Treatment: 0 Constitutional Symptoms (General Health) Complaints and Symptoms: Negative for: Fatigue; Fever; Chills; Marked Weight Change Ear/Nose/Mouth/Throat Complaints and  Symptoms: Negative for: Chronic sinus problems or rhinitis Respiratory Complaints and Symptoms: Negative for: Chronic or frequent coughs; Shortness of Breath Cardiovascular Complaints and Symptoms: Negative for: Chest pain Medical History: Positive for: Hypertension Gastrointestinal Complaints and Symptoms: Negative for: Frequent diarrhea; Nausea; Vomiting Endocrine Complaints and Symptoms: Negative for: Heat/cold intolerance Genitourinary Complaints and Symptoms: Negative for: Frequent urination Integumentary (Skin) Complaints and Symptoms: Positive for: Wounds - wound on left heel Medical History: Negative for: History of Burn Musculoskeletal Complaints and Symptoms: Positive for: Muscle Weakness - generalized Medical History: Positive for: Osteoarthritis Past Medical History Notes: bilateral hip replacements Neurologic Complaints and Symptoms: Negative for: Numbness/parasthesias Medical History: Positive for: Dementia Psychiatric Complaints and Symptoms: Negative for: Claustrophobia; Suicidal Eyes Medical History: Positive for: Cataracts - both eyes; Glaucoma - both eyes Past Medical History Notes: 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 Hematologic/Lymphatic Immunological Oncologic HBO Extended History Items Eyes: Eyes: Cataracts Glaucoma Immunizations Pneumococcal Vaccine: Received Pneumococcal Vaccination: No Implantable Devices None Family and Social History Cancer: No; Diabetes: No; Heart Disease: Yes - Father,Siblings; Hereditary Spherocytosis: No; Hypertension: Yes - Mother,Father,Siblings; Kidney Disease: No; Lung Disease: No; Seizures: No; Stroke: No; Thyroid Problems: No; Tuberculosis: No; Never smoker; Marital Status - Divorced; Alcohol Use: Never; Drug Use: No History; Caffeine Use: Never; Financial Concerns: No; Food, Clothing or Shelter Needs: No; Support System Lacking: No; Transportation Concerns: No Engineer, maintenance) Signed: 08/25/2019  6:03:12 PM By: Linton Ham MD Signed: 08/31/2019 5:46:53 PM By: Levan Hurst RN, BSN Entered By: Levan Hurst on 08/25/2019 16:04:19 --------------------------------------------------------------------------------  SuperBill Details Patient Name: Date of Service: Erica Vasquez, Erica Vasquez 08/25/2019 Medical Record Number:[id number] Patient Account Number: [id number] Date of Birth/Sex: Treating RN: 1930-10-05 (85 y.o. Erica Vasquez Primary Care Provider: Evlyn Courier Other Clinician: Referring Provider: Treating Provider/Extender:Madalyn Legner, Leavy Cella, Charolett Bumpers in Treatment: 0 Diagnosis Coding ICD-10 Codes Code Description [id number] Pressure ulcer of left heel, unstageable G30.8 Other Alzheimer's disease Facility Procedures CPT4 Code: 04540981 Description: 99213 - WOUND CARE VISIT-LEV 3 EST PT Modifier: 25 Quantity: 1 Physician Procedures CPT4 Code: 1914782 Description: WC PHYS LEVEL 3 NEW PT ICD-10 Diagnosis Description L89.620 Pressure ulcer of left heel, unstageable G30.8 Other Alzheimer's disease Modifier: 25 Quantity: 1 CPT4 Code: 9562130 Description: 11042 - WC PHYS SUBQ TISS 20 SQ CM ICD-10 Diagnosis Description L89.620 Pressure ulcer of left heel, unstageable Modifier: Quantity: 1 Electronic Signature(s) Signed: 08/25/2019 6:03:12 PM By: Baltazar Najjar MD Signed: 09/30/2019 12:05:18 PM By: Yevonne Pax RN Entered By: Yevonne Pax on 08/25/2019 17:18:04

## 2019-10-03 ENCOUNTER — Other Ambulatory Visit: Payer: Self-pay | Admitting: Physician Assistant

## 2019-11-23 DIAGNOSIS — G309 Alzheimer's disease, unspecified: Secondary | ICD-10-CM | POA: Diagnosis not present

## 2019-11-23 DIAGNOSIS — M1711 Unilateral primary osteoarthritis, right knee: Secondary | ICD-10-CM | POA: Diagnosis not present

## 2019-11-23 DIAGNOSIS — N6311 Unspecified lump in the right breast, upper outer quadrant: Secondary | ICD-10-CM | POA: Diagnosis not present

## 2019-11-23 DIAGNOSIS — I10 Essential (primary) hypertension: Secondary | ICD-10-CM | POA: Diagnosis not present

## 2019-12-24 ENCOUNTER — Ambulatory Visit: Payer: Medicare Other

## 2019-12-28 ENCOUNTER — Ambulatory Visit: Payer: Medicare PPO | Attending: Internal Medicine

## 2019-12-28 DIAGNOSIS — Z23 Encounter for immunization: Secondary | ICD-10-CM

## 2019-12-28 NOTE — Progress Notes (Signed)
   Covid-19 Vaccination Clinic  Name:  Erica Vasquez    MRN: [id number] DOB: Jul 14, 1930  12/28/2019  [name] was observed post Covid-19 immunization for 15 minutes without incident. She was provided with Vaccine Information Sheet and instruction to access the V-Safe system.   [name] was instructed to call 911 with any severe reactions post vaccine: Marland Kitchen Difficulty breathing  . Swelling of face and throat  . A fast heartbeat  . A bad rash all over body  . Dizziness and weakness   Immunizations Administered    Name Date Dose VIS Date Route   Pfizer COVID-19 Vaccine 12/28/2019 12:16 PM 0.3 mL 10/02/2019 Intramuscular   Manufacturer: [employer], [employer]   Lot: [id number]   NDC: [address]-9

## 2020-02-01 DIAGNOSIS — H401133 Primary open-angle glaucoma, bilateral, severe stage: Secondary | ICD-10-CM | POA: Diagnosis not present

## 2020-02-02 ENCOUNTER — Ambulatory Visit: Payer: Medicare PPO | Attending: Internal Medicine

## 2020-02-02 DIAGNOSIS — Z23 Encounter for immunization: Secondary | ICD-10-CM

## 2020-02-02 NOTE — Progress Notes (Signed)
   Covid-19 Vaccination Clinic  Name:  LAROSE BATRES    MRN: [id number] DOB: [birth year]  02/02/2020  Ms. Tenaglia was observed post Covid-19 immunization for 15 minutes without incident. She was provided with Vaccine Information Sheet and instruction to access the V-Safe system.   Ms. Sandoval was instructed to call 911 with any severe reactions post vaccine: Marland Kitchen Difficulty breathing  . Swelling of face and throat  . A fast heartbeat  . A bad rash all over body  . Dizziness and weakness   Immunizations Administered    Name Date Dose VIS Date Route   Pfizer COVID-19 Vaccine 02/02/2020 12:27 PM 0.3 mL 10/02/2019 Intramuscular   Manufacturer: [employer], [employer]   Lot: [id number]   NDC: [address]-6

## 2020-04-25 DIAGNOSIS — G309 Alzheimer's disease, unspecified: Secondary | ICD-10-CM | POA: Diagnosis not present

## 2020-04-25 DIAGNOSIS — I10 Essential (primary) hypertension: Secondary | ICD-10-CM | POA: Diagnosis not present

## 2020-04-25 DIAGNOSIS — K5900 Constipation, unspecified: Secondary | ICD-10-CM | POA: Diagnosis not present

## 2020-04-25 DIAGNOSIS — M1711 Unilateral primary osteoarthritis, right knee: Secondary | ICD-10-CM | POA: Diagnosis not present

## 2020-04-25 DIAGNOSIS — N6310 Unspecified lump in the right breast, unspecified quadrant: Secondary | ICD-10-CM | POA: Diagnosis not present

## 2020-04-25 DIAGNOSIS — N6311 Unspecified lump in the right breast, upper outer quadrant: Secondary | ICD-10-CM | POA: Diagnosis not present

## 2020-06-20 DIAGNOSIS — R55 Syncope and collapse: Secondary | ICD-10-CM | POA: Diagnosis not present

## 2020-06-21 DIAGNOSIS — M19071 Primary osteoarthritis, right ankle and foot: Secondary | ICD-10-CM | POA: Diagnosis not present

## 2020-06-21 DIAGNOSIS — M1711 Unilateral primary osteoarthritis, right knee: Secondary | ICD-10-CM | POA: Diagnosis not present

## 2020-06-21 DIAGNOSIS — I10 Essential (primary) hypertension: Secondary | ICD-10-CM | POA: Diagnosis not present

## 2020-08-01 DIAGNOSIS — G301 Alzheimer's disease with late onset: Secondary | ICD-10-CM | POA: Diagnosis not present

## 2020-08-01 DIAGNOSIS — M159 Polyosteoarthritis, unspecified: Secondary | ICD-10-CM | POA: Diagnosis not present

## 2020-08-01 DIAGNOSIS — K5900 Constipation, unspecified: Secondary | ICD-10-CM | POA: Diagnosis not present

## 2020-08-01 DIAGNOSIS — I10 Essential (primary) hypertension: Secondary | ICD-10-CM | POA: Diagnosis not present

## 2020-08-02 ENCOUNTER — Ambulatory Visit: Payer: Self-pay

## 2020-08-02 ENCOUNTER — Ambulatory Visit: Payer: Medicare PPO | Admitting: Orthopaedic Surgery

## 2020-08-02 ENCOUNTER — Encounter: Payer: Self-pay | Admitting: Orthopaedic Surgery

## 2020-08-02 DIAGNOSIS — S72001A Fracture of unspecified part of neck of right femur, initial encounter for closed fracture: Secondary | ICD-10-CM

## 2020-08-02 MED ORDER — METHYLPREDNISOLONE ACETATE 40 MG/ML IJ SUSP
40.0000 mg | INTRAMUSCULAR | Status: AC | PRN
  Administered 2020-08-02: 40 mg via INTRA_ARTICULAR

## 2020-08-02 MED ORDER — BUPIVACAINE HCL 0.5 % IJ SOLN
2.0000 mL | INTRAMUSCULAR | Status: AC | PRN
  Administered 2020-08-02: 2 mL via INTRA_ARTICULAR

## 2020-08-02 MED ORDER — LIDOCAINE HCL 1 % IJ SOLN
2.0000 mL | INTRAMUSCULAR | Status: AC | PRN
  Administered 2020-08-02: 2 mL

## 2020-08-02 NOTE — Progress Notes (Signed)
Office Visit Note   Patient: Erica Vasquez           Date of Birth: 1930/06/30           MRN: [id number] Visit Date: 08/02/2020              Requested by: Mirna Mires, MD [address] [place],  [place] [address] PCP: Mirna Mires, MD   Assessment & Plan: Visit Diagnoses:  1. Closed fracture of right hip, initial encounter [facility])     Plan: Impression is right lower extremity pain.  I think that this could either be stemming from degenerative arthritis of the right knee or from her back.  I would like to try cortisone injection in her right knee today to see if this will improve things.  They will also consider putting her back on Tylenol and Celebrex.  She will return if she does not notice any improvement.  Follow-Up Instructions: Return if symptoms worsen or fail to improve.   Orders:  Orders Placed This Encounter  Procedures  . XR FEMUR, MIN 2 VIEWS RIGHT  . XR Tibia/Fibula Right   No orders of the defined types were placed in this encounter.     Procedures: Large Joint Inj: R knee on 08/02/2020 3:54 PM Indications: pain Details: 22 G needle  Arthrogram: No  Medications: 40 mg methylPREDNISolone acetate 40 MG/ML; 2 mL lidocaine 1 %; 2 mL bupivacaine 0.5 % Consent was given by the patient. Patient was prepped and draped in the usual sterile fashion.       Clinical Data: No additional findings.   Subjective: Chief Complaint  Patient presents with  . Right Leg - Pain    Patient is a 86 year old female with advanced dementia who was brought here by her niece for evaluation of right knee swelling and right leg pain.  They have noticed that she has not bared weight on her right leg for about 2weeks.  She was previously ambulating with a walker.  She seems to avoid putting any pressure on her right leg.  She is status post right partial hip replacement that I performed a while back.  She has not had any recent injuries or falls.  She stopped taking Celebrex  and Tylenol about a week ago.   Review of Systems  Unable to perform ROS: Dementia     Objective: Vital Signs: There were no vitals taken for this visit.  Physical Exam Vitals and nursing note reviewed.  Constitutional:      Appearance: She is well-developed.  Pulmonary:     Effort: Pulmonary effort is normal.  Skin:    General: Skin is warm.     Capillary Refill: Capillary refill takes less than 2 seconds.  Neurological:     Mental Status: She is alert and oriented to person, place, and time.  Psychiatric:        Behavior: Behavior normal.        Thought Content: Thought content normal.        Judgment: Judgment normal.     Ortho Exam Right lower extremity exam shows no gross bony crepitus or movement.  No joint effusion.  Range of motion of the hip knee and ankle appear to be well-tolerated. Specialty Comments:  No specialty comments available.  Imaging: XR FEMUR, MIN 2 VIEWS RIGHT  Result Date: 08/02/2020 Advanced degenerative changes of the right knee.  No acute abnormalities.  XR Tibia/Fibula Right  Result Date: 08/02/2020 No acute abnormalities.  PMFS History: Patient Active Problem List   Diagnosis Date Noted  . Postoperative anemia due to acute blood loss 11/27/2018  . Hip fracture (HCC) 11/23/2018  . Chronic pain of right knee 03/11/2018  . Pain in right hip 02/10/2018  . Anemia 02/04/2018  . Constipation due to opioid therapy 01/31/2018  . Alzheimer disease (HCC) 01/31/2018  . Malnutrition of moderate degree 01/27/2018  . Fall 01/25/2018  . Blindness 01/25/2018  . Hypokalemia 01/25/2018  . Lytic bone lesion of hip 01/25/2018  . Essential hypertension, benign    Past Medical History:  Diagnosis Date  . Alzheimer disease (HCC)    mild  . Arthritis   . Blind   . Cataract   . Cataract cortical, senile   . Dementia (HCC)   . Essential hypertension   . Glaucoma   . Hypertension     Family History  Problem Relation Age of Onset  .  Dementia Mother   . Stroke Mother   . Dementia Father   . Glaucoma Father   . Cataracts Father   . Hypertension Father   . Cataracts Sister   . Glaucoma Sister   . Hypertension Sister   . Retinal detachment Sister     Past Surgical History:  Procedure Laterality Date  . ABDOMINAL HYSTERECTOMY    . CATARACT EXTRACTION    . GLAUCOMA SURGERY Bilateral 2010   SLT  . HIP ARTHROPLASTY Left 11/23/2018   Procedure: ARTHROPLASTY BIPOLAR HIP (HEMIARTHROPLASTY);  Surgeon: Eldred Manges, MD;  Location: [facility] OR;  Service: Orthopedics;  Laterality: Left;  . TOTAL HIP ARTHROPLASTY Right 01/26/2018   Procedure: TOTAL HIP ARTHROPLASTY ANTERIOR APPROACH;  Surgeon: Tarry Kos, MD;  Location: [facility] OR;  Service: Orthopedics;  Laterality: Right;   Social History   Occupational History  . Not on file  Tobacco Use  . Smoking status: Never Smoker  . Smokeless tobacco: Never Used  Vaping Use  . Vaping Use: Never used  Substance and Sexual Activity  . Alcohol use: Not Currently  . Drug use: Not Currently  . Sexual activity: Not on file

## 2020-08-10 DIAGNOSIS — I10 Essential (primary) hypertension: Secondary | ICD-10-CM | POA: Diagnosis not present

## 2020-08-10 DIAGNOSIS — L89152 Pressure ulcer of sacral region, stage 2: Secondary | ICD-10-CM | POA: Diagnosis not present

## 2020-08-10 DIAGNOSIS — K5900 Constipation, unspecified: Secondary | ICD-10-CM | POA: Diagnosis not present

## 2020-08-20 DIAGNOSIS — M19071 Primary osteoarthritis, right ankle and foot: Secondary | ICD-10-CM | POA: Diagnosis not present

## 2020-08-20 DIAGNOSIS — I10 Essential (primary) hypertension: Secondary | ICD-10-CM | POA: Diagnosis not present

## 2020-08-20 DIAGNOSIS — M1711 Unilateral primary osteoarthritis, right knee: Secondary | ICD-10-CM | POA: Diagnosis not present

## 2020-08-21 ENCOUNTER — Inpatient Hospital Stay (HOSPITAL_COMMUNITY)
Admission: EM | Admit: 2020-08-21 | Discharge: 2020-09-21 | DRG: 064 | Disposition: E | Payer: Medicare PPO | Attending: Internal Medicine | Admitting: Internal Medicine

## 2020-08-21 ENCOUNTER — Other Ambulatory Visit: Payer: Self-pay

## 2020-08-21 ENCOUNTER — Encounter (HOSPITAL_COMMUNITY): Payer: Self-pay | Admitting: Emergency Medicine

## 2020-08-21 ENCOUNTER — Emergency Department (HOSPITAL_COMMUNITY): Payer: Medicare PPO

## 2020-08-21 DIAGNOSIS — G309 Alzheimer's disease, unspecified: Secondary | ICD-10-CM | POA: Diagnosis not present

## 2020-08-21 DIAGNOSIS — Z7189 Other specified counseling: Secondary | ICD-10-CM

## 2020-08-21 DIAGNOSIS — Z681 Body mass index (BMI) 19 or less, adult: Secondary | ICD-10-CM

## 2020-08-21 DIAGNOSIS — R64 Cachexia: Secondary | ICD-10-CM | POA: Diagnosis present

## 2020-08-21 DIAGNOSIS — E860 Dehydration: Secondary | ICD-10-CM | POA: Diagnosis present

## 2020-08-21 DIAGNOSIS — G9341 Metabolic encephalopathy: Secondary | ICD-10-CM | POA: Diagnosis present

## 2020-08-21 DIAGNOSIS — I63533 Cerebral infarction due to unspecified occlusion or stenosis of bilateral posterior cerebral arteries: Secondary | ICD-10-CM | POA: Diagnosis not present

## 2020-08-21 DIAGNOSIS — E876 Hypokalemia: Secondary | ICD-10-CM | POA: Diagnosis present

## 2020-08-21 DIAGNOSIS — E872 Acidosis: Secondary | ICD-10-CM | POA: Diagnosis present

## 2020-08-21 DIAGNOSIS — N3289 Other specified disorders of bladder: Secondary | ICD-10-CM | POA: Diagnosis not present

## 2020-08-21 DIAGNOSIS — F0280 Dementia in other diseases classified elsewhere without behavioral disturbance: Secondary | ICD-10-CM | POA: Diagnosis present

## 2020-08-21 DIAGNOSIS — G9389 Other specified disorders of brain: Secondary | ICD-10-CM | POA: Diagnosis not present

## 2020-08-21 DIAGNOSIS — R222 Localized swelling, mass and lump, trunk: Secondary | ICD-10-CM | POA: Diagnosis present

## 2020-08-21 DIAGNOSIS — I2699 Other pulmonary embolism without acute cor pulmonale: Secondary | ICD-10-CM | POA: Diagnosis present

## 2020-08-21 DIAGNOSIS — I63311 Cerebral infarction due to thrombosis of right middle cerebral artery: Principal | ICD-10-CM | POA: Diagnosis present

## 2020-08-21 DIAGNOSIS — L89322 Pressure ulcer of left buttock, stage 2: Secondary | ICD-10-CM | POA: Diagnosis present

## 2020-08-21 DIAGNOSIS — R0600 Dyspnea, unspecified: Secondary | ICD-10-CM

## 2020-08-21 DIAGNOSIS — H409 Unspecified glaucoma: Secondary | ICD-10-CM | POA: Diagnosis present

## 2020-08-21 DIAGNOSIS — L8990 Pressure ulcer of unspecified site, unspecified stage: Secondary | ICD-10-CM | POA: Insufficient documentation

## 2020-08-21 DIAGNOSIS — Z20822 Contact with and (suspected) exposure to covid-19: Secondary | ICD-10-CM | POA: Diagnosis present

## 2020-08-21 DIAGNOSIS — G319 Degenerative disease of nervous system, unspecified: Secondary | ICD-10-CM | POA: Diagnosis not present

## 2020-08-21 DIAGNOSIS — I5023 Acute on chronic systolic (congestive) heart failure: Secondary | ICD-10-CM | POA: Diagnosis present

## 2020-08-21 DIAGNOSIS — R680 Hypothermia, not associated with low environmental temperature: Secondary | ICD-10-CM | POA: Diagnosis present

## 2020-08-21 DIAGNOSIS — Z515 Encounter for palliative care: Secondary | ICD-10-CM | POA: Diagnosis not present

## 2020-08-21 DIAGNOSIS — Z96643 Presence of artificial hip joint, bilateral: Secondary | ICD-10-CM | POA: Diagnosis present

## 2020-08-21 DIAGNOSIS — R4182 Altered mental status, unspecified: Secondary | ICD-10-CM

## 2020-08-21 DIAGNOSIS — I63313 Cerebral infarction due to thrombosis of bilateral middle cerebral arteries: Secondary | ICD-10-CM | POA: Diagnosis not present

## 2020-08-21 DIAGNOSIS — R1310 Dysphagia, unspecified: Secondary | ICD-10-CM | POA: Diagnosis present

## 2020-08-21 DIAGNOSIS — F0390 Unspecified dementia without behavioral disturbance: Secondary | ICD-10-CM

## 2020-08-21 DIAGNOSIS — L89153 Pressure ulcer of sacral region, stage 3: Secondary | ICD-10-CM | POA: Diagnosis present

## 2020-08-21 DIAGNOSIS — R636 Underweight: Secondary | ICD-10-CM | POA: Diagnosis present

## 2020-08-21 DIAGNOSIS — Z888 Allergy status to other drugs, medicaments and biological substances status: Secondary | ICD-10-CM

## 2020-08-21 DIAGNOSIS — D61818 Other pancytopenia: Secondary | ICD-10-CM | POA: Diagnosis present

## 2020-08-21 DIAGNOSIS — R0602 Shortness of breath: Secondary | ICD-10-CM | POA: Diagnosis not present

## 2020-08-21 DIAGNOSIS — R188 Other ascites: Secondary | ICD-10-CM | POA: Diagnosis not present

## 2020-08-21 DIAGNOSIS — E785 Hyperlipidemia, unspecified: Secondary | ICD-10-CM | POA: Diagnosis present

## 2020-08-21 DIAGNOSIS — Z8249 Family history of ischemic heart disease and other diseases of the circulatory system: Secondary | ICD-10-CM

## 2020-08-21 DIAGNOSIS — Z66 Do not resuscitate: Secondary | ICD-10-CM | POA: Diagnosis not present

## 2020-08-21 DIAGNOSIS — I6782 Cerebral ischemia: Secondary | ICD-10-CM | POA: Diagnosis not present

## 2020-08-21 DIAGNOSIS — R54 Age-related physical debility: Secondary | ICD-10-CM | POA: Diagnosis present

## 2020-08-21 DIAGNOSIS — R29818 Other symptoms and signs involving the nervous system: Secondary | ICD-10-CM | POA: Diagnosis not present

## 2020-08-21 DIAGNOSIS — R001 Bradycardia, unspecified: Secondary | ICD-10-CM | POA: Diagnosis present

## 2020-08-21 DIAGNOSIS — E162 Hypoglycemia, unspecified: Secondary | ICD-10-CM | POA: Diagnosis not present

## 2020-08-21 DIAGNOSIS — I110 Hypertensive heart disease with heart failure: Secondary | ICD-10-CM | POA: Diagnosis present

## 2020-08-21 DIAGNOSIS — D638 Anemia in other chronic diseases classified elsewhere: Secondary | ICD-10-CM | POA: Diagnosis present

## 2020-08-21 DIAGNOSIS — M1990 Unspecified osteoarthritis, unspecified site: Secondary | ICD-10-CM | POA: Diagnosis present

## 2020-08-21 DIAGNOSIS — R404 Transient alteration of awareness: Secondary | ICD-10-CM | POA: Diagnosis not present

## 2020-08-21 DIAGNOSIS — I10 Essential (primary) hypertension: Secondary | ICD-10-CM | POA: Diagnosis not present

## 2020-08-21 DIAGNOSIS — I6389 Other cerebral infarction: Secondary | ICD-10-CM | POA: Diagnosis not present

## 2020-08-21 DIAGNOSIS — Z83511 Family history of glaucoma: Secondary | ICD-10-CM

## 2020-08-21 DIAGNOSIS — K760 Fatty (change of) liver, not elsewhere classified: Secondary | ICD-10-CM | POA: Diagnosis not present

## 2020-08-21 DIAGNOSIS — Z79899 Other long term (current) drug therapy: Secondary | ICD-10-CM

## 2020-08-21 DIAGNOSIS — J449 Chronic obstructive pulmonary disease, unspecified: Secondary | ICD-10-CM | POA: Diagnosis not present

## 2020-08-21 DIAGNOSIS — E161 Other hypoglycemia: Secondary | ICD-10-CM | POA: Diagnosis not present

## 2020-08-21 DIAGNOSIS — H547 Unspecified visual loss: Secondary | ICD-10-CM | POA: Diagnosis present

## 2020-08-21 DIAGNOSIS — Z823 Family history of stroke: Secondary | ICD-10-CM

## 2020-08-21 DIAGNOSIS — R0902 Hypoxemia: Secondary | ICD-10-CM | POA: Diagnosis not present

## 2020-08-21 DIAGNOSIS — I639 Cerebral infarction, unspecified: Secondary | ICD-10-CM | POA: Diagnosis present

## 2020-08-21 DIAGNOSIS — J9811 Atelectasis: Secondary | ICD-10-CM | POA: Diagnosis not present

## 2020-08-21 LAB — COMPREHENSIVE METABOLIC PANEL
ALT: 12 U/L (ref 0–44)
AST: 23 U/L (ref 15–41)
Albumin: 2.9 g/dL — ABNORMAL LOW (ref 3.5–5.0)
Alkaline Phosphatase: 56 U/L (ref 38–126)
Anion gap: 11 (ref 5–15)
BUN: 33 mg/dL — ABNORMAL HIGH (ref 8–23)
CO2: 26 mmol/L (ref 22–32)
Calcium: 8.7 mg/dL — ABNORMAL LOW (ref 8.9–10.3)
Chloride: 103 mmol/L (ref 98–111)
Creatinine, Ser: 0.92 mg/dL (ref 0.44–1.00)
GFR, Estimated: 60 mL/min — ABNORMAL LOW (ref >60)
Glucose, Bld: 773 mg/dL (ref 70–99)
Potassium: 2.4 mmol/L — CL (ref 3.5–5.1)
Sodium: 140 mmol/L (ref 135–145)
Total Bilirubin: 0.9 mg/dL (ref 0.3–1.2)
Total Protein: 5 g/dL — ABNORMAL LOW (ref 6.5–8.1)

## 2020-08-21 LAB — CBG MONITORING, ED
Glucose-Capillary: 41 mg/dL — CL (ref 70–99)
Glucose-Capillary: 49 mg/dL — ABNORMAL LOW (ref 70–99)
Glucose-Capillary: 474 mg/dL — ABNORMAL HIGH (ref 70–99)

## 2020-08-21 LAB — CBC WITH DIFFERENTIAL/PLATELET
Abs Immature Granulocytes: 0.01 10*3/uL (ref 0.00–0.07)
Basophils Absolute: 0 10*3/uL (ref 0.0–0.1)
Basophils Relative: 0 %
Eosinophils Absolute: 0 10*3/uL (ref 0.0–0.5)
Eosinophils Relative: 0 %
HCT: 32.8 % — ABNORMAL LOW (ref 36.0–46.0)
Hemoglobin: 10.1 g/dL — ABNORMAL LOW (ref 12.0–15.0)
Immature Granulocytes: 0 %
Lymphocytes Relative: 11 %
Lymphs Abs: 0.4 10*3/uL — ABNORMAL LOW (ref 0.7–4.0)
MCH: 28.3 pg (ref 26.0–34.0)
MCHC: 30.8 g/dL (ref 30.0–36.0)
MCV: 91.9 fL (ref 80.0–100.0)
Monocytes Absolute: 0.1 10*3/uL (ref 0.1–1.0)
Monocytes Relative: 3 %
Neutro Abs: 2.9 10*3/uL (ref 1.7–7.7)
Neutrophils Relative %: 86 %
Platelets: 101 10*3/uL — ABNORMAL LOW (ref 150–400)
RBC: 3.57 MIL/uL — ABNORMAL LOW (ref 3.87–5.11)
RDW: 15.1 % (ref 11.5–15.5)
WBC: 3.4 10*3/uL — ABNORMAL LOW (ref 4.0–10.5)
nRBC: 0 % (ref 0.0–0.2)

## 2020-08-21 LAB — I-STAT CHEM 8, ED
BUN: 37 mg/dL — ABNORMAL HIGH (ref 8–23)
Calcium, Ion: 1.18 mmol/L (ref 1.15–1.40)
Chloride: 102 mmol/L (ref 98–111)
Creatinine, Ser: 0.9 mg/dL (ref 0.44–1.00)
Glucose, Bld: 536 mg/dL (ref 70–99)
HCT: 30 % — ABNORMAL LOW (ref 36.0–46.0)
Hemoglobin: 10.2 g/dL — ABNORMAL LOW (ref 12.0–15.0)
Potassium: 2.4 mmol/L — CL (ref 3.5–5.1)
Sodium: 144 mmol/L (ref 135–145)
TCO2: 31 mmol/L (ref 22–32)

## 2020-08-21 LAB — LIPASE, BLOOD: Lipase: 28 U/L (ref 11–51)

## 2020-08-21 LAB — RESPIRATORY PANEL BY RT PCR (FLU A&B, COVID)
Influenza A by PCR: NEGATIVE
Influenza B by PCR: NEGATIVE
SARS Coronavirus 2 by RT PCR: NEGATIVE

## 2020-08-21 LAB — MAGNESIUM: Magnesium: 2.1 mg/dL (ref 1.7–2.4)

## 2020-08-21 LAB — BRAIN NATRIURETIC PEPTIDE: B Natriuretic Peptide: 338.8 pg/mL — ABNORMAL HIGH (ref 0.0–100.0)

## 2020-08-21 LAB — LACTIC ACID, PLASMA: Lactic Acid, Venous: 2.1 mmol/L (ref 0.5–1.9)

## 2020-08-21 MED ORDER — IOHEXOL 300 MG/ML  SOLN
100.0000 mL | Freq: Once | INTRAMUSCULAR | Status: AC | PRN
  Administered 2020-08-21: 80 mL via INTRAVENOUS

## 2020-08-21 MED ORDER — POTASSIUM CHLORIDE 10 MEQ/100ML IV SOLN
10.0000 meq | INTRAVENOUS | Status: AC
  Administered 2020-08-21 (×2): 10 meq via INTRAVENOUS
  Filled 2020-08-21 (×2): qty 100

## 2020-08-21 MED ORDER — LACTATED RINGERS IV BOLUS
500.0000 mL | Freq: Once | INTRAVENOUS | Status: AC
  Administered 2020-08-21: 500 mL via INTRAVENOUS

## 2020-08-21 MED ORDER — DEXTROSE 10 % IV BOLUS
500.0000 mL | Freq: Once | INTRAVENOUS | Status: AC
  Administered 2020-08-21: 500 mL via INTRAVENOUS

## 2020-08-21 MED ORDER — DEXTROSE 50 % IV SOLN: 50.0000 mL | Freq: Once | INTRAVENOUS | Status: AC

## 2020-08-21 MED ORDER — DEXTROSE 50 % IV SOLN
INTRAVENOUS | Status: AC
  Administered 2020-08-21: 50 mL
  Filled 2020-08-21: qty 50

## 2020-08-21 NOTE — ED Triage Notes (Signed)
Per EMS, pt. From home with complaint of hypoglycemia, initial cbg checked at per EMS was 40mg /dl, at pm.,pt. has dementia and been non ambulatory for the past 3 weeks. Per son who is taking care of the pt. At home, pt. Has not been eating for several weeks now. Latest V/S 186/51mmhg, HR 64, respi.14.  Pt. received 2nd bag of D10 91m.per EMS. Latest cbg upon arrival to ED is 41mg /dl. Pt. Is Weak and lethargic reported as pt.'s baseline.

## 2020-08-21 NOTE — ED Notes (Signed)
Pt at CT scan.

## 2020-08-21 NOTE — ED Notes (Addendum)
Date and time results received: 2020/08/22 2141 (use smartphrase ".now" to insert current time)  Test:Glucose Critical Value: 773 Name of Provider Notified: Madilyn Hook Md    Orders Received? Or Actions Taken?: waiting on orders

## 2020-08-21 NOTE — ED Notes (Addendum)
Date and time results received: 09-12-2020 2140 (use smartphrase ".now" to insert current time)  Test: K+ Critical Value: 2.4  Name of Provider Notified: Madilyn Hook MD Orders Received? Or Actions Taken?:waiting on orders

## 2020-08-21 NOTE — ED Provider Notes (Signed)
[facility]-EMERGENCY DEPT Provider Note   CSN: [id number] Arrival date & time: 08/11/2020  1916     History Chief Complaint  Patient presents with   Hypoglycemia    Erica Vasquez is a 86 y.o. female with pertinent medical history of severe Alzheimer's disease, blindness, hypertension presents emerged department today via EMS for weakness.  Patient is present with niece who is able to tell most of the story is as patient is demented and unable to speak to me.  Patient is level 5 caveat due to dementia.  Niece states that she has not been able to walk for the past 3 weeks, did have Ortho care visit at that time.  She states that she is somewhat verbal, however today stopped responding to them.  She normally is able to speak to them about what hurts her and is able to speak a couple of words, however stopped doing that today which prompted them to call EMS.  She has been eating normally, has been taking all of her medications.  Was able to take her hypertensive medications today, they do crush these for her.  She states that she is off her baseline.  When EMS arrived her sugar was 40, they gave her 500 mL of D10, they did increase to the 200s, however when she arrived here in the ER her sugar was 41.  Niece denies any fevers, chills, vomiting, diarrhea.  HPI     Past Medical History:  Diagnosis Date   Alzheimer disease (HCC)    mild   Arthritis    Blind    Cataract    Cataract cortical, senile    Dementia (HCC)    Essential hypertension    Glaucoma    Hypertension     Patient Active Problem List   Diagnosis Date Noted   Postoperative anemia due to acute blood loss 11/27/2018   Hip fracture (HCC) 11/23/2018   Chronic pain of right knee 03/11/2018   Pain in right hip 02/10/2018   Anemia 02/04/2018   Constipation due to opioid therapy 01/31/2018   Alzheimer disease (HCC) 01/31/2018   Malnutrition of moderate degree 01/27/2018   Fall  01/25/2018   Blindness 01/25/2018   Hypokalemia 01/25/2018   Lytic bone lesion of hip 01/25/2018   Essential hypertension, benign     Past Surgical History:  Procedure Laterality Date   ABDOMINAL HYSTERECTOMY     CATARACT EXTRACTION     GLAUCOMA SURGERY Bilateral 2010   SLT   HIP ARTHROPLASTY Left 11/23/2018   Procedure: ARTHROPLASTY BIPOLAR HIP (HEMIARTHROPLASTY);  Surgeon: Eldred Manges, MD;  Location: [facility] OR;  Service: Orthopedics;  Laterality: Left;   TOTAL HIP ARTHROPLASTY Right 01/26/2018   Procedure: TOTAL HIP ARTHROPLASTY ANTERIOR APPROACH;  Surgeon: Tarry Kos, MD;  Location: [facility] OR;  Service: Orthopedics;  Laterality: Right;     OB History   No obstetric history on file.     Family History  Problem Relation Age of Onset   Dementia Mother    Stroke Mother    Dementia Father    Glaucoma Father    Cataracts Father    Hypertension Father    Cataracts Sister    Glaucoma Sister    Hypertension Sister    Retinal detachment Sister     Social History   Tobacco Use   Smoking status: Never Smoker   Smokeless tobacco: Never Used  Vaping Use   Vaping Use: Never used  Substance Use Topics  Alcohol use: Not Currently   Drug use: Not Currently    Home Medications Prior to Admission medications   Medication Sig Start Date End Date Taking? Authorizing Provider  Acetamin Chew Tab & Susp (TYLENOL CHILDRENS) 160 & 160 MG &MG/5ML THPK Take 30 mLs by mouth 2 (two) times daily as needed (pain).   Yes [provider]  amLODipine (NORVASC) 2.5 MG tablet Take 1 tablet (2.5 mg total) by mouth daily. 01/30/18 09/08/20 Yes Arrien, York Ram, MD  lactulose [facility]) 10 GM/15ML solution Take 20 g by mouth 2 (two) times daily. 08/09/20  Yes [provider]  aspirin EC 325 MG EC tablet Take 1 tablet (325 mg total) by mouth daily with breakfast. Patient not taking: Reported on 09/08/2020 11/25/18   Naida Sleight, PA-C  celecoxib  (CELEBREX) 200 MG capsule Take 1 capsule (200 mg total) by mouth 2 (two) times daily. Patient not taking: Reported on Sep 08, 2020 08/21/19   Cristie Hem, PA-C  docusate sodium (COLACE) 100 MG capsule Take 1 capsule (100 mg total) by mouth 2 (two) times daily. Patient not taking: Reported on Sep 08, 2020 11/28/18   Roberto Scales D, MD  HYDROcodone-acetaminophen (NORCO/VICODIN) 5-325 MG tablet Take 1 tablet by mouth every 6 (six) hours as needed for moderate pain (pain score 4-6). Patient not taking: Reported on 2020/09/08 11/25/18   Naida Sleight, PA-C  methocarbamol (ROBAXIN) 500 MG tablet Take 1 tablet (500 mg total) by mouth every 6 (six) hours as needed for muscle spasms. Patient not taking: Reported on 09/08/2020 11/28/18   Roberto Scales D, MD  pantoprazole (PROTONIX) 40 MG tablet Take 1 tablet (40 mg total) by mouth daily. Patient not taking: Reported on September 08, 2020 11/29/18   [name], MD  traMADol Jane[facility] Sark) 50 MG tablet Take 1/2 to 1 tab po bid prn pain Patient not taking: Reported on 09/08/20 08/20/19   Cristie Hem, PA-C    Allergies    Bimatoprost, Brimonidine, and Timolol  Review of Systems   Review of Systems  Unable to perform ROS: Dementia    Physical Exam Updated Vital Signs BP (!) 177/64    Pulse (!) 102    Temp 98.3 F (36.8 C) (Rectal)    Resp 13    SpO2 100%   Physical Exam Constitutional:      General: She is not in acute distress.    Appearance: She is ill-appearing. She is not toxic-appearing or diaphoretic.     Comments: Patient looks cachectic, ill-appearing, unable to speak to me but is moving all 4 extremities.  Eyes closed, however patient is blind and this is baseline.  Cardiovascular:     Rate and Rhythm: Normal rate and regular rhythm.     Pulses: Normal pulses.  Pulmonary:     Effort: Pulmonary effort is normal.     Breath sounds: Normal breath sounds.     Comments: Hard mass on chest wall. Abdominal:     General: Abdomen is flat.      Palpations: Abdomen is soft.     Tenderness: There is abdominal tenderness. There is guarding.  Musculoskeletal:        General: Normal range of motion.     Right lower leg: Edema present.     Left lower leg: Edema present.  Skin:    General: Skin is warm and dry.     Capillary Refill: Capillary refill takes less than 2 seconds.  Neurological:     Mental Status: She is alert.  Comments: Patient is nonverbal, moving all 4 extremities.  Does not answer to speech, however does respond to pain.  GCS E(NT)V(1) M (4).  Unable to test eyes due to blindness.  Psychiatric:        Mood and Affect: Mood normal.        Behavior: Behavior normal.        Thought Content: Thought content normal.     ED Results / Procedures / Treatments   Labs (all labs ordered are listed, but only abnormal results are displayed) Labs Reviewed  CBC WITH DIFFERENTIAL/PLATELET - Abnormal; Notable for the following components:      Result Value   WBC 3.4 (*)    RBC 3.57 (*)    Hemoglobin 10.1 (*)    HCT 32.8 (*)    Platelets 101 (*)    Lymphs Abs 0.4 (*)    All other components within normal limits  COMPREHENSIVE METABOLIC PANEL - Abnormal; Notable for the following components:   Potassium 2.4 (*)    Glucose, Bld 773 (*)    BUN 33 (*)    Calcium 8.7 (*)    Total Protein 5.0 (*)    Albumin 2.9 (*)    GFR, Estimated 60 (*)    All other components within normal limits  LACTIC ACID, PLASMA - Abnormal; Notable for the following components:   Lactic Acid, Venous 2.1 (*)    All other components within normal limits  BRAIN NATRIURETIC PEPTIDE - Abnormal; Notable for the following components:   B Natriuretic Peptide 338.8 (*)    All other components within normal limits  CBG MONITORING, ED - Abnormal; Notable for the following components:   Glucose-Capillary 41 (*)    All other components within normal limits  I-STAT CHEM 8, ED - Abnormal; Notable for the following components:   Potassium 2.4 (*)    BUN 37  (*)    Glucose, Bld 536 (*)    Hemoglobin 10.2 (*)    HCT 30.0 (*)    All other components within normal limits  CBG MONITORING, ED - Abnormal; Notable for the following components:   Glucose-Capillary 49 (*)    All other components within normal limits  CBG MONITORING, ED - Abnormal; Notable for the following components:   Glucose-Capillary 474 (*)    All other components within normal limits  RESPIRATORY PANEL BY RT PCR (FLU A&B, COVID)  CULTURE, BLOOD (ROUTINE X 2)  CULTURE, BLOOD (ROUTINE X 2)  LIPASE, BLOOD  MAGNESIUM  LACTIC ACID, PLASMA  URINALYSIS, ROUTINE W REFLEX MICROSCOPIC  CBG MONITORING, ED  CBG MONITORING, ED  CBG MONITORING, ED    EKG EKG Interpretation  Date/Time:  Sunday 09/08/2020 20:43:09 EDT Ventricular Rate:  53 PR Interval:    QRS Duration: 111 QT Interval:  592 QTC Calculation: 556 R Axis:   -53 Text Interpretation: Sinus rhythm LAD, consider left anterior fascicular block Abnormal R-wave progression, early transition LVH with secondary repolarization abnormality Prolonged QT interval Confirmed by Tilden Fossa [phone]) on Sep 08, 2020 9:13:58 PM   Radiology CT Head Wo Contrast  Result Date: 09/08/20 CLINICAL DATA:  Delirium EXAM: CT HEAD WITHOUT CONTRAST TECHNIQUE: Contiguous axial images were obtained from the base of the skull through the vertex without intravenous contrast. COMPARISON:  None. FINDINGS: Brain: Advanced atrophy and chronic small vessel disease throughout the deep white matter. Associated ventriculomegaly. No acute intracranial abnormality. Specifically, no hemorrhage, hydrocephalus, mass lesion, acute infarction, or significant intracranial injury. Vascular: No hyperdense vessel or unexpected  calcification. Skull: No acute calvarial abnormality. Sinuses/Orbits: Visualized paranasal sinuses and mastoids clear. Orbital soft tissues unremarkable. Other: None IMPRESSION: Atrophy, chronic microvascular disease. No acute intracranial  abnormality. Electronically Signed   By: Charlett NoseKevin  Dover M.D.   On: 08/09/2020 22:40   CT ABDOMEN PELVIS W CONTRAST  Result Date: 07/31/2020 CLINICAL DATA:  Hypoglycemia. EXAM: CT ABDOMEN AND PELVIS WITH CONTRAST TECHNIQUE: Multidetector CT imaging of the abdomen and pelvis was performed using the standard protocol following bolus administration of intravenous contrast. CONTRAST:  80mL OMNIPAQUE IOHEXOL 300 MG/ML  SOLN COMPARISON:  None. FINDINGS: Lower chest: Mild atelectasis is seen within the posterior aspect of the left lung base. There is a small right pleural effusion. A 6.1 cm x 4.4 cm the heterogeneous well-defined soft tissue mass is seen within the anterior aspect of the lower chest wall, along the midline. Hepatobiliary: There is diffuse fatty infiltration of the liver no focal liver abnormality is seen. No gallstones, gallbladder wall thickening, or biliary dilatation. Pancreas: Unremarkable. No pancreatic ductal dilatation or surrounding inflammatory changes. Spleen: Normal in size without focal abnormality. Adrenals/Urinary Tract: Adrenal glands are unremarkable. Kidneys are normal in size, without renal calculi or hydronephrosis. A 1.3 cm x 0.9 cm ill-defined mildly hyperdense area is seen within the anterior aspect of the mid left kidney (axial CT image 20, CT series number 3/sagittal reformatted image 82, CT series number 7). The urinary bladder is moderately distended and limited in evaluation secondary to overlying streak artifact. Stomach/Bowel: Stomach is within normal limits. The appendix is not clearly identified. No evidence of bowel dilatation. Numerous decompressed loops of small bowel are suspected within the right abdomen. A very large amount of stool is seen within the distal sigmoid colon and rectum. Vascular/Lymphatic: There is marked severity tortuosity of the abdominal aorta, without evidence of aneurysmal dilatation or dissection. No enlarged abdominal or pelvic lymph nodes.  Reproductive: Status post hysterectomy. 2.4 cm x 2.0 cm and 2.5 cm x 2.3 cm well-defined areas of low attenuation are seen along the expected region of the left adnexa (axial CT images 48 through 53, CT series number 3/coronal reformatted images 26 through 38, CT series number 6). Other: No abdominal wall hernia or abnormality. There is a mild amount of abdominopelvic ascites. Musculoskeletal: Bilateral total hip replacements are seen with associated streak artifact and subsequently limited evaluation of the adjacent osseous and soft tissue structures. Moderate severity dextroscoliosis of the lumbar spine is seen with multilevel degenerative changes. IMPRESSION: 1. Heterogeneous soft tissue mass within the anterior aspect of the lower chest wall, concerning for the presence of an underlying neoplastic process. 2. Small right pleural effusion. 3. Mild amount of abdominopelvic ascites. 4. Fatty liver. 5. Bilateral total hip replacements. 6. Very large amount of stool within the distal sigmoid colon and rectum. 7. Left adnexal masses which may represent large, complex ovarian cysts. Correlation with pelvic ultrasound is recommended. 8. Small hyperdense area within the left kidney which may represent a small renal mass. MRI correlation is recommended. Electronically Signed   By: Aram Candelahaddeus  Houston M.D.   On: 08/20/2020 23:00   DG Chest Port 1 View  Result Date: 08/14/2020 CLINICAL DATA:  Shortness of breath EXAM: PORTABLE CHEST 1 VIEW COMPARISON:  11/23/2018 FINDINGS: Cardiomegaly. There is hyperinflation of the lungs compatible with COPD. No confluent opacities or effusions. No acute bony abnormality. IMPRESSION: Cardiomegaly, COPD. No active disease. Electronically Signed   By: Charlett NoseKevin  Dover M.D.   On: 08/19/2020 21:33    Procedures Procedures (including  critical care time)  Medications Ordered in ED Medications  potassium chloride 10 mEq in 100 mL IVPB (10 mEq Intravenous New Bag/Given 08/07/2020 2237)   dextrose (D10W) 10% bolus 500 mL (0 mLs Intravenous Stopped 08/12/2020 2145)  dextrose 50 % solution 50 mL (50 mLs Intravenous Given 08/12/2020 1942)  iohexol (OMNIPAQUE) 300 MG/ML solution 100 mL (80 mLs Intravenous Contrast Given 08/06/2020 2215)  lactated ringers bolus 500 mL (500 mLs Intravenous New Bag/Given 08/15/2020 2236)    ED Course  I have reviewed the triage vital signs and the nursing notes.  Pertinent labs & imaging results that were available during my care of the patient were reviewed by me and considered in my medical decision making (see chart for details).    MDM Rules/Calculators/A&P                         ANGE PUSKAS is a 86 y.o. female with pertinent medical history of severe Alzheimer's disease, blindness, hypertension presents emerged department today via EMS for weakness.  Patient placed on 2 L since she was 87% on room air, at 2 L she is 100%.  No respiratory distress.  When patient arrived sugar was 41, did push D50 sugar remained at 49,  will also start 500 mL of D10 at this time and reevaluate.  Patient also has new oxygen requirement, will obtain basic sepsis work-up however will not call code sepsis this time since patient does not meet requirement.  Also scan head and abdomen pelvis since patient does guard when I push on her stomach.  Patient will most likely need to come in. Rectal temp 98.3.  Work-up today remarkable for leukopenia of 3.4, hemoglobin of 10.1.  Potassium of 2.4, this is being repleted.  Lactic acid 2.1, this could be from dehydration we will give lactated Ringer's at this time and await further second lactic acid.  Patient with slightly elevated BNP, chest x-ray does show COPD and cardiomegaly.  CT head without any acute intracranial abnormality, CT abdomen pelvis does show questionable neoplastic process of the chest.  When I spoke to niece about this, they state that they were aware of this however never got a biopsy because of her  Alzheimer's.  She was never diagnosed with cancer.  Niece does not want her to be a hospice patient.  Sugars have gone up, in the 400s.  Patient's neuro exam remains unchanged.  Patient most likely needs to come in for altered mental status, electrolyte abnormalities and weakness.  1130 spoke to Dr.Zierle-Ghosh who will admit the patient.  The patient appears reasonably stabilized for admission considering the current resources, flow, and capabilities available in the ED at this time, and I doubt any other [facility] requiring further screening and/or treatment in the ED prior to admission.  I discussed this case with my attending physician who cosigned this note including patient's presenting symptoms, physical exam, and planned diagnostics and interventions. Attending physician stated agreement with plan or made changes to plan which were implemented.   Attending physician assessed patient at bedside.  Final Clinical Impression(s) / ED Diagnoses Final diagnoses:  Hypoglycemia  Altered mental status, unspecified altered mental status type  Hypokalemia    Rx / DC Orders ED Discharge Orders    None       Farrel Gordon, PA-C 08/01/2020 2340    Tilden Fossa, MD 08/22/20 2333

## 2020-08-22 ENCOUNTER — Encounter (HOSPITAL_COMMUNITY): Payer: Self-pay | Admitting: Family Medicine

## 2020-08-22 ENCOUNTER — Observation Stay (HOSPITAL_COMMUNITY): Payer: Medicare PPO

## 2020-08-22 DIAGNOSIS — F0280 Dementia in other diseases classified elsewhere without behavioral disturbance: Secondary | ICD-10-CM | POA: Diagnosis present

## 2020-08-22 DIAGNOSIS — R4182 Altered mental status, unspecified: Secondary | ICD-10-CM | POA: Diagnosis present

## 2020-08-22 DIAGNOSIS — I5023 Acute on chronic systolic (congestive) heart failure: Secondary | ICD-10-CM | POA: Diagnosis present

## 2020-08-22 DIAGNOSIS — E872 Acidosis: Secondary | ICD-10-CM | POA: Diagnosis present

## 2020-08-22 DIAGNOSIS — E162 Hypoglycemia, unspecified: Secondary | ICD-10-CM

## 2020-08-22 DIAGNOSIS — R64 Cachexia: Secondary | ICD-10-CM | POA: Diagnosis present

## 2020-08-22 DIAGNOSIS — L89322 Pressure ulcer of left buttock, stage 2: Secondary | ICD-10-CM | POA: Diagnosis present

## 2020-08-22 DIAGNOSIS — Z681 Body mass index (BMI) 19 or less, adult: Secondary | ICD-10-CM | POA: Diagnosis not present

## 2020-08-22 DIAGNOSIS — F0390 Unspecified dementia without behavioral disturbance: Secondary | ICD-10-CM

## 2020-08-22 DIAGNOSIS — D638 Anemia in other chronic diseases classified elsewhere: Secondary | ICD-10-CM | POA: Diagnosis present

## 2020-08-22 DIAGNOSIS — E785 Hyperlipidemia, unspecified: Secondary | ICD-10-CM | POA: Diagnosis present

## 2020-08-22 DIAGNOSIS — L8990 Pressure ulcer of unspecified site, unspecified stage: Secondary | ICD-10-CM | POA: Insufficient documentation

## 2020-08-22 DIAGNOSIS — I639 Cerebral infarction, unspecified: Secondary | ICD-10-CM | POA: Diagnosis not present

## 2020-08-22 DIAGNOSIS — Z7189 Other specified counseling: Secondary | ICD-10-CM

## 2020-08-22 DIAGNOSIS — I63311 Cerebral infarction due to thrombosis of right middle cerebral artery: Secondary | ICD-10-CM | POA: Diagnosis present

## 2020-08-22 DIAGNOSIS — Z515 Encounter for palliative care: Secondary | ICD-10-CM | POA: Diagnosis not present

## 2020-08-22 DIAGNOSIS — G9389 Other specified disorders of brain: Secondary | ICD-10-CM | POA: Diagnosis not present

## 2020-08-22 DIAGNOSIS — Z66 Do not resuscitate: Secondary | ICD-10-CM | POA: Diagnosis not present

## 2020-08-22 DIAGNOSIS — I2699 Other pulmonary embolism without acute cor pulmonale: Secondary | ICD-10-CM | POA: Diagnosis present

## 2020-08-22 DIAGNOSIS — G319 Degenerative disease of nervous system, unspecified: Secondary | ICD-10-CM | POA: Diagnosis not present

## 2020-08-22 DIAGNOSIS — I6389 Other cerebral infarction: Secondary | ICD-10-CM

## 2020-08-22 DIAGNOSIS — E876 Hypokalemia: Secondary | ICD-10-CM | POA: Diagnosis present

## 2020-08-22 DIAGNOSIS — Z20822 Contact with and (suspected) exposure to covid-19: Secondary | ICD-10-CM | POA: Diagnosis present

## 2020-08-22 DIAGNOSIS — G9341 Metabolic encephalopathy: Secondary | ICD-10-CM | POA: Diagnosis present

## 2020-08-22 DIAGNOSIS — R29818 Other symptoms and signs involving the nervous system: Secondary | ICD-10-CM | POA: Diagnosis not present

## 2020-08-22 DIAGNOSIS — G309 Alzheimer's disease, unspecified: Secondary | ICD-10-CM | POA: Diagnosis present

## 2020-08-22 DIAGNOSIS — I110 Hypertensive heart disease with heart failure: Secondary | ICD-10-CM | POA: Diagnosis present

## 2020-08-22 DIAGNOSIS — D61818 Other pancytopenia: Secondary | ICD-10-CM | POA: Diagnosis present

## 2020-08-22 DIAGNOSIS — R54 Age-related physical debility: Secondary | ICD-10-CM | POA: Diagnosis present

## 2020-08-22 DIAGNOSIS — E860 Dehydration: Secondary | ICD-10-CM | POA: Diagnosis present

## 2020-08-22 DIAGNOSIS — L89153 Pressure ulcer of sacral region, stage 3: Secondary | ICD-10-CM | POA: Diagnosis present

## 2020-08-22 LAB — CBC WITH DIFFERENTIAL/PLATELET
Abs Immature Granulocytes: 0.01 10*3/uL (ref 0.00–0.07)
Basophils Absolute: 0 10*3/uL (ref 0.0–0.1)
Basophils Relative: 0 %
Eosinophils Absolute: 0 10*3/uL (ref 0.0–0.5)
Eosinophils Relative: 0 %
HCT: 35.5 % — ABNORMAL LOW (ref 36.0–46.0)
Hemoglobin: 11.2 g/dL — ABNORMAL LOW (ref 12.0–15.0)
Immature Granulocytes: 0 %
Lymphocytes Relative: 11 %
Lymphs Abs: 0.4 10*3/uL — ABNORMAL LOW (ref 0.7–4.0)
MCH: 28.6 pg (ref 26.0–34.0)
MCHC: 31.5 g/dL (ref 30.0–36.0)
MCV: 90.6 fL (ref 80.0–100.0)
Monocytes Absolute: 0.2 10*3/uL (ref 0.1–1.0)
Monocytes Relative: 4 %
Neutro Abs: 3.2 10*3/uL (ref 1.7–7.7)
Neutrophils Relative %: 85 %
Platelets: 122 10*3/uL — ABNORMAL LOW (ref 150–400)
RBC: 3.92 MIL/uL (ref 3.87–5.11)
RDW: 15.1 % (ref 11.5–15.5)
WBC: 3.8 10*3/uL — ABNORMAL LOW (ref 4.0–10.5)
nRBC: 0 % (ref 0.0–0.2)

## 2020-08-22 LAB — GLUCOSE, CAPILLARY
Glucose-Capillary: 75 mg/dL (ref 70–99)
Glucose-Capillary: 141 mg/dL — ABNORMAL HIGH (ref 70–99)
Glucose-Capillary: 38 mg/dL — CL (ref 70–99)
Glucose-Capillary: 35 mg/dL — CL (ref 70–99)
Glucose-Capillary: 19 mg/dL — CL (ref 70–99)
Glucose-Capillary: 224 mg/dL — ABNORMAL HIGH (ref 70–99)

## 2020-08-22 LAB — COMPREHENSIVE METABOLIC PANEL
ALT: 11 U/L (ref 0–44)
AST: 30 U/L (ref 15–41)
Albumin: 3.2 g/dL — ABNORMAL LOW (ref 3.5–5.0)
Alkaline Phosphatase: 60 U/L (ref 38–126)
Anion gap: 10 (ref 5–15)
BUN: 31 mg/dL — ABNORMAL HIGH (ref 8–23)
CO2: 29 mmol/L (ref 22–32)
Calcium: 8.7 mg/dL — ABNORMAL LOW (ref 8.9–10.3)
Chloride: 106 mmol/L (ref 98–111)
Creatinine, Ser: 0.82 mg/dL (ref 0.44–1.00)
GFR, Estimated: >60 mL/min (ref >60)
Glucose, Bld: 220 mg/dL — ABNORMAL HIGH (ref 70–99)
Potassium: 2.8 mmol/L — ABNORMAL LOW (ref 3.5–5.1)
Sodium: 145 mmol/L (ref 135–145)
Total Bilirubin: 1 mg/dL (ref 0.3–1.2)
Total Protein: 5.5 g/dL — ABNORMAL LOW (ref 6.5–8.1)

## 2020-08-22 LAB — URINALYSIS, ROUTINE W REFLEX MICROSCOPIC
Bilirubin Urine: NEGATIVE
Glucose, UA: >=500 mg/dL — AB
Ketones, ur: NEGATIVE mg/dL
Leukocytes,Ua: NEGATIVE
Nitrite: NEGATIVE
Protein, ur: NEGATIVE mg/dL
Specific Gravity, Urine: 1.011 (ref 1.005–1.030)
pH: 6 (ref 5.0–8.0)

## 2020-08-22 LAB — BASIC METABOLIC PANEL
Anion gap: 10 (ref 5–15)
BUN: 21 mg/dL (ref 8–23)
CO2: 28 mmol/L (ref 22–32)
Calcium: 8.1 mg/dL — ABNORMAL LOW (ref 8.9–10.3)
Chloride: 102 mmol/L (ref 98–111)
Creatinine, Ser: 0.64 mg/dL (ref 0.44–1.00)
GFR, Estimated: >60 mL/min (ref >60)
Glucose, Bld: 285 mg/dL — ABNORMAL HIGH (ref 70–99)
Potassium: 3.7 mmol/L (ref 3.5–5.1)
Sodium: 140 mmol/L (ref 135–145)

## 2020-08-22 LAB — ECHOCARDIOGRAM COMPLETE
AR max vel: 1.91 cm2
AV Area VTI: 2.05 cm2
AV Area mean vel: 2.03 cm2
AV Mean grad: 2 mmHg
AV Peak grad: 5.1 mmHg
Ao pk vel: 1.13 m/s
Calc EF: 35.2 %
P 1/2 time: 535 msec
S' Lateral: 3.1 cm
Single Plane A2C EF: 34.8 %
Single Plane A4C EF: 34.1 %

## 2020-08-22 LAB — TSH: TSH: 3.546 u[IU]/mL (ref 0.350–4.500)

## 2020-08-22 LAB — CBG MONITORING, ED
Glucose-Capillary: 100 mg/dL — ABNORMAL HIGH (ref 70–99)
Glucose-Capillary: 106 mg/dL — ABNORMAL HIGH (ref 70–99)
Glucose-Capillary: 19 mg/dL — CL (ref 70–99)

## 2020-08-22 LAB — LACTIC ACID, PLASMA
Lactic Acid, Venous: 4.7 mmol/L (ref 0.5–1.9)
Lactic Acid, Venous: 4.7 mmol/L (ref 0.5–1.9)

## 2020-08-22 LAB — POTASSIUM: Potassium: 3.3 mmol/L — ABNORMAL LOW (ref 3.5–5.1)

## 2020-08-22 LAB — HEMOGLOBIN A1C
Hgb A1c MFr Bld: 5.2 % (ref 4.8–5.6)
Mean Plasma Glucose: 102.54 mg/dL

## 2020-08-22 LAB — AMMONIA: Ammonia: 24 umol/L (ref 9–35)

## 2020-08-22 LAB — MAGNESIUM: Magnesium: 2.2 mg/dL (ref 1.7–2.4)

## 2020-08-22 LAB — CORTISOL: Cortisol, Plasma: 28.8 ug/dL

## 2020-08-22 MED ORDER — SODIUM CHLORIDE 0.9 % IV SOLN: INTRAVENOUS | Status: DC

## 2020-08-22 MED ORDER — DEXTROSE 50 % IV SOLN
INTRAVENOUS | Status: AC
  Filled 2020-08-22: qty 50

## 2020-08-22 MED ORDER — POLYETHYLENE GLYCOL 3350 17 G PO PACK: 17.0000 g | PACK | Freq: Every day | ORAL | Status: DC | PRN

## 2020-08-22 MED ORDER — SODIUM CHLORIDE 0.9 % IV SOLN: 2.0000 g | Freq: Two times a day (BID) | INTRAVENOUS | Status: DC

## 2020-08-22 MED ORDER — ENOXAPARIN SODIUM 40 MG/0.4ML ~~LOC~~ SOLN: 40.0000 mg | Freq: Two times a day (BID) | SUBCUTANEOUS | Status: DC

## 2020-08-22 MED ORDER — SENNOSIDES-DOCUSATE SODIUM 8.6-50 MG PO TABS: 2.0000 | ORAL_TABLET | Freq: Two times a day (BID) | ORAL | Status: DC

## 2020-08-22 MED ORDER — ASPIRIN EC 325 MG PO TBEC
325.0000 mg | DELAYED_RELEASE_TABLET | Freq: Every day | ORAL | Status: DC
  Filled 2020-08-22: qty 1

## 2020-08-22 MED ORDER — HEPARIN SODIUM (PORCINE) 5000 UNIT/ML IJ SOLN
5000.0000 [IU] | Freq: Three times a day (TID) | INTRAMUSCULAR | Status: DC
  Administered 2020-08-22 (×2): 5000 [IU] via SUBCUTANEOUS
  Filled 2020-08-22 (×2): qty 1

## 2020-08-22 MED ORDER — ADULT MULTIVITAMIN W/MINERALS CH: 1.0000 | ORAL_TABLET | Freq: Every day | ORAL | Status: DC

## 2020-08-22 MED ORDER — DEXTROSE 50 % IV SOLN
INTRAVENOUS | Status: AC
  Administered 2020-08-22: 50 mL
  Filled 2020-08-22: qty 50

## 2020-08-22 MED ORDER — ACETAMINOPHEN 650 MG RE SUPP: 650.0000 mg | Freq: Four times a day (QID) | RECTAL | Status: DC | PRN

## 2020-08-22 MED ORDER — ENSURE ENLIVE PO LIQD
237.0000 mL | Freq: Two times a day (BID) | ORAL | Status: DC
  Filled 2020-08-22 (×2): qty 237

## 2020-08-22 MED ORDER — DEXTROSE 50 % IV SOLN
25.0000 mL | INTRAVENOUS | Status: DC
  Administered 2020-08-23 – 2020-08-24 (×16): 25 mL via INTRAVENOUS
  Filled 2020-08-22 (×8): qty 50

## 2020-08-22 MED ORDER — POTASSIUM CHLORIDE 10 MEQ/100ML IV SOLN
10.0000 meq | INTRAVENOUS | Status: AC
  Administered 2020-08-22 (×6): 10 meq via INTRAVENOUS
  Filled 2020-08-22 (×6): qty 100

## 2020-08-22 MED ORDER — POTASSIUM CHLORIDE 10 MEQ/100ML IV SOLN
10.0000 meq | INTRAVENOUS | Status: AC
  Administered 2020-08-22 (×3): 10 meq via INTRAVENOUS
  Filled 2020-08-22 (×3): qty 100

## 2020-08-22 MED ORDER — DEXTROSE 50 % IV SOLN
1.0000 | Freq: Once | INTRAVENOUS | Status: AC
  Administered 2020-08-22: 50 mL via INTRAVENOUS

## 2020-08-22 MED ORDER — IOHEXOL 350 MG/ML SOLN
100.0000 mL | Freq: Once | INTRAVENOUS | Status: AC | PRN
  Administered 2020-08-22: 100 mL via INTRAVENOUS

## 2020-08-22 MED ORDER — HYDROCORTISONE NA SUCCINATE PF 100 MG IJ SOLR
50.0000 mg | Freq: Three times a day (TID) | INTRAMUSCULAR | Status: DC
  Administered 2020-08-22 – 2020-08-24 (×6): 50 mg via INTRAVENOUS
  Filled 2020-08-22 (×7): qty 1

## 2020-08-22 MED ORDER — ONDANSETRON HCL 4 MG/2ML IJ SOLN: 4.0000 mg | Freq: Four times a day (QID) | INTRAMUSCULAR | Status: DC | PRN

## 2020-08-22 MED ORDER — PANTOPRAZOLE SODIUM 40 MG PO TBEC: 40.0000 mg | DELAYED_RELEASE_TABLET | Freq: Every day | ORAL | Status: DC

## 2020-08-22 MED ORDER — DEXTROSE-NACL 5-0.45 % IV SOLN: INTRAVENOUS | Status: DC

## 2020-08-22 MED ORDER — LACTULOSE 10 GM/15ML PO SOLN: 20.0000 g | Freq: Two times a day (BID) | ORAL | Status: DC

## 2020-08-22 MED ORDER — INSULIN ASPART 100 UNIT/ML ~~LOC~~ SOLN
0.0000 [IU] | Freq: Three times a day (TID) | SUBCUTANEOUS | Status: DC
  Administered 2020-08-23: 1 [IU] via SUBCUTANEOUS
  Filled 2020-08-22: qty 0.09

## 2020-08-22 MED ORDER — VANCOMYCIN HCL 750 MG/150ML IV SOLN
750.0000 mg | Freq: Once | INTRAVENOUS | Status: AC
  Administered 2020-08-22: 750 mg via INTRAVENOUS
  Filled 2020-08-22 (×2): qty 150

## 2020-08-22 MED ORDER — ACETAMINOPHEN 325 MG PO TABS: 650.0000 mg | ORAL_TABLET | Freq: Four times a day (QID) | ORAL | Status: DC | PRN

## 2020-08-22 MED ORDER — ONDANSETRON HCL 4 MG PO TABS: 4.0000 mg | ORAL_TABLET | Freq: Four times a day (QID) | ORAL | Status: DC | PRN

## 2020-08-22 MED ORDER — METHOCARBAMOL 500 MG PO TABS: 500.0000 mg | ORAL_TABLET | Freq: Four times a day (QID) | ORAL | Status: DC | PRN

## 2020-08-22 MED ORDER — GADOBUTROL 1 MMOL/ML IV SOLN
4.5000 mL | Freq: Once | INTRAVENOUS | Status: AC | PRN
  Administered 2020-08-22: 4.5 mL via INTRAVENOUS

## 2020-08-22 MED ORDER — HYDROCODONE-ACETAMINOPHEN 5-325 MG PO TABS: 1.0000 | ORAL_TABLET | Freq: Four times a day (QID) | ORAL | Status: DC | PRN

## 2020-08-22 MED ORDER — SODIUM CHLORIDE 0.9 % IV SOLN
2.0000 g | INTRAVENOUS | Status: AC
  Administered 2020-08-22: 2 g via INTRAVENOUS
  Filled 2020-08-22: qty 2

## 2020-08-22 MED ORDER — ASPIRIN 300 MG RE SUPP
300.0000 mg | Freq: Every day | RECTAL | Status: DC
  Administered 2020-08-22 – 2020-08-25 (×4): 300 mg via RECTAL
  Filled 2020-08-22 (×4): qty 1

## 2020-08-22 NOTE — Progress Notes (Signed)
SLP Cancellation Note  Patient Details Name: Erica Vasquez MRN: [id number] DOB: 07-Nov-1929   Cancelled treatment:       Reason Eval/Treat Not Completed: Patient's level of consciousness. Spoke with patient's RN, who stated that patient has been minimally alert and only active movement she has observed is patient sucking on fingers. SLP will check next date regarding ability of patient to complete BSE.   Angela Nevin, MA, CCC-SLP Speech Therapy

## 2020-08-22 NOTE — Progress Notes (Signed)
BAER Hugger applied, pt has eyes open at times, sucking fingers L hand

## 2020-08-22 NOTE — Progress Notes (Signed)
Chaplain responded to spiritual consult.  Patient blind, not communicative. Son bedside has been caring for his mother for many years. He is an only child. Not married, has a daughter in her mid-thirties in [place].  He was raised by his grandparents until he was 57 and then his mother raised him. He had successful life in [place] before moving back to [place].  During the visit, if the patient moved, he would jump up and move the blanket up to her neck again. He showed pictures of her when she was home, sitting in a chair or bedside. He said that they were churchgoers and that "people aren't religious anymore."  He talked about the miracle of Ross Marcus and how he believed his mother could be healed.  However he also said he was aware of human frailty and that he did not want his mother "to be on life support."  Chaplain offered ministry of presence and prayer. Rev. Lynnell Chad Pager [phone]

## 2020-08-22 NOTE — Progress Notes (Signed)
   08/22/20 2046  Assess: MEWS Score  Temp (!) 92.9 F (33.8 C)  BP 117/60  Pulse Rate 72  Resp (!) 8  SpO2 100 %  O2 Device Nasal Cannula  O2 Flow Rate (L/min) 2.5 L/min  Assess: MEWS Score  MEWS Temp 2  MEWS Systolic 0  MEWS Pulse 0  MEWS RR 1  MEWS LOC 1  MEWS Score 4  MEWS Score Color Red  Assess: if the MEWS score is Yellow or Red  Were vital signs taken at a resting state? Yes  Focused Assessment No change from prior assessment  Early Detection of Sepsis Score *See Row Information* Low  MEWS guidelines implemented *See Row Information* No, other (Comment) (comfort care )  Treat  MEWS Interventions Other (Comment) (comfort care )  Pain Scale PAINAD  Pain Score 0  Escalate  MEWS: Escalate Red: discuss with charge nurse/RN and provider, consider discussing with RRT  Notify: Charge Nurse/RN  Name of Charge Nurse/RN Notified Renee R RN   Date Charge Nurse/RN Notified 08/22/20  Time Charge Nurse/RN Notified 2055  Pt is a DNR / comfort care at this time

## 2020-08-22 NOTE — Significant Event (Addendum)
Rapid Response Event Note   Reason for Call :  Called by bedside RN for HR in the 50s, undetectable temp, and altered mental status   Initial Focused Assessment:  Neuro: Pt baseline dementia, only withdrawals to pain. Nonverbal Resp: Lung sounds clear but diminished, shallow breaths. RR 18 Cardiac: S1 and S2 heard. BP 142/64. HR 57, sinus brady.   Interventions:  MD Gherghe paged. MD aware of patient's status. Called son to discuss goals of care.  Temp still undetectable rectally. Bair hugger applied CBG read at 19. Result confirmed with BMP. Glucose on BMP 284.  Plan of Care:  MD discussed with son, and DNR order placed Transfer to progressive in order to stabilize temp and have closer monitoring  [facility] notified of order for progressive   Event Summary:   MD Notified: Dr. Elvera Lennox Call Time: 1840 Arrival Time: 1843 End Time: 1925  Myra Rude, RN

## 2020-08-22 NOTE — H&P (Signed)
TRH H&P    Patient Demographics:    Erica Vasquez, is a 86 y.o. female  MRN: [id number]  DOB - 1930-06-06  Admit Date - 08/13/2020  Referring MD/NP/PA: Janyce Llanos  Outpatient Primary MD for the patient is Mirna Mires, MD  Patient coming from: Home  Chief complaint- altered mental status   HPI:    Erica Vasquez  is a 86 y.o. female, with history of hypertension, glaucoma, dementia, arthritis, Alzheimer's disease presents to the ER today with her niece at the bedside for chief complaint of mental status change.  Patient is not able to provide any history whatsoever secondary to severe dementia.  Niece reports that patient was not eating today and was not talking.  Usually patient does talk, but is incoherent.  Today, she was just holding her food and liquid in her mouth, without swallowing and not talking at all, so niece called EMS.  When EMS arrived they found patient's glucose to be in the 40s.  They gave 500 mL of D10.  They reported the glucose improved to 200s.  When she arrived in the ER her glucose was again in the 40s.  She was given an amp of D50.  Her glucose was then reading 700s, 500s, now in the 400s.  Mental status did not improve with glucose.  Patient reports the patient has not had any kind of infectious symptoms leading up till today.  She has had no fever, no nausea, no vomiting, no diarrhea, they have not noticed malodorous urine.  They have not noticed a cough, or any trouble breathing.  Patient is fully vaccinated for Covid.  Niece and son report the patient was in her normal state of health up until today when there was an acute change.  Niece and son expect patient to return to her normal state of health, walking and talking after discharge.  I have tried to discuss with them that patient is nearing her end of life.  We have discussed CODE STATUS extensively, but they report that they need more  time to think about it and for now would like her to be full code.    In the ED T 98.3, HR 50, R13, BP 177/64 Maintaining sats on room air WBC 3.4, Hgb 10.1 CMP 144, 2.4, Cl 102, Bicarb 26, BUN 37, Cr 0.9, Glucose 700s, 500s, 400s Mag 2.1 BNP 338 Lactic Acid 2.1 Blood cultures CXR - Cardiomegaly + COPD EKG HR 53, SR, QTC 556 CT abdomen - Soft tissue mass chest wall (family aware). Sm. Right pleural effusion. Mild ascites. Fatty liver, large stool burden, sm renal mass (rec'ing MRI) K+ IV LR 500 UA pending     Review of systems:    Review of Systems  Unable to perform ROS: Dementia       Past History of the following :    Past Medical History:  Diagnosis Date  . Alzheimer disease (HCC)    mild  . Arthritis   . Blind   . Cataract   . Cataract cortical, senile   .  Dementia (HCC)   . Essential hypertension   . Glaucoma   . Hypertension       Past Surgical History:  Procedure Laterality Date  . ABDOMINAL HYSTERECTOMY    . CATARACT EXTRACTION    . GLAUCOMA SURGERY Bilateral 2010   SLT  . HIP ARTHROPLASTY Left 11/23/2018   Procedure: ARTHROPLASTY BIPOLAR HIP (HEMIARTHROPLASTY);  Surgeon: Eldred Manges, MD;  Location: [facility] OR;  Service: Orthopedics;  Laterality: Left;  . TOTAL HIP ARTHROPLASTY Right 01/26/2018   Procedure: TOTAL HIP ARTHROPLASTY ANTERIOR APPROACH;  Surgeon: Tarry Kos, MD;  Location: [facility] OR;  Service: Orthopedics;  Laterality: Right;      Social History:      Social History   Tobacco Use  . Smoking status: Never Smoker  . Smokeless tobacco: Never Used  Substance Use Topics  . Alcohol use: Not Currently       Family History :     Family History  Problem Relation Age of Onset  . Dementia Mother   . Stroke Mother   . Dementia Father   . Glaucoma Father   . Cataracts Father   . Hypertension Father   . Cataracts Sister   . Glaucoma Sister   . Hypertension Sister   . Retinal detachment Sister       Home Medications:    Prior to Admission medications   Medication Sig Start Date End Date Taking? Authorizing Provider  Acetamin Chew Tab & Susp (TYLENOL CHILDRENS) 160 & 160 MG &MG/5ML THPK Take 30 mLs by mouth 2 (two) times daily as needed (pain).   Yes [provider]  amLODipine (NORVASC) 2.5 MG tablet Take 1 tablet (2.5 mg total) by mouth daily. 01/30/18 September 03, 2020 Yes Arrien, York Ram, MD  lactulose [facility]) 10 GM/15ML solution Take 20 g by mouth 2 (two) times daily. 08/09/20  Yes [provider]  aspirin EC 325 MG EC tablet Take 1 tablet (325 mg total) by mouth daily with breakfast. Patient not taking: Reported on 03-Sep-2020 11/25/18   Naida Sleight, PA-C  celecoxib (CELEBREX) 200 MG capsule Take 1 capsule (200 mg total) by mouth 2 (two) times daily. Patient not taking: Reported on 09-03-20 08/21/19   Cristie Hem, PA-C  docusate sodium (COLACE) 100 MG capsule Take 1 capsule (100 mg total) by mouth 2 (two) times daily. Patient not taking: Reported on 03-Sep-2020 11/28/18   Roberto Scales D, MD  HYDROcodone-acetaminophen (NORCO/VICODIN) 5-325 MG tablet Take 1 tablet by mouth every 6 (six) hours as needed for moderate pain (pain score 4-6). Patient not taking: Reported on 09-03-20 11/25/18   Naida Sleight, PA-C  methocarbamol (ROBAXIN) 500 MG tablet Take 1 tablet (500 mg total) by mouth every 6 (six) hours as needed for muscle spasms. Patient not taking: Reported on September 03, 2020 11/28/18   Roberto Scales D, MD  pantoprazole (PROTONIX) 40 MG tablet Take 1 tablet (40 mg total) by mouth daily. Patient not taking: Reported on 09-03-2020 11/29/18   [name], MD  traMADol Janean Sark) 50 MG tablet Take 1/2 to 1 tab po bid prn pain Patient not taking: Reported on Sep 03, 2020 08/20/19   Cristie Hem, PA-C     Allergies:     Allergies  Allergen Reactions  . Bimatoprost Itching  . Brimonidine Itching  . Timolol Itching     Physical Exam:   Vitals  Blood pressure (!) 175/58,  pulse (!) 50, temperature 98.3 F (36.8 C), temperature source Rectal, resp. rate 14, SpO2 98 %.  1.  General: Lying supine in bed in no acute distress, frail  2. Psychiatric: Unable to assess as patient is nonverbal  3. Neurologic: Unable to assess as patient is not following commands, she does withdraw all 4 extremities to cold touch  4. HEENMT:  Head is atraumatic, normocephalic, neck is cachectic, trachea is midline, mucous membranes are dry  5. Respiratory : Lungs are clear to auscultation bilaterally, no wheeze rhonchi or rales No cyanosis  6. Cardiovascular : Heart rate is bradycardic, rhythm is regular, no murmurs rubs or gallops  7. Gastrointestinal:  Abdomen is soft, nondistended, nontender to palpation  8. Skin:  No acute lesions on limited skin exam  9.Musculoskeletal:  2+ pitting edema in the lower extremities just past the ankles, atrophy of muscles Arthritic changes in both knees, and joints of hands No acute deformity    Data Review:    CBC Recent Labs  Lab 08/10/2020 2035 07/25/2020 2130  WBC 3.4*  --   HGB 10.1* 10.2*  HCT 32.8* 30.0*  PLT 101*  --   MCV 91.9  --   MCH 28.3  --   MCHC 30.8  --   RDW 15.1  --   LYMPHSABS 0.4*  --   MONOABS 0.1  --   EOSABS 0.0  --   BASOSABS 0.0  --    ------------------------------------------------------------------------------------------------------------------  Results for orders placed or performed during the hospital encounter of 07/31/2020 (from the past 48 hour(s))  CBG monitoring, ED     Status: Abnormal   Collection Time: 08/15/2020  7:24 PM  Result Value Ref Range   Glucose-Capillary 41 (LL) 70 - 99 mg/dL    Comment: Glucose reference range applies only to samples taken after fasting for at least 8 hours.   Comment 1 Notify RN   POC CBG, ED     Status: Abnormal   Collection Time: 08/20/2020  8:06 PM  Result Value Ref Range   Glucose-Capillary 49 (L) 70 - 99 mg/dL    Comment: Glucose reference range  applies only to samples taken after fasting for at least 8 hours.  CBC with Differential     Status: Abnormal   Collection Time: 08/14/2020  8:35 PM  Result Value Ref Range   WBC 3.4 (L) 4.0 - 10.5 K/uL   RBC 3.57 (L) 3.87 - 5.11 MIL/uL   Hemoglobin 10.1 (L) 12.0 - 15.0 g/dL   HCT 16.1 (L) 36 - 46 %   MCV 91.9 80.0 - 100.0 fL   MCH 28.3 26.0 - 34.0 pg   MCHC 30.8 30.0 - 36.0 g/dL   RDW 09.6 04.5 - 40.9 %   Platelets 101 (L) 150 - 400 K/uL    Comment: REPEATED TO VERIFY PLATELET COUNT CONFIRMED BY SMEAR SPECIMEN CHECKED FOR CLOTS Immature Platelet Fraction may be clinically indicated, consider ordering this additional test [id number]    nRBC 0.0 0.0 - 0.2 %   Neutrophils Relative % 86 %   Neutro Abs 2.9 1.7 - 7.7 K/uL   Lymphocytes Relative 11 %   Lymphs Abs 0.4 (L) 0.7 - 4.0 K/uL   Monocytes Relative 3 %   Monocytes Absolute 0.1 0.1 - 1.0 K/uL   Eosinophils Relative 0 %   Eosinophils Absolute 0.0 0.0 - 0.5 K/uL   Basophils Relative 0 %   Basophils Absolute 0.0 0.0 - 0.1 K/uL   Immature Granulocytes 0 %   Abs Immature Granulocytes 0.01 0.00 - 0.07 K/uL    Comment: Performed at [facility], [address] [place], [place] [address]  Comprehensive metabolic panel     Status: Abnormal   Collection Time: 08-25-20  8:35 PM  Result Value Ref Range   Sodium 140 135 - 145 mmol/L   Potassium 2.4 (LL) 3.5 - 5.1 mmol/L    Comment: CRITICAL RESULT CALLED TO, READ BACK BY AND VERIFIED WITH: J.NASH,RN [id number] @2141  BY V.WILKINS    Chloride 103 98 - 111 mmol/L   CO2 26 22 - 32 mmol/L   Glucose, Bld 773 (HH) 70 - 99 mg/dL    Comment: Glucose reference range applies only to samples taken after fasting for at least 8 hours. CRITICAL RESULT CALLED TO, READ BACK BY AND VERIFIED WITH: J.NASH,RN @2141  BY V.WILKINS    BUN 33 (H) 8 - 23 mg/dL   Creatinine, Ser [id number] 0.44 - 1.00 mg/dL   Calcium 8.7 (L) 8.9 - 10.3 mg/dL   Total Protein 5.0 (L) 6.5 - 8.1 g/dL   Albumin  2.9 (L) 3.5 - 5.0 g/dL   AST 23 15 - 41 U/L   ALT 12 0 - 44 U/L   Alkaline Phosphatase 56 38 - 126 U/L   Total Bilirubin 0.9 0.3 - 1.2 mg/dL   GFR, Estimated 60 (L) >60 mL/min    Comment: (NOTE) Calculated using the CKD-EPI Creatinine Equation (2021)    Anion gap 11 5 - 15    Comment: Performed at [facility], [address], [place], [place]  Lipase, blood     Status: None   Collection Time: August 25, 2020  8:35 PM  Result Value Ref Range   Lipase 28 11 - 51 U/L    Comment: Performed at [facility], [address] [place], [place]  Lactic acid, plasma     Status: Abnormal   Collection Time: August 25, 2020  8:35 PM  Result Value Ref Range   Lactic Acid, Venous 2.1 (HH) 0.5 - 1.9 mmol/L    Comment: CRITICAL RESULT CALLED TO, READ BACK BY AND VERIFIED WITH: L.BULALA,RN 2020/08/25 @1015  BY V.WILKINS Performed at [facility], [address], [place], [place]   Magnesium     Status: None   Collection Time: 08-25-2020  8:35 PM  Result Value Ref Range   Magnesium 2.1 1.7 - 2.4 mg/dL    Comment: Performed at [facility], [address], [place], [place]  Brain natriuretic peptide     Status: Abnormal   Collection Time: 2020-08-25  9:00 PM  Result Value Ref Range   B Natriuretic Peptide 338.8 (H) 0.0 - 100.0 pg/mL    Comment: Performed at [facility], [address], [place], [place]  Respiratory Panel by RT PCR (Flu A&B, Covid) - Nasopharyngeal Swab     Status: None   Collection Time: August 25, 2020  9:12 PM   Specimen: Nasopharyngeal Swab  Result Value Ref Range   SARS Coronavirus 2 by RT PCR NEGATIVE NEGATIVE    Comment: (NOTE) SARS-CoV-2 target nucleic acids are NOT DETECTED.  The SARS-CoV-2 RNA is generally detectable in upper respiratoy specimens during the acute phase of infection. The lowest concentration of SARS-CoV-2 viral copies this assay can  detect is 131 copies/mL. A negative result does not preclude SARS-Cov-2 infection and should not be used as the sole basis for treatment or other patient management decisions. A negative result may occur with  improper specimen collection/handling, submission of specimen other than nasopharyngeal swab, presence  of viral mutation(s) within the areas targeted by this assay, and inadequate number of viral copies (<131 copies/mL). A negative result must be combined with clinical observations, patient history, and epidemiological information. The expected result is Negative.  Fact Sheet for Patients:  https://www.moore.com/  Fact Sheet for Healthcare Providers:  https://www.young.biz/  This test is no t yet approved or cleared by the [place] FDA and  has been authorized for detection and/or diagnosis of SARS-CoV-2 by FDA under an Emergency Use Authorization (EUA). This EUA will remain  in effect (meaning this test can be used) for the duration of the COVID-19 declaration under Section 564(b)(1) of the Act, 21 U.S.C. section 360bbb-3(b)(1), unless the authorization is terminated or revoked sooner.     Influenza A by PCR NEGATIVE NEGATIVE   Influenza B by PCR NEGATIVE NEGATIVE    Comment: (NOTE) The Xpert Xpress SARS-CoV-2/FLU/RSV assay is intended as an aid in  the diagnosis of influenza from Nasopharyngeal swab specimens and  should not be used as a sole basis for treatment. Nasal washings and  aspirates are unacceptable for Xpert Xpress SARS-CoV-2/FLU/RSV  testing.  Fact Sheet for Patients: https://www.moore.com/  Fact Sheet for Healthcare Providers: https://www.young.biz/  This test is not yet approved or cleared by the [place] FDA and  has been authorized for detection and/or diagnosis of SARS-CoV-2 by  FDA under an Emergency Use Authorization (EUA). This EUA will remain  in effect  (meaning this test can be used) for the duration of the  Covid-19 declaration under Section 564(b)(1) of the Act, 21  U.S.C. section 360bbb-3(b)(1), unless the authorization is  terminated or revoked. Performed at [facility], [address], [place], [place] [address]   POC CBG, ED     Status: Abnormal   Collection Time: 29-Aug-2020  9:19 PM  Result Value Ref Range   Glucose-Capillary 474 (H) 70 - 99 mg/dL    Comment: Glucose reference range applies only to samples taken after fasting for at least 8 hours.  I-stat chem 8, ED (not at [facility] or [facility])     Status: Abnormal   Collection Time: 08-29-2020  9:30 PM  Result Value Ref Range   Sodium 144 135 - 145 mmol/L   Potassium 2.4 (LL) 3.5 - 5.1 mmol/L   Chloride 102 98 - 111 mmol/L   BUN 37 (H) 8 - 23 mg/dL   Creatinine, Ser 6.04 0.44 - 1.00 mg/dL   Glucose, Bld 540 (HH) 70 - 99 mg/dL    Comment: Glucose reference range applies only to samples taken after fasting for at least 8 hours.   Calcium, Ion 1.18 1.15 - 1.40 mmol/L   TCO2 31 22 - 32 mmol/L   Hemoglobin 10.2 (L) 12.0 - 15.0 g/dL   HCT 98.1 (L) 36 - 46 %   Comment NOTIFIED PHYSICIAN     Chemistries  Recent Labs  Lab 08-29-20 2035 August 29, 2020 2130  NA 140 144  K 2.4* 2.4*  CL 103 102  CO2 26  --   GLUCOSE 773* 536*  BUN 33* 37*  CREATININE 0.92 0.90  CALCIUM 8.7*  --   MG 2.1  --   AST 23  --   ALT 12  --   ALKPHOS 56  --   BILITOT 0.9  --    ------------------------------------------------------------------------------------------------------------------  ------------------------------------------------------------------------------------------------------------------ GFR: CrCl cannot be calculated (Unknown ideal weight.). Liver Function Tests: Recent Labs  Lab August 29, 2020 2035  AST 23  ALT 12  ALKPHOS 56  BILITOT 0.9  PROT 5.0*  ALBUMIN 2.9*   Recent Labs  Lab Sep 14, 2020 2035  LIPASE 28   No results for input(s): AMMONIA in the last 168  hours. Coagulation Profile: No results for input(s): INR, PROTIME in the last 168 hours. Cardiac Enzymes: No results for input(s): CKTOTAL, CKMB, CKMBINDEX, TROPONINI in the last 168 hours. BNP (last 3 results) No results for input(s): PROBNP in the last 8760 hours. HbA1C: No results for input(s): HGBA1C in the last 72 hours. CBG: Recent Labs  Lab 14-Sep-2020 1924 09/14/20 2006 September 14, 2020 2119  GLUCAP 41* 49* 474*   Lipid Profile: No results for input(s): CHOL, HDL, LDLCALC, TRIG, CHOLHDL, LDLDIRECT in the last 72 hours. Thyroid Function Tests: No results for input(s): TSH, T4TOTAL, FREET4, T3FREE, THYROIDAB in the last 72 hours. Anemia Panel: No results for input(s): VITAMINB12, FOLATE, FERRITIN, TIBC, IRON, RETICCTPCT in the last 72 hours.  --------------------------------------------------------------------------------------------------------------- Urine analysis:    Component Value Date/Time   COLORURINE YELLOW 11/22/2018 2345   APPEARANCEUR CLEAR 11/22/2018 2345   LABSPEC 1.015 11/22/2018 2345   PHURINE 7.0 11/22/2018 2345   GLUCOSEU NEGATIVE 11/22/2018 2345   HGBUR SMALL (A) 11/22/2018 2345   BILIRUBINUR NEGATIVE 11/22/2018 2345   KETONESUR 5 (A) 11/22/2018 2345   PROTEINUR NEGATIVE 11/22/2018 2345   NITRITE NEGATIVE 11/22/2018 2345   LEUKOCYTESUR NEGATIVE 11/22/2018 2345      Imaging Results:    CT Head Wo Contrast  Result Date: Sep 14, 2020 CLINICAL DATA:  Delirium EXAM: CT HEAD WITHOUT CONTRAST TECHNIQUE: Contiguous axial images were obtained from the base of the skull through the vertex without intravenous contrast. COMPARISON:  None. FINDINGS: Brain: Advanced atrophy and chronic small vessel disease throughout the deep white matter. Associated ventriculomegaly. No acute intracranial abnormality. Specifically, no hemorrhage, hydrocephalus, mass lesion, acute infarction, or significant intracranial injury. Vascular: No hyperdense vessel or unexpected calcification.  Skull: No acute calvarial abnormality. Sinuses/Orbits: Visualized paranasal sinuses and mastoids clear. Orbital soft tissues unremarkable. Other: None IMPRESSION: Atrophy, chronic microvascular disease. No acute intracranial abnormality. Electronically Signed   By: Charlett Nose M.D.   On: 2020-09-14 22:40   CT ABDOMEN PELVIS W CONTRAST  Result Date: Sep 14, 2020 CLINICAL DATA:  Hypoglycemia. EXAM: CT ABDOMEN AND PELVIS WITH CONTRAST TECHNIQUE: Multidetector CT imaging of the abdomen and pelvis was performed using the standard protocol following bolus administration of intravenous contrast. CONTRAST:  4mL OMNIPAQUE IOHEXOL 300 MG/ML  SOLN COMPARISON:  None. FINDINGS: Lower chest: Mild atelectasis is seen within the posterior aspect of the left lung base. There is a small right pleural effusion. A 6.1 cm x 4.4 cm the heterogeneous well-defined soft tissue mass is seen within the anterior aspect of the lower chest wall, along the midline. Hepatobiliary: There is diffuse fatty infiltration of the liver no focal liver abnormality is seen. No gallstones, gallbladder wall thickening, or biliary dilatation. Pancreas: Unremarkable. No pancreatic ductal dilatation or surrounding inflammatory changes. Spleen: Normal in size without focal abnormality. Adrenals/Urinary Tract: Adrenal glands are unremarkable. Kidneys are normal in size, without renal calculi or hydronephrosis. A 1.3 cm x 0.9 cm ill-defined mildly hyperdense area is seen within the anterior aspect of the mid left kidney (axial CT image 20, CT series number 3/sagittal reformatted image 82, CT series number 7). The urinary bladder is moderately distended and limited in evaluation secondary to overlying streak artifact. Stomach/Bowel: Stomach is within normal limits. The appendix is not clearly identified. No evidence of bowel dilatation. Numerous decompressed loops of small bowel are suspected within the right abdomen. A very  large amount of stool is seen within  the distal sigmoid colon and rectum. Vascular/Lymphatic: There is marked severity tortuosity of the abdominal aorta, without evidence of aneurysmal dilatation or dissection. No enlarged abdominal or pelvic lymph nodes. Reproductive: Status post hysterectomy. 2.4 cm x 2.0 cm and 2.5 cm x 2.3 cm well-defined areas of low attenuation are seen along the expected region of the left adnexa (axial CT images 48 through 53, CT series number 3/coronal reformatted images 26 through 38, CT series number 6). Other: No abdominal wall hernia or abnormality. There is a mild amount of abdominopelvic ascites. Musculoskeletal: Bilateral total hip replacements are seen with associated streak artifact and subsequently limited evaluation of the adjacent osseous and soft tissue structures. Moderate severity dextroscoliosis of the lumbar spine is seen with multilevel degenerative changes. IMPRESSION: 1. Heterogeneous soft tissue mass within the anterior aspect of the lower chest wall, concerning for the presence of an underlying neoplastic process. 2. Small right pleural effusion. 3. Mild amount of abdominopelvic ascites. 4. Fatty liver. 5. Bilateral total hip replacements. 6. Very large amount of stool within the distal sigmoid colon and rectum. 7. Left adnexal masses which may represent large, complex ovarian cysts. Correlation with pelvic ultrasound is recommended. 8. Small hyperdense area within the left kidney which may represent a small renal mass. MRI correlation is recommended. Electronically Signed   By: Aram Candela M.D.   On: 08/01/2020 23:00   DG Chest Port 1 View  Result Date: 07/31/2020 CLINICAL DATA:  Shortness of breath EXAM: PORTABLE CHEST 1 VIEW COMPARISON:  11/23/2018 FINDINGS: Cardiomegaly. There is hyperinflation of the lungs compatible with COPD. No confluent opacities or effusions. No acute bony abnormality. IMPRESSION: Cardiomegaly, COPD. No active disease. Electronically Signed   By: Charlett Nose M.D.    On: 08/11/2020 21:33    My personal review of EKG: Rhythm NSR, Rate 53 /min, QTc 556,no Acute ST changes   Assessment & Plan:    Active Problems:   Acute metabolic encephalopathy   1. Metabolic encephalopathy 1. Niece who is main caretaker noticed the patient stopped eating today, this was different from her baseline so she called EMS 2. Reports no infectious symptoms 3. Reports no access to medication that she could potentially overdose on, no alcohol, no illicit drugs 4. When EMS arrived glucose was in the 40s, D10 given 5. In the hospital glucose was in the 40s again, amp of D50 given 6. Glucose then improved to 700, 500, 400 7. Mental status did not improve with the improvement of glucose 8. TSH, ammonia pending 9. Urine pending to assess for infectious etiology 10. CT head showed no acute abnormality 11. Continue to monitor 2. Hypoglycemia 1. Glucose 40s in the field, 500 of D10 given, 40s in the hospital, amp of D50 given glucose improved to 700s 2. Continue to check CBG every hour as glucose is trending down and we do not want her to dip too low again 3. Hypokalemia 1. Replace and recheck 4. Leukopenia 1. Chronically low white blood cell count today is 3.4, was 3.5 in February 2020 on last check 5. Prolonged QTC 1. EKG shows a prolonged QTC at 556 2. Correct electrolyte abnormalities 3. Monitor on telemetry 6. Bradycardia 1. Heart rate in the 40s and 50s 2. Replacing electrolytes 3. Patient is not on beta-blocker 4. Blood pressure stable 5. monitor on telemetry 7. Dysphagia reported by niece 1. CT ruled out acute bleed 2. MRI to assess for stroke 8. Protein calorie malnutrition 1.  Albumin 2.9 2. Encourage patient to eat 3. Attempt Ensure Enlive between meals 9. Dementia 1. Baseline patient talks, incoherently 2. Family reports that patient was walking up till 3 weeks ago, physical exam is not consistent 3. Patient requires help with ADLs but does normally eat,  the change to date was that she was not eating   DVT Prophylaxis-   heparin - SCDs   AM Labs Ordered, also please review Full Orders  Family Communication: Admission, patients condition and plan of care including tests being ordered have been discussed with the patient and niece, with son on the phone who indicate understanding and agree with the plan and Code Status.  Code Status:  FULL  Admission status: Observation Time spent in minutes : 64   Kashara Blocher B Zierle-Ghosh DO

## 2020-08-22 NOTE — Progress Notes (Signed)
Family has left for the evening.

## 2020-08-22 NOTE — Progress Notes (Signed)
  Echocardiogram 2D Echocardiogram has been performed.  Burnard Hawthorne 08/22/2020, 2:39 PM

## 2020-08-22 NOTE — Progress Notes (Signed)
Pts son, and several other family members at the bedside. Discussed pts current status and sons concerns about his mothers decline. Emotional support offered.

## 2020-08-22 NOTE — Progress Notes (Addendum)
Pharmacy Antibiotic Note  Erica Vasquez is a 86 y.o. female admitted on 08/09/2020 with sepsis.  Pharmacy has been consulted for vancomycin + cefepime dosing.  Pt has PMH significant for advanced dementia, HTN. Broad spectrum antibiotics being initiated.  Today, 08/22/20  WBC 3.8  SCr 0.64, CrCl ~40mls/min  Lactate 4.7  Plan:  Vancomycin 750mg  IV  x1 then 500mg  q24h  Cefepime 2gm IV q24h  Follow renal function, cultures and clinical course  Height: 5' (152.4 cm) IBW/kg (Calculated) : 45.5  Temp (24hrs), Avg:96.1 F (35.6 C), Min:92.9 F (33.8 C), Max:98 F (36.7 C)  Recent Labs  Lab 08/12/2020 2035 08/14/2020 2130 07/29/2020 2351 08/22/20 0210 08/22/20 1158 08/22/20 1849  WBC 3.4*  --   --  3.8*  --   --   CREATININE 0.92 0.90  --  0.82  --  0.64  LATICACIDVEN 2.1*  --  4.7*  --  4.7*  --     CrCl cannot be calculated (Unknown ideal weight.).    Allergies  Allergen Reactions  . Bimatoprost Itching  . Brimonidine Itching  . Timolol Itching    Antimicrobials this admission: Cefepime 11/1 >>  Vancomycin 11/1 >>   Dose adjustments this admission:  Microbiology results: 10/31 BCx: ngtd 10/31 UCx:    Thank you for allowing pharmacy to be a part of this patient's care.  11/31 RPh 08/22/2020, 10:48 PM

## 2020-08-22 NOTE — Progress Notes (Signed)
Dr Otelia Limes at the bedside he evaluated pt and spoke with family who are at the [facility]. This Dr also called & spoke with [facility]  M Denny,NP.

## 2020-08-22 NOTE — Consult Note (Signed)
Consultation Note Date: 08/22/2020   Patient Name: Erica Vasquez  DOB: 07/31/1930  MRN: [id number]  Age / Sex: 86 y.o., female  PCP: Iona Beard, MD Referring Physician: Caren Griffins, MD  Reason for Consultation: Establishing goals of care  HPI/Patient Profile: 86 y.o. female  with past medical history of blindness, advanced dementia, chest mass that has never been diagnosed, admitted on 08/10/2020 with altered mental status, hypoglycemia, pancytopenia, hypokalemia, bradycardia.  Work-up revealed acute CVA, and pulmonary embolism.  She is mostly nonresponsive. Palliative consulted for goals of care.  Clinical Assessment and Goals of Care:  I have reviewed medical records including EPIC notes, labs and imaging, examined the patient and met at bedside with patient's son to discuss diagnosis prognosis, GOC, EOL wishes, disposition and options.  We discussed a brief life review of the patient.  She is retired from working as a Licensed conveyancer.  She has 2 masters degrees.  Charlotte Crumb is her only son.  She does have a niece who has been involved in her care taking however Charlotte Crumb is a Tax adviser.  Patient and son are of the [employer].  As far as functional and nutritional status-Per [name] patient is able to stand for completion of ADLs.  She was previously eating minimally and would frequently hold food in her mouth.  He has noticed a significant amount of weight loss in the past several months.  He has also noticed that her ability to ambulate has significantly declined.  She is blind and keeps her eyes closed all of the time.  Prior to admission she would not in communication but talked minimally.  We discussed her current illness and what it means in the larger context of her on-going co-morbidities.  Natural disease trajectory and expectations at EOL were discussed.  I reviewed with Charlotte Crumb what  advanced dementia looks like in the trajectory of dementia and its natural course and the fact that it is a chronic and progressive disease that people eventually do die from.  Additionally, she has a chest mass that is very suspicious for cancer that has not been treated and there are no plans to treat it.  We discussed that her new onset of strokes will likely significantly affect her state of dementia.  When discussing wishes regarding end-of-life Charlotte Crumb is not able to elicit any hopes or goals.  Dr. Cruzita Lederer was present during some of our discussion and reviewed with [name] patient's difficult situation having both CVA and pulmonary embolism.  There is concern that anticoagulation for pulmonary embolism would lead to bleeding in the brain from CVA, and concerned that no treatment for pulmonary embolism or delaying treatment for pulmonary embolism would put patient at high risk for pulmonary embolism worsening in cardiac function worsening leading to patient's death.  Charlotte Crumb has taking care of the patient for many years and this is his purpose and role in life.  He shares that his goals for her would be able to waken some and show some signs of life.  However,  he would not wish for her to persist in a vegetative state.  The difference between aggressive medical intervention and comfort care was discussed.     Advanced directives, concepts specific to code status, artifical feeding and hydration, and rehospitalization were considered and discussed.  Bethann Berkshire was not able to make any decisions at this point in time, he would like to see how the next 24 hours go for the patient and resume discussion.  DNR was strongly encouraged as patient is very frail and CPR would be traumatic if she were to experience cardiac or respiratory arrest doing CPR and placing patient on a ventilator would not reverse any of the dying processes that she is currently undergoing and would not improve her quality of life, and would be  more traumatic on her than it would be helpful.  Daughter inquired about feeding tube, we discussed what the goal of a feeding tube would be in a patient with advanced dementia with multiple processes that she could possibly be dying from.  I discussed with Bethann Berkshire that feeding tubes are not recommended in patients with end-stage dementia.  Hospice and Palliative Care services outpatient were explained and offered.  Questions and concerns were addressed.  The family was encouraged to call with questions or concerns.   Primary Decision Maker NEXT OF Carol Ada    SUMMARY OF RECOMMENDATIONS -Continue current care for now -Hard choices book was given to [facility] for review -Bethann Berkshire finds himself in a difficult position feeling he has little support for assistance with decision making, he also finds that his role as caring for his mom is his primary purpose in life-this makes it very difficult for him to imagine a world in which his mom is no longer present and to even discuss possible decisions to make about her end-of-life -PMT will continue to follow and discuss goals of care with the patient's son -Chaplain support requested for patient's son  Code Status/Advance Care Planning:  Full code  Palliative Prophylaxis:   Eye Care and Oral Care  Additional Recommendations (Limitations, Scope, Preferences):  Full Scope Treatment  Psycho-social/Spiritual:   Desire for further Chaplaincy support:yes  Prognosis:    Unable to determine -I do worry that despite all current interventions patient is very close to end-of-life  Discharge Planning: To Be Determined  Primary Diagnoses: Present on Admission: . Acute metabolic encephalopathy . CVA (cerebral vascular accident) (HCC)   I have reviewed the medical record, interviewed the patient and family, and examined the patient. The following aspects are pertinent.  Past Medical History:  Diagnosis Date  . Alzheimer disease  (HCC)    mild  . Arthritis   . Blind   . Cataract   . Cataract cortical, senile   . Dementia (HCC)   . Essential hypertension   . Glaucoma   . Hypertension    Social History   Socioeconomic History  . Marital status: Single    Spouse name: Not on file  . Number of children: Not on file  . Years of education: Not on file  . Highest education level: Not on file  Occupational History  . Not on file  Tobacco Use  . Smoking status: Never Smoker  . Smokeless tobacco: Never Used  Vaping Use  . Vaping Use: Never used  Substance and Sexual Activity  . Alcohol use: Not Currently  . Drug use: Not Currently  . Sexual activity: Not on file  Other Topics Concern  . Not on file  Social  History Narrative  . Not on file   Social Determinants of Health   Financial Resource Strain:   . Difficulty of Paying Living Expenses: Not on file  Food Insecurity:   . Worried About Charity fundraiser in the Last Year: Not on file  . Ran Out of Food in the Last Year: Not on file  Transportation Needs:   . Lack of Transportation (Medical): Not on file  . Lack of Transportation (Non-Medical): Not on file  Physical Activity:   . Days of Exercise per Week: Not on file  . Minutes of Exercise per Session: Not on file  Stress:   . Feeling of Stress : Not on file  Social Connections:   . Frequency of Communication with Friends and Family: Not on file  . Frequency of Social Gatherings with Friends and Family: Not on file  . Attends Religious Services: Not on file  . Active Member of Clubs or Organizations: Not on file  . Attends Archivist Meetings: Not on file  . Marital Status: Not on file   Scheduled Meds: . aspirin  300 mg Rectal Daily  . feeding supplement  237 mL Oral BID BM  . heparin  5,000 Units Subcutaneous Q8H  . insulin aspart  0-9 Units Subcutaneous TID WC  . lactulose  20 g Oral BID  . multivitamin with minerals  1 tablet Oral Daily  . pantoprazole  40 mg Oral Daily   . senna-docusate  2 tablet Oral BID   Continuous Infusions: . dextrose 5 % and 0.45% NaCl 75 mL/hr at 08/22/20 0912  . potassium chloride 10 mEq (08/22/20 1214)   PRN Meds:.acetaminophen **OR** acetaminophen, HYDROcodone-acetaminophen, methocarbamol, ondansetron **OR** ondansetron (ZOFRAN) IV, polyethylene glycol Medications Prior to Admission:  Prior to Admission medications   Medication Sig Start Date End Date Taking? Authorizing Provider  Acetamin Chew Tab & Susp (TYLENOL CHILDRENS) 160 & 160 MG &MG/5ML THPK Take 30 mLs by mouth 2 (two) times daily as needed (pain).   Yes [provider]  amLODipine (NORVASC) 2.5 MG tablet Take 1 tablet (2.5 mg total) by mouth daily. 01/30/18 08/05/2020 Yes Arrien, Jimmy Picket, MD  lactulose [facility]) 10 GM/15ML solution Take 20 g by mouth 2 (two) times daily. 08/09/20  Yes [provider]  aspirin EC 325 MG EC tablet Take 1 tablet (325 mg total) by mouth daily with breakfast. Patient not taking: Reported on 07/27/2020 11/25/18   Lanae Crumbly, PA-C  celecoxib (CELEBREX) 200 MG capsule Take 1 capsule (200 mg total) by mouth 2 (two) times daily. Patient not taking: Reported on 07/28/2020 08/21/19   Aundra Dubin, PA-C  docusate sodium (COLACE) 100 MG capsule Take 1 capsule (100 mg total) by mouth 2 (two) times daily. Patient not taking: Reported on 08/04/2020 11/28/18   Oretha Milch D, MD  HYDROcodone-acetaminophen (NORCO/VICODIN) 5-325 MG tablet Take 1 tablet by mouth every 6 (six) hours as needed for moderate pain (pain score 4-6). Patient not taking: Reported on 08/03/2020 11/25/18   Lanae Crumbly, PA-C  methocarbamol (ROBAXIN) 500 MG tablet Take 1 tablet (500 mg total) by mouth every 6 (six) hours as needed for muscle spasms. Patient not taking: Reported on 07/22/2020 11/28/18   Oretha Milch D, MD  pantoprazole (PROTONIX) 40 MG tablet Take 1 tablet (40 mg total) by mouth daily. Patient not taking: Reported on 08/01/2020 11/29/18    Desiree Hane, MD  traMADol (ULTRAM) 50 MG tablet Take 1/2 to 1 tab po bid  prn pain Patient not taking: Reported on 07/27/2020 08/20/19   Aundra Dubin, PA-C   Allergies  Allergen Reactions  . Bimatoprost Itching  . Brimonidine Itching  . Timolol Itching   Review of Systems  Unable to perform ROS: Dementia    Physical Exam Vitals and nursing note reviewed.  Constitutional:      Comments: Extremely cachetic, frail  Eyes:     Comments: blind  Neurological:     Comments: Responds to noxious stimuli, does not follow commands, nonverbal     Vital Signs: BP (!) 161/84 (BP Location: Left Arm)   Pulse 71   Temp 97.8 F (36.6 C)   Resp 16   SpO2 97%  Pain Scale: Faces   Pain Score: 0-No pain   SpO2: SpO2: 97 % O2 Device:SpO2: 97 % O2 Flow Rate: .O2 Flow Rate (L/min): 3 L/min  IO: Intake/output summary:   Intake/Output Summary (Last 24 hours) at 08/22/2020 1335 Last data filed at 08/22/2020 8563 Gross per 24 hour  Intake 1787.27 ml  Output 600 ml  Net 1187.27 ml    LBM:   Baseline Weight:   Most recent weight:       Palliative Assessment/Data: PPS: 10%     Thank you for this consult. Palliative medicine will continue to follow and assist as needed.   Time In: 1333 Time Out: 1523 Time Total: 120 mins Prolonged services billed: yes Greater than 50%  of this time was spent counseling and coordinating care related to the above assessment and plan.  Signed by: Mariana Kaufman, AGNP-C Palliative Medicine    Please contact Palliative Medicine Team phone at [phone] for questions and concerns.  For individual provider: See Shea Evans

## 2020-08-22 NOTE — Progress Notes (Addendum)
No charge note  86 year old female with history of HTN, glaucoma, advanced dementia, presents to the hospital brought by her niece with mental status changes.  Niece reports that patient was not eating and was not talking, she does talk sometimes but is incoherent.  She was just holding her food in her mouth.  She was also found to be hypoglycemic.  Acute CVA, acute metabolic encephalopathy -MRI done this morning shows acute CVA.  Neurology has been consulted, transfer patient to [facility] for stroke team evaluation -Finish stroke work-up, obtain 2D echo, CT angiogram head and neck, PT/OT, lipid panel, A1c.  Put aspirin suppository as she is not taking any p.o. right now  Advanced dementia -Son tells me that there is fairly advanced, and they are concerned about her p.o. intake as she is sometimes pockets food and does not eat.  He is inquiring about a feeding tube.  I think this is relatively contraindicated in her given her advanced dementia.  Palliative care has been consulted for further discussion with goals of care.  She remains full code. -Per family she was walking up until 3 weeks ago  Hypoglycemia -No evidence of an infectious process going on, check cortisol, TSH is normal at 3.5.  Dextrose infusion.  Addendum - called by cardiology that EF is 20-30%. She is persistently hypoglycemic and towards the evening becoming hypothermic. TSH is normal. Cortisol is normal as well. Due to concern for sepsis will start broad spectrum antibiotics.   Bradycardia -Monitor, blood pressure is stable  Hypokalemia -Due to poor p.o. intake, continue to replete.  Check magnesium level  Pancytopenia -Likely multifactorial, chronic illness, poor nutrition.  Monitor.  Possible malignancy -CT scan shows soft tissue mass in the lower chest wall, there is also concern about hyperdense area in the mid left kidney, also left adnexal masses which may represent large, complex ovarian cyst.  This was  known to the family and son tells me that she is known about the breast mass for a while now, did not want further work-up in the past.  Acute PE -Patient by radiology that CT angiogram of the head as well as neck showed a partially visualized PE.  In the setting of an acute stroke, anticoagulation poses a risk of having hemorrhagic transformation in the first 24-48 hours.  Not treating the PE has its own implications as wel.  This was discussed with the patient's son, and per his wishes hold anticoagulation right now and start if her breathing is to become worse or she is to have hypotension.  Appreciate neurology input into timing about starting anticoagulation  Scheduled Meds: . aspirin  300 mg Rectal Daily  . feeding supplement  237 mL Oral BID BM  . heparin  5,000 Units Subcutaneous Q8H  . insulin aspart  0-9 Units Subcutaneous TID WC  . lactulose  20 g Oral BID  . multivitamin with minerals  1 tablet Oral Daily  . pantoprazole  40 mg Oral Daily  . senna-docusate  2 tablet Oral BID   Continuous Infusions: . dextrose 5 % and 0.45% NaCl 75 mL/hr at 08/22/20 0912  . potassium chloride     PRN Meds:.acetaminophen **OR** acetaminophen, HYDROcodone-acetaminophen, methocarbamol, ondansetron **OR** ondansetron (ZOFRAN) IV, polyethylene glycol  Bellamarie Pflug M. Elvera Lennox, MD, PhD [facility]  Between 7 am - 7 pm you can contact me via Amion or Securechat.  I am not available 7 pm - 7 am, please contact night coverage MD/APP via Amion

## 2020-08-22 NOTE — Consult Note (Signed)
NEURO HOSPITALIST CONSULT NOTE   Requestig physician: Dr. Elvera LennoxGherghe  Reason for Consult: AMS  History obtained from:  Family and Chart     HPI:                                                                                                                                          Erica Vasquez is an 86 y.o. female with a PMHx of Alzheimer dementia requiring full-time care by family at home, pressure ulcers, blindness secondary to glaucoma and cataracts, right sided breast mass deemed inoperable due to advanced age and HTN, who presented to the ED today with AMS. Family states that at her baseline she has significant cognitive impairment due to her dementia but does vocalize incoherently when moved from her bed to a chair. On Saturday, she became less interactive/vocal. On Sunday she became completely nonverbal and did not exclaim as she usually does when moved by family from her bed to her chair. Additionally, she would not swallow her food, instead pocketing it when she was fed her applesauce and another soft item (she is on a soft mechanical diet). EMS was called and on arrival, the patients CBG was noted to be in the 40s. She was administered D10 with improvement of her CBG to the 200s. On arrival to the ED her CBG was back in the 40s; an amp of D50 was then given with resulting severe elevation in her CBG that then trended downward. Of note, her mental status did not improve with glucose administration. She did not have any symptoms of infection per family.   She was afebrile with aHR of 50 and BP of 177/64 in the ED. No white count. Na was 144. K was low at 2.4. BUN/Cr ratio was significantly elevated. Lactate was 2.1. Body temperature then dropped significantly and a warming blanket was applied.   MRI brain revealed two subcentimeter acute to subacute ischemic infarctions. Neurology was called to further evaluate.   Past Medical History:  Diagnosis Date  . Alzheimer  disease (HCC)    mild  . Arthritis   . Blind   . Cataract   . Cataract cortical, senile   . Dementia (HCC)   . Essential hypertension   . Glaucoma   . Hypertension     Past Surgical History:  Procedure Laterality Date  . ABDOMINAL HYSTERECTOMY    . CATARACT EXTRACTION    . GLAUCOMA SURGERY Bilateral 2010   SLT  . HIP ARTHROPLASTY Left 11/23/2018   Procedure: ARTHROPLASTY BIPOLAR HIP (HEMIARTHROPLASTY);  Surgeon: Eldred MangesYates, Mark C, MD;  Location[facility] OR;  Service: Orthopedics;  Laterality: Left;  . TOTAL HIP ARTHROPLASTY Right 01/26/2018   Procedure: TOTAL HIP ARTHROPLASTY ANTERIOR APPROACH;  Surgeon: Tarry KosXu, Naiping M, MD;  Location: [facility] OR;  Service: Orthopedics;  Laterality: Right;    Family History  Problem Relation Age of Onset  . Dementia Mother   . Stroke Mother   . Dementia Father   . Glaucoma Father   . Cataracts Father   . Hypertension Father   . Cataracts Sister   . Glaucoma Sister   . Hypertension Sister   . Retinal detachment Sister               Social History:  reports that she has never smoked. She has never used smokeless tobacco. She reports previous alcohol use. She reports previous drug use.  Allergies  Allergen Reactions  . Bimatoprost Itching  . Brimonidine Itching  . Timolol Itching    MEDICATIONS:                                                                                                                     Prior to Admission:  Medications Prior to Admission  Medication Sig Dispense Refill Last Dose  . Acetamin Chew Tab & Susp (TYLENOL CHILDRENS) 160 & 160 MG &MG/5ML THPK Take 30 mLs by mouth 2 (two) times daily as needed (pain).   Past Week at Unknown time  . amLODipine (NORVASC) 2.5 MG tablet Take 1 tablet (2.5 mg total) by mouth daily. 30 tablet 0 08/13/2020 at Unknown time  . lactulose (CHRONULAC) 10 GM/15ML solution Take 20 g by mouth 2 (two) times daily.   08/12/2020  . aspirin EC 325 MG EC tablet Take 1 tablet (325 mg total) by mouth daily  with breakfast. (Patient not taking: Reported on 08/13/2020) 30 tablet 0 Not Taking at Unknown time  . celecoxib (CELEBREX) 200 MG capsule Take 1 capsule (200 mg total) by mouth 2 (two) times daily. (Patient not taking: Reported on 08/05/2020) 60 capsule 0 Not Taking at Unknown time  . docusate sodium (COLACE) 100 MG capsule Take 1 capsule (100 mg total) by mouth 2 (two) times daily. (Patient not taking: Reported on 08/13/2020) 10 capsule 0 Not Taking at Unknown time  . HYDROcodone-acetaminophen (NORCO/VICODIN) 5-325 MG tablet Take 1 tablet by mouth every 6 (six) hours as needed for moderate pain (pain score 4-6). (Patient not taking: Reported on 08/11/2020) 30 tablet 0 Not Taking at Unknown time  . methocarbamol (ROBAXIN) 500 MG tablet Take 1 tablet (500 mg total) by mouth every 6 (six) hours as needed for muscle spasms. (Patient not taking: Reported on 08/07/2020) 30 tablet 0 Not Taking at Unknown time  . pantoprazole (PROTONIX) 40 MG tablet Take 1 tablet (40 mg total) by mouth daily. (Patient not taking: Reported on 07/30/2020) 60 tablet 0 Not Taking at Unknown time  . traMADol (ULTRAM) 50 MG tablet Take 1/2 to 1 tab po bid prn pain (Patient not taking: Reported on 08/13/2020) 30 tablet 0 Not Taking at Unknown time   Scheduled: . aspirin  300 mg Rectal Daily  . dextrose  25 mL Intravenous Q2H  . enoxaparin (LOVENOX) injection  40 mg Subcutaneous Q12H  .  feeding supplement  237 mL Oral BID BM  . hydrocortisone sod succinate (SOLU-CORTEF) inj  50 mg Intravenous Q8H  . insulin aspart  0-9 Units Subcutaneous TID WC  . lactulose  20 g Oral BID  . multivitamin with minerals  1 tablet Oral Daily  . pantoprazole  40 mg Oral Daily  . senna-docusate  2 tablet Oral BID   Continuous: . vancomycin       ROS:                                                                                                                                       Unable to obtain due to encephalopathy.    Blood pressure  117/60, pulse 72, temperature (!) 92.9 F (33.8 C), temperature source Axillary, resp. rate (!) 8, height 5' (1.524 m), SpO2 100 %.   General Examination:                                                                                                       Physical Exam  General: Frail appearing, cachectic elderly female in NAD.  HEENT-  [place]/AT. Temporalis muscle wasting noted. Corneal opacities noted.    Lungs- Respirations unlabored.  Extremities- No cyanosis or pallor  Neurological Examination Mental Status: Obtunded. Will not open eyes to her name. Slight eye opening with repeated loud clapping. Fully opens eyes with noxious plantar stimulation and did exclaim incoherently. Otherwise nonverbal. Limited semipurposeful movement of LUE. Did grip with LUE on command, but did not follow any other commands.  Cranial Nerves: II: Closes eyes tightly when attempting to test pupillary light reflexes; pupils appear grossly symmetric in the context of corneal opacifications. Per family she is blind in both eyes at baseline.  III,IV, VI: Did not gaze to left or right on command but spontaneously is gazing towards the left without nystagmus. Unable to test oculocephalic reflex due to patient resistance.  VII: Unable to test for symmetry as patient is lying on her side with sagging of face due to gravity and resists movement to supine position.  VIII: Does not respond to voice IX,X: Unable to assess XI: Unable to assess XII: Protrudes tongue intermittently Motor/Sensory: RUE flaccid tone with no movement to repeated light pinch. Does move LUE after repeated light pinch to RUE.  LUE grip 4-5 and spontaneously flexes at elbow, but otherwise does not move. Tone is mildly increased. Reacts to pinch.  RLE: Decreased tone. Weakly dorsiflexes foot to plantar stimulation but otherwise does  not respond.  LLE: Decreased tone. Weakly dorsiflexes foot to plantar stimulation and exclaims while opening her eyes.   Deep Tendon Reflexes: Hypoactive x 4.  Plantars: Upgoing bilaterally Cerebellar/Gait: Unable to assess   Lab Results: Basic Metabolic Panel: Recent Labs  Lab 08/14/2020 2035 08/03/2020 2130 08/22/20 0210 08/22/20 1039 08/22/20 1849  NA 140 144 145  --  140  K 2.4* 2.4* 2.8* 3.3* 3.7  CL 103 102 106  --  102  CO2 26  --  29  --  28  GLUCOSE 773* 536* 220*  --  285*  BUN 33* 37* 31*  --  21  CREATININE 0.92 0.90 0.82  --  0.64  CALCIUM 8.7*  --  8.7*  --  8.1*  MG 2.1  --  2.2  --   --     CBC: Recent Labs  Lab 08/19/2020 2035 08/06/2020 2130 08/22/20 0210  WBC 3.4*  --  3.8*  NEUTROABS 2.9  --  3.2  HGB 10.1* 10.2* 11.2*  HCT 32.8* 30.0* 35.5*  MCV 91.9  --  90.6  PLT 101*  --  122*    Cardiac Enzymes: No results for input(s): CKTOTAL, CKMB, CKMBINDEX, TROPONINI in the last 168 hours.  Lipid Panel: No results for input(s): CHOL, TRIG, HDL, CHOLHDL, VLDL, LDLCALC in the last 168 hours.  Imaging: CT ANGIO HEAD W OR WO CONTRAST  Result Date: 08/22/2020 CLINICAL DATA:  Stroke/TIA, assess intracranial artery. Mental status change with advanced dementia. EXAM: CT HEAD WITHOUT CONTRAST CT ANGIOGRAPHY OF THE HEAD AND NECK TECHNIQUE: Contiguous axial images were obtained from the base of the skull through the vertex without intravenous contrast. Multidetector CT imaging of the head and neck was performed using the standard protocol during bolus administration of intravenous contrast. Multiplanar CT image reconstructions and MIPs were obtained to evaluate the vascular anatomy. Carotid stenosis measurements (when applicable) are obtained utilizing NASCET criteria, using the distal internal carotid diameter as the denominator. CONTRAST:  OMNIPAQUE IOHEXOL 350 MG/ML SOLN COMPARISON:  Same day MRI.  MRA 10/10/2009 FINDINGS: CT HEAD Brain: Small infarcts are better characterized on same day MRI. No evidence of acute hemorrhage, hydrocephalus, extra-axial collection, mass lesion or  abnormal mass effect. Chronic microvascular ischemic change and diffuse cerebral atrophy with ex vacuo ventricular dilation Vascular: Calcific atherosclerosis. Skull: No acute fracture. Sinuses/Orbits: No acute findings. CTA NECK Aortic arch: Great vessel origins are patent. Right carotid system: Tortuous, retropharyngeal course. No evidence of significant stenosis. Left carotid system: Tortuous, retropharyngeal course. There is moderate short segment narrowing of the internal carotid artery just proximal to the skull base. Vertebral arteries:Left dominant. No evidence of significant stenosis or occlusion. Tortuous. Skeleton: Multilevel severe degenerative disc disease of the cervical spine. Other neck: Cachectic with very little fat within the neck. This limits evaluation without obvious mass or adenopathy. Upper Chest: Filling defect within the left pulmonary artery extending into the partially visualized left lobar pulmonary arteries, compatible with pulmonary embolism. CTA HEAD Anterior circulation: Predominately calcific atherosclerosis of the left cavernous carotid artery without evidence of greater than 50% stenosis. Severe narrowing of a proximal right M2 MCA branch (see series 10, image 124) with multifocal moderate narrowing of the more distal M2 MCA branches. Moderate narrowing of a proximal left M2 MCA branch (see series 10, image 115). 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 Posterior circulation: Dysplastic appearance of the proximal basilar artery, which is widely patent. Multifocal severe  stenosis of bilateral P1 and P2 PCAs. Venous sinuses: Poorly visualized given arterial timing. Within this limitation, no at evidence of dural sinus thrombosis. IMPRESSION: 1. Filling defect within the left pulmonary artery extending into the partially visualized left lobar pulmonary arteries, compatible with pulmonary embolism. Recommend CTA of the  chest when renal function allows to further evaluate and to also exclude hilar malignancy. 2. Multifocal severe stenosis of bilateral P1 and P2 PCAs. 3. Severe stenosis of a proximal right M2 MCA branch with multifocal moderate more distal M2 MCA narrowing. 4. Moderate narrowing of a proximal left M2 MCA branch. 5. Moderate short-segment narrowing of the left internal carotid artery at the skull base. 6. 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 Recommend follow-up CTA in 1 year to ensure stability. 7. Known small infarcts better characterized on same day MRI. Critical findings were called by telephone at the time of interpretation on 08/22/2020 at 1:56 pm to provider [facility] , who verbally acknowledged these results. Additional findings were discussed with COSTIN GHERGHE at 2:08 p.m. via telephone. Electronically Signed   By: Feliberto Harts MD   On: 08/22/2020 14:15   CT Head Wo Contrast  Result Date: 07/25/2020 CLINICAL DATA:  Delirium EXAM: CT HEAD WITHOUT CONTRAST TECHNIQUE: Contiguous axial images were obtained from the base of the skull through the vertex without intravenous contrast. COMPARISON:  None. FINDINGS: Brain: Advanced atrophy and chronic small vessel disease throughout the deep white matter. Associated ventriculomegaly. No acute intracranial abnormality. Specifically, no hemorrhage, hydrocephalus, mass lesion, acute infarction, or significant intracranial injury. Vascular: No hyperdense vessel or unexpected calcification. Skull: No acute calvarial abnormality. Sinuses/Orbits: Visualized paranasal sinuses and mastoids clear. Orbital soft tissues unremarkable. Other: None IMPRESSION: Atrophy, chronic microvascular disease. No acute intracranial abnormality. Electronically Signed   By: Charlett Nose M.D.   On: 08/12/2020 22:40   CT ANGIO NECK W OR WO CONTRAST  Result Date: 08/22/2020 CLINICAL DATA:  Stroke/TIA, assess intracranial artery.  Mental status change with advanced dementia. EXAM: CT HEAD WITHOUT CONTRAST CT ANGIOGRAPHY OF THE HEAD AND NECK TECHNIQUE: Contiguous axial images were obtained from the base of the skull through the vertex without intravenous contrast. Multidetector CT imaging of the head and neck was performed using the standard protocol during bolus administration of intravenous contrast. Multiplanar CT image reconstructions and MIPs were obtained to evaluate the vascular anatomy. Carotid stenosis measurements (when applicable) are obtained utilizing NASCET criteria, using the distal internal carotid diameter as the denominator. CONTRAST:  OMNIPAQUE IOHEXOL 350 MG/ML SOLN COMPARISON:  Same day MRI.  MRA 10/10/2009 FINDINGS: CT HEAD Brain: Small infarcts are better characterized on same day MRI. No evidence of acute hemorrhage, hydrocephalus, extra-axial collection, mass lesion or abnormal mass effect. Chronic microvascular ischemic change and diffuse cerebral atrophy with ex vacuo ventricular dilation Vascular: Calcific atherosclerosis. Skull: No acute fracture. Sinuses/Orbits: No acute findings. CTA NECK Aortic arch: Great vessel origins are patent. Right carotid system: Tortuous, retropharyngeal course. No evidence of significant stenosis. Left carotid system: Tortuous, retropharyngeal course. There is moderate short segment narrowing of the internal carotid artery just proximal to the skull base. Vertebral arteries:Left dominant. No evidence of significant stenosis or occlusion. Tortuous. Skeleton: Multilevel severe degenerative disc disease of the cervical spine. Other neck: Cachectic with very little fat within the neck. This limits evaluation without obvious mass or adenopathy. Upper Chest: Filling defect within the left pulmonary artery extending into the partially visualized left lobar pulmonary arteries, compatible with pulmonary  embolism. CTA HEAD Anterior circulation: Predominately calcific atherosclerosis of the  left cavernous carotid artery without evidence of greater than 50% stenosis. Severe narrowing of a proximal right M2 MCA branch (see series 10, image 124) with multifocal moderate narrowing of the more distal M2 MCA branches. Moderate narrowing of a proximal left M2 MCA branch (see series 10, image 115). 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 Posterior circulation: Dysplastic appearance of the proximal basilar artery, which is widely patent. Multifocal severe stenosis of bilateral P1 and P2 PCAs. Venous sinuses: Poorly visualized given arterial timing. Within this limitation, no at evidence of dural sinus thrombosis. IMPRESSION: 1. Filling defect within the left pulmonary artery extending into the partially visualized left lobar pulmonary arteries, compatible with pulmonary embolism. Recommend CTA of the chest when renal function allows to further evaluate and to also exclude hilar malignancy. 2. Multifocal severe stenosis of bilateral P1 and P2 PCAs. 3. Severe stenosis of a proximal right M2 MCA branch with multifocal moderate more distal M2 MCA narrowing. 4. Moderate narrowing of a proximal left M2 MCA branch. 5. Moderate short-segment narrowing of the left internal carotid artery at the skull base. 6. 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 Recommend follow-up CTA in 1 year to ensure stability. 7. Known small infarcts better characterized on same day MRI. Critical findings were called by telephone at the time of interpretation on 08/22/2020 at 1:56 pm to provider [facility] , who verbally acknowledged these results. Additional findings were discussed with COSTIN GHERGHE at 2:08 p.m. via telephone. Electronically Signed   By: Feliberto Harts MD   On: 08/22/2020 14:15   MR BRAIN W WO CONTRAST  Result Date: 08/22/2020 CLINICAL DATA:  Neuro deficit, acute stroke suspected. Severe Alzheimer's.  Altered mental status. EXAM: MRI HEAD WITHOUT AND WITH CONTRAST TECHNIQUE: Multiplanar, multiecho pulse sequences of the brain and surrounding structures were obtained without and with intravenous contrast. CONTRAST:  4.76mL GADAVIST GADOBUTROL 1 MMOL/ML IV SOLN COMPARISON:  CT head 08/13/2020. FINDINGS: Brain: Restricted diffusion involving the right putamen and the left corona radiata. Right putamen restricted diffusion is faint. Mild associated edema without significant mass effect. Mild enhancement involving the right putamen no midline shift. Marked mesial temporal lobe predominant atrophy with associated ex vacuo ventricular dilation. No mass lesion. Vascular: Major arterial flow voids are maintained at the skull base. Skull and upper cervical spine: Normal marrow signal. Sinuses/Orbits: No acute findings. Other: No mastoid effusions. IMPRESSION: 1. Small acute infarct involving the left corona radiata. Additional small infarct in the right putamen, possibly subacute given associated faint enhancement. Mild associated edema without significant mass effect. 2. Marked mesial temporal lobe predominant atrophy, as can be seen with Alzheimer's. Electronically Signed   By: Feliberto Harts MD   On: 08/22/2020 10:11   CT ABDOMEN PELVIS W CONTRAST  Result Date: 08/05/2020 CLINICAL DATA:  Hypoglycemia. EXAM: CT ABDOMEN AND PELVIS WITH CONTRAST TECHNIQUE: Multidetector CT imaging of the abdomen and pelvis was performed using the standard protocol following bolus administration of intravenous contrast. CONTRAST:  80mL OMNIPAQUE IOHEXOL 300 MG/ML  SOLN COMPARISON:  None. FINDINGS: Lower chest: Mild atelectasis is seen within the posterior aspect of the left lung base. There is a small right pleural effusion. A 6.1 cm x 4.4 cm the heterogeneous well-defined soft tissue mass is seen within the anterior aspect of the lower chest wall, along the midline. Hepatobiliary: There is diffuse fatty infiltration  of the liver no  focal liver abnormality is seen. No gallstones, gallbladder wall thickening, or biliary dilatation. Pancreas: Unremarkable. No pancreatic ductal dilatation or surrounding inflammatory changes. Spleen: Normal in size without focal abnormality. Adrenals/Urinary Tract: Adrenal glands are unremarkable. Kidneys are normal in size, without renal calculi or hydronephrosis. A 1.3 cm x 0.9 cm ill-defined mildly hyperdense area is seen within the anterior aspect of the mid left kidney (axial CT image 20, CT series number 3/sagittal reformatted image 82, CT series number 7). The urinary bladder is moderately distended and limited in evaluation secondary to overlying streak artifact. Stomach/Bowel: Stomach is within normal limits. The appendix is not clearly identified. No evidence of bowel dilatation. Numerous decompressed loops of small bowel are suspected within the right abdomen. A very large amount of stool is seen within the distal sigmoid colon and rectum. Vascular/Lymphatic: There is marked severity tortuosity of the abdominal aorta, without evidence of aneurysmal dilatation or dissection. No enlarged abdominal or pelvic lymph nodes. Reproductive: Status post hysterectomy. 2.4 cm x 2.0 cm and 2.5 cm x 2.3 cm well-defined areas of low attenuation are seen along the expected region of the left adnexa (axial CT images 48 through 53, CT series number 3/coronal reformatted images 26 through 38, CT series number 6). Other: No abdominal wall hernia or abnormality. There is a mild amount of abdominopelvic ascites. Musculoskeletal: Bilateral total hip replacements are seen with associated streak artifact and subsequently limited evaluation of the adjacent osseous and soft tissue structures. Moderate severity dextroscoliosis of the lumbar spine is seen with multilevel degenerative changes. IMPRESSION: 1. Heterogeneous soft tissue mass within the anterior aspect of the lower chest wall, concerning for the presence of an underlying  neoplastic process. 2. Small right pleural effusion. 3. Mild amount of abdominopelvic ascites. 4. Fatty liver. 5. Bilateral total hip replacements. 6. Very large amount of stool within the distal sigmoid colon and rectum. 7. Left adnexal masses which may represent large, complex ovarian cysts. Correlation with pelvic ultrasound is recommended. 8. Small hyperdense area within the left kidney which may represent a small renal mass. MRI correlation is recommended. Electronically Signed   By: Aram Candela M.D.   On: 07/27/2020 23:00   DG Chest Port 1 View  Result Date: 07/30/2020 CLINICAL DATA:  Shortness of breath EXAM: PORTABLE CHEST 1 VIEW COMPARISON:  11/23/2018 FINDINGS: Cardiomegaly. There is hyperinflation of the lungs compatible with COPD. No confluent opacities or effusions. No acute bony abnormality. IMPRESSION: Cardiomegaly, COPD. No active disease. Electronically Signed   By: Charlett Nose M.D.   On: 08/14/2020 21:33   ECHOCARDIOGRAM COMPLETE  Result Date: 08/22/2020    ECHOCARDIOGRAM REPORT   Patient Name:   Erica Vasquez Date of Exam: 08/22/2020 Medical Rec #:  [id number]         Height:       60.0 in Accession #:    [id number]        Weight:       100.0 lb Date of Birth:  [birth year]         BSA:          1.390 m Patient Age:    86 years          BP:           161/84 mmHg Patient Gender: F                 HR:           62 bpm. Exam Location:  Inpatient Procedure: 2D Echo, Cardiac Doppler and Color Doppler Indications:    Stroke 434.91 / I163.9  History:        Patient has no prior history of Echocardiogram examinations.                 Risk Factors:Hypertension and Non-Smoker.  Sonographer:    Renella Cunas RDCS Referring Phys: Lang Snow Daylene Katayama [facility] IMPRESSIONS  1. Left ventricular ejection fraction, by estimation, is 25 to 30%. The left ventricle has severely decreased function. There is akinesis of all mid-to-apical LV segments and apex. The basal LV segments have presevered contractility.  Pattern consistent with possible takotsubo cardiomyopathy versus wrap around LAD. There is mild left ventricular hypertrophy of the basal-septal segment. Left ventricular diastolic parameters are indeterminate.  2. Right ventricular systolic function is normal. The right ventricular size is small. There is normal pulmonary artery systolic pressure.  3. Small, circumferential pericardial effusion is present measuring 17mm at end-diastole. There is no evidence of cardiac tamponade. Effusion is greatest along the free wall of the RV.  4. The mitral valve is degenerative. Mild mitral valve regurgitation.  5. Tricuspid valve regurgitation moderate-to-severe central tricuspid regurgitation.  6. The aortic valve is tricuspid. There is moderate calcification of the aortic valve. There is moderate thickening of the aortic valve. There is at least moderate eccentric aortic regurgitation. Comparison(s): No prior Echocardiogram. Conclusion(s)/Recommendation(s): No intracardiac source of embolism detected on this transthoracic study. A transesophageal echocardiogram is recommended to exclude cardiac source of embolism if clinically indicated. FINDINGS  Left Ventricle: Left ventricular ejection fraction, by estimation, is 25 to 30%. The left ventricle has severely decreased function. The left ventricle demonstrates regional wall motion abnormalities. There is akinesis of all mid-to-apical LV segments and apex. The basal LV segments contract normally. Pattern consistent with possible takotsubo cardiomyopathy versus wrap-around LAD. The left ventricular internal cavity size was normal in size. There is mild left ventricular hypertrophy of the basal-septal segment. Left ventricular diastolic parameters are indeterminate. Right Ventricle: The right ventricular size is small. Right vetricular wall thickness was not assessed. Right ventricular systolic function is normal. There is normal pulmonary artery systolic pressure. The tricuspid  regurgitant velocity is 2.41 m/s, and  with an assumed right atrial pressure of 8 mmHg, the estimated right ventricular systolic pressure is 31.2 mmHg. Left Atrium: Left atrial size was normal in size. Right Atrium: Right atrial size was normal in size. Pericardium: Small, circumferential pericardial effusion is present measuring 5mm at end-diastole. There is no evidence of cardiac tamponade. Effusion is greatest along the RV free wall. Mitral Valve: The mitral valve is degenerative in appearance. There is moderate thickening of the mitral valve leaflet(s). There is mild calcification of the mitral valve leaflet(s). Mild mitral valve regurgitation. Tricuspid Valve: The tricuspid valve is normal in structure. Tricuspid valve regurgitation moderate-to-severe central tricuspid regurgitation. Aortic Valve: The aortic valve is tricuspid. There is moderate calcification of the aortic valve. There is moderate thickening of the aortic valve. There is moderate aortic valve annular calcification. There is at least moderate eccentric aortic regurgitation. Mild to moderate aortic valve sclerosis/calcification is present, without any evidence of aortic stenosis. Aortic valve mean gradient measures 2.0 mmHg. Aortic valve peak gradient measures 5.1 mmHg. Aortic valve area, by VTI measures 2.05 cm. Pulmonic Valve: The pulmonic valve was normal in structure. Pulmonic valve regurgitation is trivial. Aorta: The aortic root and ascending aorta are structurally normal, with no evidence of dilitation. IAS/Shunts: No atrial level shunt detected by color  flow Doppler.  LEFT VENTRICLE PLAX 2D LVIDd:         4.10 cm LVIDs:         3.10 cm LV PW:         0.80 cm LV IVS:        0.80 cm LVOT diam:     1.80 cm LV SV:         52 LV SV Index:   37 LVOT Area:     2.54 cm  LV Volumes (MOD) LV vol d, MOD A2C: 107.0 ml LV vol d, MOD A4C: 97.9 ml LV vol s, MOD A2C: 69.8 ml LV vol s, MOD A4C: 64.5 ml LV SV MOD A2C:     37.2 ml LV SV MOD A4C:     97.9  ml LV SV MOD BP:      36.4 ml RIGHT VENTRICLE TAPSE (M-mode): 1.1 cm LEFT ATRIUM           Index       RIGHT ATRIUM           Index LA diam:      2.20 cm 1.58 cm/m  RA Area:     12.80 cm LA Vol (A2C): 22.3 ml 16.04 ml/m RA Volume:   30.80 ml  22.15 ml/m LA Vol (A4C): 12.7 ml 9.13 ml/m  AORTIC VALVE AV Area (Vmax):    1.91 cm AV Area (Vmean):   2.03 cm AV Area (VTI):     2.05 cm AV Vmax:           113.00 cm/s AV Vmean:          64.700 cm/s AV VTI:            0.252 m AV Peak Grad:      5.1 mmHg AV Mean Grad:      2.0 mmHg LVOT Vmax:         84.60 cm/s LVOT Vmean:        51.500 cm/s LVOT VTI:          0.203 m LVOT/AV VTI ratio: 0.81 AI PHT:            535 msec  AORTA Ao Root diam: 2.80 cm TRICUSPID VALVE TR Peak grad:   23.2 mmHg TR Vmax:        241.00 cm/s  SHUNTS Systemic VTI:  0.20 m Systemic Diam: 1.80 cm Laurance Flatten MD Electronically signed by Laurance Flatten MD Signature Date/Time: 08/22/2020/5:09:06 PM    Final     Assessment: 86 year old female presenting with AMS. Two subcentimeter acute to subacute ischemic infarctions are seen on MRI. The patient has also been diagnosed with a PE based on finding of a filling defect within the left pulmonary artery extending into the partially visualized left lobar pulmonary arteries, compatible with pulmonary embolism, as seen on CTA of head/neck.   1. Exam reveals an obtunded, frail appearing female of advanced age with RUE weakness and decreased responsiveness to right sided noxious stimuli.  2. MRI brain: Small acute infarct involving the left corona radiata. Additional small infarct in the right putamen, possibly subacute given associated faint enhancement. Marked mesial temporal lobe predominant atrophy, as can be seen with Alzheimer's. 3. CTA of head and neck: Multifocal severe stenosis of bilateral P1 and P2 PCAs. Severe stenosis of a proximal right M2 MCA branch with multifocal moderate more distal M2 MCA narrowing. Moderate narrowing of a  proximal left M2 MCA branch. Moderate short-segment narrowing of the  left internal carotid artery at the skull base. 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 4. TTE reveals reduced LVEF with severely decreased function, including akinesis of all mid-to-apical LV segments and apex, the pattern being consistent with possible takotsubo cardiomyopathy versus wrap around LAD. A small, circumferential pericardial effusion is present. The mitral valve is degenerative but no vegetation is mentioned in the report. No intracardiac source of embolism detected on the transthoracic study.  5. Abdominal imaging with several findings, including a very large amount of stool within the distal sigmoid colon and rectum. Of note, in frail, demented patients, severe constipation can in some cases contribute to altered mental status.  6. DDx for her encephalopathy is most likely multifactorial, with metabolic encephalopathy most likely the main contributor. The strokes are too small to result in significant AMS on their own. Also see #5 above.   Recommendations: 1. As the acute to subacute strokes seen on MRI are small (less than 1 cm in size), the benefits of anticoagulation for prevention of morbidity/mortality from the PE seen on CTA, significantly outweigh the risks of hemorrhagic conversion of the small strokes. Discussed with family who are amenable to starting anticoagulation. Also discussed with Hospitalist team, who will obtain final informed consent from the family.  2. Cardiac telemetry 3. PT/OT/Speech 4. Benefits of statin most likely outweighed by risks given the patient's advanced age.  5. BP management. Out of permissive HTN time window.  6. Toxic/metabolic/infectious work up.  7. Please call the Neurohospitalist service with any additional questions.   Electronically signed: Dr. Caryl Pina 08/22/2020, 9:59 PM

## 2020-08-22 NOTE — Progress Notes (Signed)
Patient's blood glucose 38 at 1639.  Dr. Elvera Lennox made aware. Amp of D50 given at this time.  Patient's blood glucose 35 at 1746. Dr. Elvera Lennox made aware. 2nd amp of D50 given at this time. Dr. Lenna Gilford STAT BMP.   Patient's vital signs at 1800 were HR 54, BP 157/66, O2 and Temp were unreadable. Dr. Elvera Lennox put orders in to transfer patient at this time. This RN consulted with the Nursing supervisor, Trey Paula RN regarding situation. This RN called a rapid response. Blood sugar of 19 when retaken at 1900 peripherally. Still awaiting bmp to result. Will continue to monitor.

## 2020-08-22 NOTE — ED Notes (Signed)
Hypoglycemic Event  CBG: 19  Treatment: amp D50 Symptoms none  Follow-up CBG: Time:0910 CBG Result:100  Possible Reasons for Event: unknown, possibly because not eating  Comments/MD notified:Gherghe, MD new orders    Gasper Lloyd

## 2020-08-22 DEATH — deceased

## 2020-08-23 DIAGNOSIS — E876 Hypokalemia: Secondary | ICD-10-CM | POA: Diagnosis not present

## 2020-08-23 DIAGNOSIS — E162 Hypoglycemia, unspecified: Secondary | ICD-10-CM | POA: Diagnosis not present

## 2020-08-23 DIAGNOSIS — Z515 Encounter for palliative care: Secondary | ICD-10-CM

## 2020-08-23 DIAGNOSIS — R4182 Altered mental status, unspecified: Secondary | ICD-10-CM

## 2020-08-23 DIAGNOSIS — Z7189 Other specified counseling: Secondary | ICD-10-CM

## 2020-08-23 DIAGNOSIS — F0280 Dementia in other diseases classified elsewhere without behavioral disturbance: Secondary | ICD-10-CM

## 2020-08-23 DIAGNOSIS — G309 Alzheimer's disease, unspecified: Secondary | ICD-10-CM | POA: Diagnosis not present

## 2020-08-23 LAB — LIPID PANEL
Cholesterol: 286 mg/dL — ABNORMAL HIGH (ref 0–200)
HDL: 74 mg/dL (ref >40)
LDL Cholesterol: 204 mg/dL — ABNORMAL HIGH (ref 0–99)
Total CHOL/HDL Ratio: 3.9 RATIO
Triglycerides: 40 mg/dL (ref <150)
VLDL: 8 mg/dL (ref 0–40)

## 2020-08-23 LAB — GLUCOSE, CAPILLARY
Glucose-Capillary: 130 mg/dL — ABNORMAL HIGH (ref 70–99)
Glucose-Capillary: 36 mg/dL — CL (ref 70–99)
Glucose-Capillary: 46 mg/dL — ABNORMAL LOW (ref 70–99)
Glucose-Capillary: 113 mg/dL — ABNORMAL HIGH (ref 70–99)
Glucose-Capillary: 75 mg/dL (ref 70–99)
Glucose-Capillary: 123 mg/dL — ABNORMAL HIGH (ref 70–99)

## 2020-08-23 LAB — BASIC METABOLIC PANEL
Anion gap: 11 (ref 5–15)
Anion gap: 12 (ref 5–15)
BUN: 21 mg/dL (ref 8–23)
BUN: 22 mg/dL (ref 8–23)
CO2: 26 mmol/L (ref 22–32)
CO2: 27 mmol/L (ref 22–32)
Calcium: 8.7 mg/dL — ABNORMAL LOW (ref 8.9–10.3)
Calcium: 8.8 mg/dL — ABNORMAL LOW (ref 8.9–10.3)
Chloride: 106 mmol/L (ref 98–111)
Chloride: 104 mmol/L (ref 98–111)
Creatinine, Ser: 0.68 mg/dL (ref 0.44–1.00)
Creatinine, Ser: 0.79 mg/dL (ref 0.44–1.00)
GFR, Estimated: >60 mL/min (ref >60)
GFR, Estimated: >60 mL/min (ref >60)
Glucose, Bld: 67 mg/dL — ABNORMAL LOW (ref 70–99)
Glucose, Bld: 94 mg/dL (ref 70–99)
Potassium: 3.7 mmol/L (ref 3.5–5.1)
Potassium: 3.6 mmol/L (ref 3.5–5.1)
Sodium: 143 mmol/L (ref 135–145)
Sodium: 143 mmol/L (ref 135–145)

## 2020-08-23 LAB — HEMOGLOBIN A1C
Hgb A1c MFr Bld: 5.2 % (ref 4.8–5.6)
Mean Plasma Glucose: 102.54 mg/dL

## 2020-08-23 LAB — URINE CULTURE: Culture: NO GROWTH

## 2020-08-23 MED ORDER — ENOXAPARIN SODIUM 30 MG/0.3ML ~~LOC~~ SOLN
30.0000 mg | SUBCUTANEOUS | Status: DC
  Administered 2020-08-23: 30 mg via SUBCUTANEOUS
  Filled 2020-08-23: qty 0.3

## 2020-08-23 MED ORDER — VANCOMYCIN HCL 500 MG/100ML IV SOLN
500.0000 mg | INTRAVENOUS | Status: DC
  Administered 2020-08-24: 500 mg via INTRAVENOUS
  Filled 2020-08-23: qty 100

## 2020-08-23 MED ORDER — SODIUM CHLORIDE 0.9 % IV SOLN
2.0000 g | INTRAVENOUS | Status: DC
  Administered 2020-08-23: 2 g via INTRAVENOUS
  Filled 2020-08-23: qty 0.13

## 2020-08-23 MED ORDER — SODIUM CHLORIDE 0.9 % IV SOLN: INTRAVENOUS | Status: DC | PRN

## 2020-08-23 MED ORDER — HEPARIN (PORCINE) 25000 UT/250ML-% IV SOLN
400.0000 [IU]/h | INTRAVENOUS | Status: DC
  Administered 2020-08-23: 400 [IU]/h via INTRAVENOUS
  Filled 2020-08-23: qty 250

## 2020-08-23 MED ORDER — VANCOMYCIN HCL 750 MG/150ML IV SOLN: 750.0000 mg | INTRAVENOUS | Status: DC

## 2020-08-23 NOTE — Progress Notes (Signed)
   08/22/20 2227  Assess: MEWS Score  Temp (!) 97.5 F (36.4 C)  BP 139/83  Pulse Rate (!) 105  Resp 15  Level of Consciousness Responds to Voice  SpO2 100 %  O2 Device Room Air  Assess: MEWS Score  MEWS Temp 0  MEWS Systolic 0  MEWS Pulse 1  MEWS RR 0  MEWS LOC 1  MEWS Score 2  MEWS Score Color Yellow  Assess: if the MEWS score is Yellow or Red  Were vital signs taken at a resting state? Yes  Focused Assessment No change from prior assessment  Early Detection of Sepsis Score *See Row Information* Low  MEWS guidelines implemented *See Row Information* No, previously yellow, continue vital signs every 4 hours  Treat  MEWS Interventions Administered prn meds/treatments  Breathing 0  Negative Vocalization 0  Facial Expression 0  Body Language 0  Consolability 1  PAINAD Score 1  body temp warming up, yellow mews now but still following RED guidelines

## 2020-08-23 NOTE — Progress Notes (Signed)
ANTICOAGULATION CONSULT NOTE - Follow Up Consult  Pharmacy Consult for LMWH --> heparin drip Indication: acute pulmonary embolus and stroke  Allergies  Allergen Reactions  . Bimatoprost Itching  . Brimonidine Itching  . Timolol Itching    Patient Measurements: Height: 5' (152.4 cm) Weight: 32.7 kg (72 lb) IBW/kg (Calculated) : 45.5 Heparin Dosing Weight: 32 kg  Vital Signs: Temp: 98 F (36.7 C) (11/02 0930) Temp Source: Oral (11/02 0930) BP: 110/75 (11/02 0930) Pulse Rate: 95 (11/02 0930)  Labs: Recent Labs    2020-08-23 2035 2020-08-23 2035 08-23-20 2130 08/23/2020 2130 08/22/20 0210 08/22/20 1849 08/23/20 0349  HGB 10.1*   < > 10.2*  --  11.2*  --   --   HCT 32.8*  --  30.0*  --  35.5*  --   --   PLT 101*  --   --   --  122*  --   --   CREATININE 0.92   < > 0.90   < > 0.82 0.64 0.68   < > = values in this interval not displayed.    Estimated Creatinine Clearance: 25.1 mL/min (by C-G formula based on SCr of 0.68 mg/dL).  Assessment: Patient's an 86 y.o F with hx dementia presented to the ED on 11/1 with poor oral intake and AMS. Head/neck CT showed acute PE and brain MRI showed two "subcentimeter acute to subacute ischemic infarctions." Neurologist recom. starting patient on anticoag. as benefits outweigh the risks. She was started on full LMWH with 30 mg dose given at 0100 on 11/2.  Heparin drip is generally preferred for acute stroke. D/W Dr. Lafe Garin, ok to transition patient to heparin drip while inpatient.  Today, 08/23/2020: - scr 0.68 (crcl~25 d/t adv age and low body weight) - hgb 11.2  and plts 122K on 11/1  Goal of Therapy:  Heparin level 0.3-0.5 units/ml Monitor platelets by anticoagulation protocol: Yes   Plan:  - d/c lovenox - start heparin drip at 10pm tonight at 400 units/hr - check 8 hr heparin level after starting drip - monitor for s/sx bleeding  Rasheen Bells P 08/23/2020,10:54 AM

## 2020-08-23 NOTE — Progress Notes (Signed)
Pt has become less responsive as the morning progresses, will grip with left hand if touched, will suck on mouth swabs if placed near mouth. Had been pulling blanket to her mouth to suck on. Unable to obtain a fingerstick CBG. Pts oral temp had come up to 98.8 orally so warming blanket has been turned off to see if pt will maintain this temp while having blankets on her.  At times - respirations were extremely shallow & slow. Dr Elvera Lennox was in and updated on status at 0615, will continue to monitor. Plan to discuss further with son placing pt on full comfort care

## 2020-08-23 NOTE — Progress Notes (Signed)
OT Cancellation Note  Patient Details Name: Erica Vasquez MRN: [id number] DOB: October 10, 1930   Cancelled Treatment:    Reason Eval/Treat Not Completed: Medical issues which prohibited therapy,will follow for appropriate  Intervention if patient's status improves. Recent notes indicate decreased arousal. GOC are to be addressed.  Marlyce Huge OT OT pager: [phone]  Carmelia Roller 08/23/2020, 7:38 AM

## 2020-08-23 NOTE — Progress Notes (Signed)
SLP Cancellation Note  Patient Details Name: Erica Vasquez MRN: [id number] DOB: 08-Aug-1930   Cancelled treatment:       Reason Eval/Treat Not Completed: Patient's level of consciousness. Consult with RN, who reports pt is poorly responsive again today. Will recheck next date.  Demarus Latterell B. Murvin Natal, [facility], CCC-SLP Speech Language Pathologist Office: [phone] Pager: [phone]  [name] 08/23/2020, 10:32 AM

## 2020-08-23 NOTE — Progress Notes (Signed)
PT Cancellation Note  Patient Details Name: Erica Vasquez MRN: [id number] DOB: [birth year]   Cancelled Treatment:    Reason Eval/Treat Not Completed: Medical issues which prohibited therapy,will follow for appropriate  Intervention if patient's status improves. Recent notes indicate decreased arousal. GOC are to be addressed.    Rada Hay 08/23/2020, 7:34 AM Blanchard Kelch PT Acute Rehabilitation Services Pager [phone] Office [phone]

## 2020-08-23 NOTE — Progress Notes (Signed)
Physical Therapy Discharge Patient Details Name: ELZABETH MCQUERRY MRN: [id number] DOB: 1930/04/22 Today's Date: 08/23/2020 Time:  -     Patient discharged from PT services secondary to medical decline - will need to re-order PT to resume therapy services.RN reports patient is not responsive.  GP     Rada Hay 08/23/2020, 1:30 PM Blanchard Kelch PT Acute Rehabilitation Services Pager [phone] Office [phone]

## 2020-08-23 NOTE — Progress Notes (Addendum)
PROGRESS NOTE  Erica Vasquez MWU:[id number] DOB: 1930/10/20 DOA: 08/04/2020 PCP: Mirna MiresHill, [name], MD   LOS: 1 day   Brief Narrative / Interim history: 86 year old female with history of HTN, glaucoma, advanced dementia, presents to the hospital brought by her niece with mental status changes.  Niece reports that patient was not eating and was not talking, she does talk sometimes but is incoherent.  She was just holding her food in her mouth.  She was also found to be hypoglycemic.  Subjective / 24h Interval events: Poorly responsive this morning  Assessment & Plan: Principal Problem Acute CVA, acute metabolic encephalopathy -MRI done on admission 11/1 showed acute CVA. Neurology has been consulted, initially they are planning placed to three transfer to [facility] for stroke team evaluation however this has been held due to patient instability and neurology evaluated patient here. For now transfer is canceled -Continue rectal aspirin, underwent work-up with 2D echo, CT angiogram, lipid panel and A1c. She has remained strict n.p.o. due to unresponsiveness so cannot do statin  Active Problems Advanced dementia, goals of care discussions -Multiple goals of care discussions held yesterday between myself and the son, and also palliative care. He does not seem to have a good understanding of how poor her prognosis is at this point and how little we can do to reverse her medical issues -He eventually agreed to DNR as chest compressions will NOT help her at all if her heart were to stop. He is still struggling with the decision. I was very honest and told him that I do not think she may make it. He is inquiring about a feeding tube however she is too unstable to think about that right now plus it is relatively contraindicated given her advanced dementia  Hypoglycemia, hypothermia -Initial work-up did not reveal any evidence of an infectious process going on. Cortisol and TSH looked okay.  Due to persistent hypoglycemia I ended up starting steroids and given hypothermia I placed her on empiric broad-spectrum antibiotics while monitoring cultures. She remains poorly responsive -Continue amps of D50 every 2 to avoid continuous IV fluids in the setting of acute systolic CHF  Acute systolic CHF -Underwent 2D echo as part of the work-up for CVA which showed an EF of 20-30% with global hypokinesis. Based on imaging it appears to be Takotsubo cardiomyopathy versus wraparound LAD. Cautious with fluids. -Too lethargic to be on oral medications, will not consult cardiology as currently she is not a candidate for invasive evaluations  Possible malignancy -CT scan shows soft tissue mass in the lower chest wall, there is also concern about hyperdense area in the mid left kidney, also left adnexal masses which may represent large, complex ovarian cyst.  This was known to the family and son tells me that she is known about the breast mass for a while now, did not want further work-up in the past and agrees with just observing for now  Acute PE -Picked up CT angio of the head and neck, partially visualized. Neurology thinks anticoagulation should be okay as the strokes are relatively small and the risk for hemorrhagic transformation is small. She was started on anticoagulation overnight, continue.  Hypokalemia -Continue to replete as indicated  Pancytopenia -Likely multifactorial, chronic illness, poor nutrition, monitor  Hyperlipidemia -LDL two hundred and four, strict n.p.o. now  Pressure Injury 08/22/20 Coccyx Posterior;Mid Stage 3 -  Full thickness tissue loss. Subcutaneous fat may be visible but bone, tendon or muscle are NOT exposed. sloughing skin, black  and yellow in color (Active)  08/22/20 1030  Location: Coccyx  Location Orientation: Posterior;Mid  Staging: Stage 3 -  Full thickness tissue loss. Subcutaneous fat may be visible but bone, tendon or muscle are NOT exposed.  Wound  Description (Comments): sloughing skin, black and yellow in color  Present on Admission: Yes     Pressure Injury 08/22/20 Buttocks Left Stage 2 -  Partial thickness loss of dermis presenting as a shallow open injury with a red, pink wound bed without slough. skin tear- small (Active)  08/22/20 1030  Location: Buttocks  Location Orientation: Left  Staging: Stage 2 -  Partial thickness loss of dermis presenting as a shallow open injury with a red, pink wound bed without slough.  Wound Description (Comments): skin tear- small  Present on Admission: Yes    Scheduled Meds: . aspirin  300 mg Rectal Daily  . dextrose  25 mL Intravenous Q2H  . enoxaparin (LOVENOX) injection  30 mg Subcutaneous Q24H  . feeding supplement  237 mL Oral BID BM  . hydrocortisone sod succinate (SOLU-CORTEF) inj  50 mg Intravenous Q8H  . insulin aspart  0-9 Units Subcutaneous TID WC  . lactulose  20 g Oral BID  . multivitamin with minerals  1 tablet Oral Daily  . pantoprazole  40 mg Oral Daily  . senna-docusate  2 tablet Oral BID   Continuous Infusions: . sodium chloride Stopped (08/23/20 0615)  . ceFEPime (MAXIPIME) IV    . [START ON 08/24/2020] vancomycin     PRN Meds:.sodium chloride, acetaminophen **OR** acetaminophen, HYDROcodone-acetaminophen, methocarbamol, ondansetron **OR** ondansetron (ZOFRAN) IV, polyethylene glycol  Diet Orders (From admission, onward)    Start     Ordered   08/22/20 1551  Diet NPO time specified  Diet effective now        08/22/20 1551          DVT prophylaxis: enoxaparin (LOVENOX) injection 30 mg Start: 08/23/20 0030 SCDs Start: 08/22/20 0114     Code Status: DNR  Family Communication: Son present at bedside  Status is: Inpatient  Remains inpatient appropriate because:Hemodynamically unstable   Dispo: The patient is from: Home              Anticipated d/c is to: Home              Anticipated d/c date is: > 3 days              Patient currently is not medically  stable to d/c.  Consultants:  Neurology Palliative  Procedures:  None  Microbiology  Blood cultures-pending  Antimicrobials: Vancomycin 11/1 >> Cefepime 11/1 >>   Objective: Vitals:   08/23/20 0300 08/23/20 0543 08/23/20 0918 08/23/20 0930  BP: 112/73 122/77 104/65 110/75  Pulse: 96 (!) 103 67 95  Resp: (!) 8 15 13 14   Temp: 97.7 F (36.5 C) 98.8 F (37.1 C) 98.4 F (36.9 C) 98 F (36.7 C)  TempSrc: Oral Oral Oral Oral  SpO2: 100% 100%  100%  Weight:      Height:        Intake/Output Summary (Last 24 hours) at 08/23/2020 1046 Last data filed at 08/22/2020 1600 Gross per 24 hour  Intake 700 ml  Output 250 ml  Net 450 ml   Filed Weights   08/22/20 2227  Weight: 32.7 kg    Examination:  Constitutional: Unresponsive, very frail and cachectic, withdraws to touch Eyes: No icterus seen ENMT: Mucous membranes are moist.  Neck: normal, supple Respiratory: Shallow  respirations, no wheezing or crackles heard. Cardiovascular: Regular rate and rhythm, no murmurs / rubs / gallops. No LE edema. Abdomen: non distended, no tenderness. Bowel sounds positive.  Skin: no rashes Neurologic: Unable to assess due to not following commands however appears to withdraw symmetrically to touch  Data Reviewed: I have independently reviewed following labs and imaging studies   CBC: Recent Labs  Lab 08/04/2020 2035 07/29/2020 2130 08/22/20 0210  WBC 3.4*  --  3.8*  NEUTROABS 2.9  --  3.2  HGB 10.1* 10.2* 11.2*  HCT 32.8* 30.0* 35.5*  MCV 91.9  --  90.6  PLT 101*  --  122*   Basic Metabolic Panel: Recent Labs  Lab 08/08/2020 2035 08/16/2020 2035 07/28/2020 2130 08/22/20 0210 08/22/20 1039 08/22/20 1849 08/23/20 0349  NA 140  --  144 145  --  140 143  K 2.4*   < > 2.4* 2.8* 3.3* 3.7 3.7  CL 103  --  102 106  --  102 106  CO2 26  --   --  29  --  28 26  GLUCOSE 773*  --  536* 220*  --  285* 67*  BUN 33*  --  37* 31*  --  21 21  CREATININE 0.92  --  0.90 0.82  --  0.64 0.68   CALCIUM 8.7*  --   --  8.7*  --  8.1* 8.7*  MG 2.1  --   --  2.2  --   --   --    < > = values in this interval not displayed.   Liver Function Tests: Recent Labs  Lab 08/08/2020 2035 08/22/20 0210  AST 23 30  ALT 12 11  ALKPHOS 56 60  BILITOT 0.9 1.0  PROT 5.0* 5.5*  ALBUMIN 2.9* 3.2*   Coagulation Profile: No results for input(s): INR, PROTIME in the last 168 hours. HbA1C: Recent Labs    08/22/20 0210 08/23/20 0349  HGBA1C 5.2 5.2   CBG: Recent Labs  Lab 08/22/20 1639 08/22/20 1746 08/22/20 1855 08/22/20 2124 08/23/20 0734  GLUCAP 38* 35* 19* 224* 130*    Recent Results (from the past 240 hour(s))  Culture, blood (routine x 2)     Status: None (Preliminary result)   Collection Time: 07/27/2020  8:35 PM   Specimen: BLOOD RIGHT HAND  Result Value Ref Range Status   Specimen Description   Final    BLOOD RIGHT HAND Performed at [facility], [address], [place], [place] [address]    Special Requests   Final    BOTTLES DRAWN AEROBIC ONLY Blood Culture adequate volume   Culture   Final    NO GROWTH 1 DAY Performed at [facility], [address], [place], [place] [address]    Report Status PENDING  Incomplete  Culture, blood (routine x 2)     Status: None (Preliminary result)   Collection Time: 08/08/2020  8:36 PM   Specimen: BLOOD RIGHT FOREARM  Result Value Ref Range Status   Specimen Description   Final    BLOOD RIGHT FOREARM Performed at [facility], [address] [place], [place] [address]    Special Requests   Final    BOTTLES DRAWN AEROBIC AND ANAEROBIC Blood Culture adequate volume Performed at [facility], [address] [place], [place] [address]    Culture   Final    NO GROWTH 1 DAY Performed at [facility], [address] [place], [place] [address]    Report Status PENDING  Incomplete  Respiratory Panel by RT PCR (Flu A&B, Covid) - Nasopharyngeal Swab     Status:  None   Collection Time: 09-17-20  9:12 PM   Specimen: Nasopharyngeal Swab  Result Value Ref Range Status   SARS Coronavirus 2 by RT PCR NEGATIVE NEGATIVE Final    Comment: (NOTE) SARS-CoV-2 target nucleic acids are NOT DETECTED.  The SARS-CoV-2 RNA is generally detectable in upper respiratoy specimens during the acute phase of infection. The lowest concentration of SARS-CoV-2 viral copies this assay can detect is 131 copies/mL. A negative result does not preclude SARS-Cov-2 infection and should not be used as the sole basis for treatment or other patient management decisions. A negative result may occur with  improper specimen collection/handling, submission of specimen other than nasopharyngeal swab, presence of viral mutation(s) within the areas targeted by this assay, and inadequate number of viral copies (<131 copies/mL). A negative result must be combined with clinical observations, patient history, and epidemiological information. The expected result is Negative.  Fact Sheet for Patients:  https://www.moore.com/  Fact Sheet for Healthcare Providers:  https://www.young.biz/  This test is no t yet approved or cleared by the [place] FDA and  has been authorized for detection and/or diagnosis of SARS-CoV-2 by FDA under an Emergency Use Authorization (EUA). This EUA will remain  in effect (meaning this test can be used) for the duration of the COVID-19 declaration under Section 564(b)(1) of the Act, 21 U.S.C. section 360bbb-3(b)(1), unless the authorization is terminated or revoked sooner.     Influenza A by PCR NEGATIVE NEGATIVE Final   Influenza B by PCR NEGATIVE NEGATIVE Final    Comment: (NOTE) The Xpert Xpress SARS-CoV-2/FLU/RSV assay is intended as an aid in  the diagnosis of influenza from Nasopharyngeal swab specimens and  should not be used as a sole basis for treatment. Nasal washings and  aspirates are unacceptable for  Xpert Xpress SARS-CoV-2/FLU/RSV  testing.  Fact Sheet for Patients: https://www.moore.com/  Fact Sheet for Healthcare Providers: https://www.young.biz/  This test is not yet approved or cleared by the [place] FDA and  has been authorized for detection and/or diagnosis of SARS-CoV-2 by  FDA under an Emergency Use Authorization (EUA). This EUA will remain  in effect (meaning this test can be used) for the duration of the  Covid-19 declaration under Section 564(b)(1) of the Act, 21  U.S.C. section 360bbb-3(b)(1), unless the authorization is  terminated or revoked. Performed at [facility], [address], [place], [place] [address]   Culture, Urine     Status: None   Collection Time: 09-17-2020 11:29 PM   Specimen: Urine, Clean Catch  Result Value Ref Range Status   Specimen Description   Final    URINE, CLEAN CATCH Performed at [facility], [address], [place], [place] [address]    Special Requests   Final    NONE Performed at [facility], [address] [place], [place] [address]    Culture   Final    NO GROWTH Performed at [facility], [address] [place], [place] [address]    Report Status 08/23/2020 FINAL  Final     Radiology Studies: CT ANGIO HEAD W OR WO CONTRAST  Result Date: 08/22/2020 CLINICAL DATA:  Stroke/TIA, assess intracranial artery. Mental status change with advanced dementia. EXAM: CT HEAD WITHOUT CONTRAST CT ANGIOGRAPHY OF THE HEAD AND NECK TECHNIQUE: Contiguous  axial images were obtained from the base of the skull through the vertex without intravenous contrast. Multidetector CT imaging of the head and neck was performed using the standard protocol during bolus administration of intravenous contrast. Multiplanar CT image reconstructions and MIPs were obtained to evaluate the vascular anatomy. Carotid stenosis measurements (when applicable) are  obtained utilizing NASCET criteria, using the distal internal carotid diameter as the denominator. CONTRAST:  OMNIPAQUE IOHEXOL 350 MG/ML SOLN COMPARISON:  Same day MRI.  MRA 10/10/2009 FINDINGS: CT HEAD Brain: Small infarcts are better characterized on same day MRI. No evidence of acute hemorrhage, hydrocephalus, extra-axial collection, mass lesion or abnormal mass effect. Chronic microvascular ischemic change and diffuse cerebral atrophy with ex vacuo ventricular dilation Vascular: Calcific atherosclerosis. Skull: No acute fracture. Sinuses/Orbits: No acute findings. CTA NECK Aortic arch: Great vessel origins are patent. Right carotid system: Tortuous, retropharyngeal course. No evidence of significant stenosis. Left carotid system: Tortuous, retropharyngeal course. There is moderate short segment narrowing of the internal carotid artery just proximal to the skull base. Vertebral arteries:Left dominant. No evidence of significant stenosis or occlusion. Tortuous. Skeleton: Multilevel severe degenerative disc disease of the cervical spine. Other neck: Cachectic with very little fat within the neck. This limits evaluation without obvious mass or adenopathy. Upper Chest: Filling defect within the left pulmonary artery extending into the partially visualized left lobar pulmonary arteries, compatible with pulmonary embolism. CTA HEAD Anterior circulation: Predominately calcific atherosclerosis of the left cavernous carotid artery without evidence of greater than 50% stenosis. Severe narrowing of a proximal right M2 MCA branch (see series 10, image 124) with multifocal moderate narrowing of the more distal M2 MCA branches. Moderate narrowing of a proximal left M2 MCA branch (see series 10, image 115). 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 Posterior circulation: Dysplastic appearance of the proximal basilar artery, which is widely patent.  Multifocal severe stenosis of bilateral P1 and P2 PCAs. Venous sinuses: Poorly visualized given arterial timing. Within this limitation, no at evidence of dural sinus thrombosis. IMPRESSION: 1. Filling defect within the left pulmonary artery extending into the partially visualized left lobar pulmonary arteries, compatible with pulmonary embolism. Recommend CTA of the chest when renal function allows to further evaluate and to also exclude hilar malignancy. 2. Multifocal severe stenosis of bilateral P1 and P2 PCAs. 3. Severe stenosis of a proximal right M2 MCA branch with multifocal moderate more distal M2 MCA narrowing. 4. Moderate narrowing of a proximal left M2 MCA branch. 5. Moderate short-segment narrowing of the left internal carotid artery at the skull base. 6. 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 Recommend follow-up CTA in 1 year to ensure stability. 7. Known small infarcts better characterized on same day MRI. Critical findings were called by telephone at the time of interpretation on 08/22/2020 at 1:56 pm to provider [facility] , who verbally acknowledged these results. Additional findings were discussed with Yurem Viner at 2:08 p.m. via telephone. Electronically Signed   By: Feliberto Harts MD   On: 08/22/2020 14:15   CT ANGIO NECK W OR WO CONTRAST  Result Date: 08/22/2020 CLINICAL DATA:  Stroke/TIA, assess intracranial artery. Mental status change with advanced dementia. EXAM: CT HEAD WITHOUT CONTRAST CT ANGIOGRAPHY OF THE HEAD AND NECK TECHNIQUE: Contiguous axial images were obtained from the base of the skull through the vertex without intravenous contrast. Multidetector CT imaging of the head and neck was performed using the standard  protocol during bolus administration of intravenous contrast. Multiplanar CT image reconstructions and MIPs were obtained to evaluate the vascular anatomy. Carotid stenosis measurements (when applicable) are  obtained utilizing NASCET criteria, using the distal internal carotid diameter as the denominator. CONTRAST:  OMNIPAQUE IOHEXOL 350 MG/ML SOLN COMPARISON:  Same day MRI.  MRA 10/10/2009 FINDINGS: CT HEAD Brain: Small infarcts are better characterized on same day MRI. No evidence of acute hemorrhage, hydrocephalus, extra-axial collection, mass lesion or abnormal mass effect. Chronic microvascular ischemic change and diffuse cerebral atrophy with ex vacuo ventricular dilation Vascular: Calcific atherosclerosis. Skull: No acute fracture. Sinuses/Orbits: No acute findings. CTA NECK Aortic arch: Great vessel origins are patent. Right carotid system: Tortuous, retropharyngeal course. No evidence of significant stenosis. Left carotid system: Tortuous, retropharyngeal course. There is moderate short segment narrowing of the internal carotid artery just proximal to the skull base. Vertebral arteries:Left dominant. No evidence of significant stenosis or occlusion. Tortuous. Skeleton: Multilevel severe degenerative disc disease of the cervical spine. Other neck: Cachectic with very little fat within the neck. This limits evaluation without obvious mass or adenopathy. Upper Chest: Filling defect within the left pulmonary artery extending into the partially visualized left lobar pulmonary arteries, compatible with pulmonary embolism. CTA HEAD Anterior circulation: Predominately calcific atherosclerosis of the left cavernous carotid artery without evidence of greater than 50% stenosis. Severe narrowing of a proximal right M2 MCA branch (see series 10, image 124) with multifocal moderate narrowing of the more distal M2 MCA branches. Moderate narrowing of a proximal left M2 MCA branch (see series 10, image 115). 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 Posterior circulation: Dysplastic appearance of the proximal basilar artery, which is widely patent.  Multifocal severe stenosis of bilateral P1 and P2 PCAs. Venous sinuses: Poorly visualized given arterial timing. Within this limitation, no at evidence of dural sinus thrombosis. IMPRESSION: 1. Filling defect within the left pulmonary artery extending into the partially visualized left lobar pulmonary arteries, compatible with pulmonary embolism. Recommend CTA of the chest when renal function allows to further evaluate and to also exclude hilar malignancy. 2. Multifocal severe stenosis of bilateral P1 and P2 PCAs. 3. Severe stenosis of a proximal right M2 MCA branch with multifocal moderate more distal M2 MCA narrowing. 4. Moderate narrowing of a proximal left M2 MCA branch. 5. Moderate short-segment narrowing of the left internal carotid artery at the skull base. 6. 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 Recommend follow-up CTA in 1 year to ensure stability. 7. Known small infarcts better characterized on same day MRI. Critical findings were called by telephone at the time of interpretation on 08/22/2020 at 1:56 pm to provider [facility] , who verbally acknowledged these results. Additional findings were discussed with Kimani Hovis at 2:08 p.m. via telephone. Electronically Signed   By: Feliberto Harts MD   On: 08/22/2020 14:15   ECHOCARDIOGRAM COMPLETE  Result Date: 08/22/2020    ECHOCARDIOGRAM REPORT   Patient Name:   LUCCIANA HEAD Date of Exam: 08/22/2020 Medical Rec #:  [id number]         Height:       60.0 in Accession #:    [id number]        Weight:       100.0 lb Date of Birth:  January 01, 1930         BSA:          1.390 m Patient Age:  86 years          BP:           161/84 mmHg Patient Gender: F                 HR:           62 bpm. Exam Location:  Inpatient Procedure: 2D Echo, Cardiac Doppler and Color Doppler Indications:    Stroke 434.91 / I163.9  History:        Patient has no prior history of Echocardiogram examinations.                 Risk  Factors:Hypertension and Non-Smoker.  Sonographer:    Renella Cunas RDCS Referring Phys: Lang Snow Daylene Katayama [facility] IMPRESSIONS  1. Left ventricular ejection fraction, by estimation, is 25 to 30%. The left ventricle has severely decreased function. There is akinesis of all mid-to-apical LV segments and apex. The basal LV segments have presevered contractility. Pattern consistent with possible takotsubo cardiomyopathy versus wrap around LAD. There is mild left ventricular hypertrophy of the basal-septal segment. Left ventricular diastolic parameters are indeterminate.  2. Right ventricular systolic function is normal. The right ventricular size is small. There is normal pulmonary artery systolic pressure.  3. Small, circumferential pericardial effusion is present measuring 8mm at end-diastole. There is no evidence of cardiac tamponade. Effusion is greatest along the free wall of the RV.  4. The mitral valve is degenerative. Mild mitral valve regurgitation.  5. Tricuspid valve regurgitation moderate-to-severe central tricuspid regurgitation.  6. The aortic valve is tricuspid. There is moderate calcification of the aortic valve. There is moderate thickening of the aortic valve. There is at least moderate eccentric aortic regurgitation. Comparison(s): No prior Echocardiogram. Conclusion(s)/Recommendation(s): No intracardiac source of embolism detected on this transthoracic study. A transesophageal echocardiogram is recommended to exclude cardiac source of embolism if clinically indicated. FINDINGS  Left Ventricle: Left ventricular ejection fraction, by estimation, is 25 to 30%. The left ventricle has severely decreased function. The left ventricle demonstrates regional wall motion abnormalities. There is akinesis of all mid-to-apical LV segments and apex. The basal LV segments contract normally. Pattern consistent with possible takotsubo cardiomyopathy versus wrap-around LAD. The left ventricular internal cavity size was  normal in size. There is mild left ventricular hypertrophy of the basal-septal segment. Left ventricular diastolic parameters are indeterminate. Right Ventricle: The right ventricular size is small. Right vetricular wall thickness was not assessed. Right ventricular systolic function is normal. There is normal pulmonary artery systolic pressure. The tricuspid regurgitant velocity is 2.41 m/s, and  with an assumed right atrial pressure of 8 mmHg, the estimated right ventricular systolic pressure is 31.2 mmHg. Left Atrium: Left atrial size was normal in size. Right Atrium: Right atrial size was normal in size. Pericardium: Small, circumferential pericardial effusion is present measuring 8mm at end-diastole. There is no evidence of cardiac tamponade. Effusion is greatest along the RV free wall. Mitral Valve: The mitral valve is degenerative in appearance. There is moderate thickening of the mitral valve leaflet(s). There is mild calcification of the mitral valve leaflet(s). Mild mitral valve regurgitation. Tricuspid Valve: The tricuspid valve is normal in structure. Tricuspid valve regurgitation moderate-to-severe central tricuspid regurgitation. Aortic Valve: The aortic valve is tricuspid. There is moderate calcification of the aortic valve. There is moderate thickening of the aortic valve. There is moderate aortic valve annular calcification. There is at least moderate eccentric aortic regurgitation. Mild to moderate aortic valve sclerosis/calcification is present, without any evidence of aortic stenosis.  Aortic valve mean gradient measures 2.0 mmHg. Aortic valve peak gradient measures 5.1 mmHg. Aortic valve area, by VTI measures 2.05 cm. Pulmonic Valve: The pulmonic valve was normal in structure. Pulmonic valve regurgitation is trivial. Aorta: The aortic root and ascending aorta are structurally normal, with no evidence of dilitation. IAS/Shunts: No atrial level shunt detected by color flow Doppler.  LEFT VENTRICLE  PLAX 2D LVIDd:         4.10 cm LVIDs:         3.10 cm LV PW:         0.80 cm LV IVS:        0.80 cm LVOT diam:     1.80 cm LV SV:         52 LV SV Index:   37 LVOT Area:     2.54 cm  LV Volumes (MOD) LV vol d, MOD A2C: 107.0 ml LV vol d, MOD A4C: 97.9 ml LV vol s, MOD A2C: 69.8 ml LV vol s, MOD A4C: 64.5 ml LV SV MOD A2C:     37.2 ml LV SV MOD A4C:     97.9 ml LV SV MOD BP:      36.4 ml RIGHT VENTRICLE TAPSE (M-mode): 1.1 cm LEFT ATRIUM           Index       RIGHT ATRIUM           Index LA diam:      2.20 cm 1.58 cm/m  RA Area:     12.80 cm LA Vol (A2C): 22.3 ml 16.04 ml/m RA Volume:   30.80 ml  22.15 ml/m LA Vol (A4C): 12.7 ml 9.13 ml/m  AORTIC VALVE AV Area (Vmax):    1.91 cm AV Area (Vmean):   2.03 cm AV Area (VTI):     2.05 cm AV Vmax:           113.00 cm/s AV Vmean:          64.700 cm/s AV VTI:            0.252 m AV Peak Grad:      5.1 mmHg AV Mean Grad:      2.0 mmHg LVOT Vmax:         84.60 cm/s LVOT Vmean:        51.500 cm/s LVOT VTI:          0.203 m LVOT/AV VTI ratio: 0.81 AI PHT:            535 msec  AORTA Ao Root diam: 2.80 cm TRICUSPID VALVE TR Peak grad:   23.2 mmHg TR Vmax:        241.00 cm/s  SHUNTS Systemic VTI:  0.20 m Systemic Diam: 1.80 cm Laurance Flatten MD Electronically signed by Laurance Flatten MD Signature Date/Time: 08/22/2020/5:09:06 PM    Final    Pamella Pert, MD, PhD [facility]  Between 7 am - 7 pm I am available, please contact me via Amion or Securechat  Between 7 pm - 7 am I am not available, please contact night coverage MD/APP via Amion

## 2020-08-23 NOTE — Evaluation (Addendum)
Clinical/Bedside Swallow Evaluation Patient Details  Name: Erica Vasquez MRN: [id number] Date of Birth: 28-Jan-1930  Today's Date: 08/23/2020 Time: SLP Start Time (ACUTE ONLY): 1525 SLP Stop Time (ACUTE ONLY): 1545 SLP Time Calculation (min) (ACUTE ONLY): 20 min  Past Medical History:  Past Medical History:  Diagnosis Date  . Alzheimer disease (HCC)    mild  . Arthritis   . Blind   . Cataract   . Cataract cortical, senile   . Dementia (HCC)   . Essential hypertension   . Glaucoma   . Hypertension    Past Surgical History:  Past Surgical History:  Procedure Laterality Date  . ABDOMINAL HYSTERECTOMY    . CATARACT EXTRACTION    . GLAUCOMA SURGERY Bilateral 2010   SLT  . HIP ARTHROPLASTY Left 11/23/2018   Procedure: ARTHROPLASTY BIPOLAR HIP (HEMIARTHROPLASTY);  Surgeon: Eldred Manges, MD;  Location: [facility] OR;  Service: Orthopedics;  Laterality: Left;  . TOTAL HIP ARTHROPLASTY Right 01/26/2018   Procedure: TOTAL HIP ARTHROPLASTY ANTERIOR APPROACH;  Surgeon: Tarry Kos, MD;  Location: [facility] OR;  Service: Orthopedics;  Laterality: Right;   HPI:  86yo female admitted 08/04/2020 with AMS. PMH: HTN, glaucoma, severe dementia, arthritis. MRI = small acute infarct left corona radiata and right putamen.   Assessment / Plan / Recommendation Clinical Impression  Pt was seen for limited clinical swallow evaluation. Pt is edentulous, nonvocal, and does not follow commands. She accepted trials of ice chips and puree. She was unable to demonstrate ability to drink from a cup or straw at this time. Extended oral prep noted with ice chips. Audible swallow elicited. Puree trials were also given - pt exhibited extended oral prep with oral holding noted.   NPO status is recommended given significance of pt's cognitively based dysphagia. She is at significantly high risk of aspiration. If family decides to pursue comfort feeds with known risk of aspiration, a more conservative diet of puree textures and  nectar thickened liquids would be preferable to solid textures and thin liquids. Recommend oral suction to be set up in pt room to facilitate thorough and effective oral care.   SLP Visit Diagnosis: Dysphagia, unspecified (R13.10)    Aspiration Risk  Severe aspiration risk;Risk for inadequate nutrition/hydration    Diet Recommendation NPO   Medication Administration: Via alternative means    Other  Recommendations Oral Care Recommendations: Oral care QID Other Recommendations: Have oral suction available   Follow up Recommendations 24 hour supervision/assistance      Frequency and Duration min 1 x/week  1 week       Prognosis Prognosis for Safe Diet Advancement: Guarded Barriers to Reach Goals: Cognitive deficits      Swallow Study   General Date of Onset: 08/19/2020 HPI: 86yo female admitted 07/25/2020 with AMS. PMH: HTN, glaucoma, severe dementia, arthritis. MRI = small acute infarct left corona radiata and right putamen. Type of Study: Bedside Swallow Evaluation Previous Swallow Assessment: none Diet Prior to this Study: NPO Temperature Spikes Noted: No Respiratory Status: Nasal cannula History of Recent Intubation: No Behavior/Cognition: Doesn't follow directions;Requires cueing;Lethargic/Drowsy Oral Cavity Assessment: Dry Oral Care Completed by SLP: No Oral Cavity - Dentition: Edentulous Vision: Impaired for self-feeding Self-Feeding Abilities: Total assist Patient Positioning: Upright in bed Baseline Vocal Quality: Aphonic Volitional Cough: Cognitively unable to elicit Volitional Swallow: Unable to elicit    Oral/Motor/Sensory Function Overall Oral Motor/Sensory Function:  (unable to assess)   Ice Chips Ice chips: Impaired Presentation: Spoon Oral Phase Impairments: Reduced labial seal;Poor  awareness of bolus Oral Phase Functional Implications: Prolonged oral transit Pharyngeal Phase Impairments: Other (comments);Suspected delayed Swallow (audible swallow)    Thin Liquid Thin Liquid: Impaired Oral Phase Impairments: Reduced labial seal;Reduced lingual movement/coordination;Poor awareness of bolus Oral Phase Functional Implications: Left anterior spillage Pharyngeal  Phase Impairments: Unable to trigger swallow    Puree Puree: Impaired Presentation: Spoon Oral Phase Impairments: Reduced lingual movement/coordination;Reduced labial seal;Poor awareness of bolus Oral Phase Functional Implications: Oral residue Pharyngeal Phase Impairments: Suspected delayed Swallow   Solid           Erica Vasquez B. Murvin Natal, [facility], CCC-SLP Speech Language Pathologist Office: [phone] Pager: [phone]  Erica Vasquez 08/23/2020,3:51 PM

## 2020-08-23 NOTE — Progress Notes (Signed)
Daily Progress Note   Patient Name: Erica Vasquez       Date: 08/23/2020 DOB: [birth year]  Age: 86 y.o. MRN#: [id number] Attending Physician: Leatha Gilding, MD Primary Care Physician: Mirna Mires, MD Admit Date: 08/15/2020  Reason for Consultation/Follow-up: Establishing goals of care  Subjective: Noted patient with difficult night last night with drop in temperature.  She has ongoing hypoglycemia requiring frequent boluses of dextrose. On my exam Erica Vasquez holds my hand with her left hand and a strong grip.  But does not follow commands and there is no movement of her right upper extremity.    With mouth care is provided with a swab she reaches for the swab and is able to hold it in her mouth and suck.  When she does not have a swab in her hand she puts her fingers or blanket in her mouth.  Her niece who is at bedside indicates that this is what she does when she is hungry. She is not verbalizing. I had discussions with both patient's son and patient's niece.  I discussed with them the overall trajectory of Alzheimer's dementia and decline that patient has been presenting with.  I discussed with them decline from dementia is progressive and terminal.  I shared with them my concerns that while we are continuing attempts to reverse what we can i.e. giving her blood thinner for pulmonary embolus, IV fluids and pushes of dextrose for low blood sugars-which, we must be careful with due to her heart failure with echo showing ejection fraction at 25 to 30%, that there continues to be high risk for her dying despite all these interventions.  Additionally it is possible she has an underlying malignancy that is furthering her decline given the mass on her chest. I further discussed with them that  patient is likely at end-of-life no matter what our interventions are and that while we are able to prolong and keep her body alive, it is very unlikely that we will be able to restore her to the level of function that she was prior to this admission. I reviewed options for transition to full comfort measures, allowing all family members to visit that with desire, and supporting patient through end-of-life with dignity and comfort.  Review of Systems  Unable to perform ROS: Dementia  Length of Stay: 1  Current Medications: Scheduled Meds:  . aspirin  300 mg Rectal Daily  . dextrose  25 mL Intravenous Q2H  . feeding supplement  237 mL Oral BID BM  . hydrocortisone sod succinate (SOLU-CORTEF) inj  50 mg Intravenous Q8H  . lactulose  20 g Oral BID  . multivitamin with minerals  1 tablet Oral Daily  . pantoprazole  40 mg Oral Daily  . senna-docusate  2 tablet Oral BID    Continuous Infusions: . sodium chloride Stopped (08/23/20 0615)  . ceFEPime (MAXIPIME) IV    . heparin    . [START ON 08/24/2020] vancomycin      PRN Meds: sodium chloride, acetaminophen **OR** acetaminophen, HYDROcodone-acetaminophen, methocarbamol, ondansetron **OR** ondansetron (ZOFRAN) IV, polyethylene glycol  Physical Exam Vitals and nursing note reviewed.  Constitutional:      Comments: cachectic  Skin:    General: Skin is warm and dry.  Neurological:     Comments: Nonverbal, RUE flacid, strong grip with L hand, does not follow commands             Vital Signs: BP (!) 141/69 (BP Location: Left Arm)   Pulse 90   Temp (!) 97.4 F (36.3 C) (Oral)   Resp 14   Ht 5' (1.524 m)   Wt 32.7 kg   SpO2 100%   BMI 14.06 kg/m  SpO2: SpO2: 100 % O2 Device: O2 Device: Nasal Cannula O2 Flow Rate: O2 Flow Rate (L/min): 2 L/min  Intake/output summary:   Intake/Output Summary (Last 24 hours) at 08/23/2020 1445 Last data filed at 08/22/2020 1600 Gross per 24 hour  Intake 250 ml  Output --  Net 250 ml    LBM:   Baseline Weight: Weight: 32.7 kg Most recent weight: Weight: 32.7 kg       Palliative Assessment/Data: PPS: 10%     Patient Active Problem List   Diagnosis Date Noted  . Acute metabolic encephalopathy 08/22/2020  . CVA (cerebral vascular accident) (HCC) 08/22/2020  . Pressure injury of skin 08/22/2020  . Advanced care planning/counseling discussion   . Hypoglycemia   . Dementia without behavioral disturbance (HCC)   . Goals of care, counseling/discussion   . Postoperative anemia due to acute blood loss 11/27/2018  . Hip fracture (HCC) 11/23/2018  . Chronic pain of right knee 03/11/2018  . Pain in right hip 02/10/2018  . Anemia 02/04/2018  . Constipation due to opioid therapy 01/31/2018  . Alzheimer disease (HCC) 01/31/2018  . Malnutrition of moderate degree 01/27/2018  . Fall 01/25/2018  . Blindness 01/25/2018  . Hypokalemia 01/25/2018  . Lytic bone lesion of hip 01/25/2018  . Essential hypertension, benign     Palliative Care Assessment & Plan   Patient Profile: 86 y.o. female  with past medical history of blindness, advanced dementia, chest mass that has never been diagnosed, admitted on 09/15/20 with altered mental status, hypoglycemia, pancytopenia, hypokalemia, bradycardia.  Work-up revealed acute CVA, and pulmonary embolism.  She is mostly nonresponsive. Palliative consulted for goals of care.   Assessment/Recommendations/Plan  PMT will continue ongoing goals of care discussion with patient's son and niece Patient's niece acknowledged that many family members have expressed their feelings that patient is at end-of-life, however this is very difficult for patient's son to accept given his deep involvement in her care,  and love for her.   Goals of Care and Additional Recommendations: Limitations on Scope of Treatment: Full Scope Treatment  Code Status: DNR  Prognosis:  Unable to  determine-would likely be days or weeks of patient's transition fully  to comfort measures only, she likely has less than months even with ongoing aggressive care  Discharge Planning: To Be Determined  Care plan was discussed with patient's son and niece  Thank you for allowing the Palliative Medicine Team to assist in the care of this patient.   Total time: 68 mins  Greater than 50%  of this time was spent counseling and coordinating care related to the above assessment and plan.  Ocie Bob, AGNP-C Palliative Medicine   Please contact Palliative Medicine Team phone at [phone] for questions and concerns.

## 2020-08-24 DIAGNOSIS — E876 Hypokalemia: Secondary | ICD-10-CM | POA: Diagnosis not present

## 2020-08-24 DIAGNOSIS — E162 Hypoglycemia, unspecified: Secondary | ICD-10-CM

## 2020-08-24 LAB — COMPREHENSIVE METABOLIC PANEL
ALT: 9 U/L (ref 0–44)
AST: 24 U/L (ref 15–41)
Albumin: 2.6 g/dL — ABNORMAL LOW (ref 3.5–5.0)
Alkaline Phosphatase: 50 U/L (ref 38–126)
Anion gap: 9 (ref 5–15)
BUN: 24 mg/dL — ABNORMAL HIGH (ref 8–23)
CO2: 26 mmol/L (ref 22–32)
Calcium: 8.6 mg/dL — ABNORMAL LOW (ref 8.9–10.3)
Chloride: 105 mmol/L (ref 98–111)
Creatinine, Ser: 0.77 mg/dL (ref 0.44–1.00)
GFR, Estimated: >60 mL/min (ref >60)
Glucose, Bld: 165 mg/dL — ABNORMAL HIGH (ref 70–99)
Potassium: 3.6 mmol/L (ref 3.5–5.1)
Sodium: 140 mmol/L (ref 135–145)
Total Bilirubin: 1.1 mg/dL (ref 0.3–1.2)
Total Protein: 4.7 g/dL — ABNORMAL LOW (ref 6.5–8.1)

## 2020-08-24 LAB — PHOSPHORUS: Phosphorus: 2.4 mg/dL — ABNORMAL LOW (ref 2.5–4.6)

## 2020-08-24 LAB — CBC
HCT: 34.9 % — ABNORMAL LOW (ref 36.0–46.0)
Hemoglobin: 10.9 g/dL — ABNORMAL LOW (ref 12.0–15.0)
MCH: 28.5 pg (ref 26.0–34.0)
MCHC: 31.2 g/dL (ref 30.0–36.0)
MCV: 91.1 fL (ref 80.0–100.0)
Platelets: 105 10*3/uL — ABNORMAL LOW (ref 150–400)
RBC: 3.83 MIL/uL — ABNORMAL LOW (ref 3.87–5.11)
RDW: 15.8 % — ABNORMAL HIGH (ref 11.5–15.5)
WBC: 3.4 10*3/uL — ABNORMAL LOW (ref 4.0–10.5)
nRBC: 0 % (ref 0.0–0.2)

## 2020-08-24 LAB — GLUCOSE, CAPILLARY
Glucose-Capillary: 124 mg/dL — ABNORMAL HIGH (ref 70–99)
Glucose-Capillary: 56 mg/dL — ABNORMAL LOW (ref 70–99)
Glucose-Capillary: 86 mg/dL (ref 70–99)
Glucose-Capillary: 29 mg/dL — CL (ref 70–99)
Glucose-Capillary: <10 mg/dL — CL (ref 70–99)
Glucose-Capillary: 125 mg/dL — ABNORMAL HIGH (ref 70–99)
Glucose-Capillary: 27 mg/dL — CL (ref 70–99)
Glucose-Capillary: 353 mg/dL — ABNORMAL HIGH (ref 70–99)
Glucose-Capillary: 267 mg/dL — ABNORMAL HIGH (ref 70–99)

## 2020-08-24 LAB — MAGNESIUM: Magnesium: 1.8 mg/dL (ref 1.7–2.4)

## 2020-08-24 LAB — HEPARIN LEVEL (UNFRACTIONATED): Heparin Unfractionated: 0.99 IU/mL — ABNORMAL HIGH (ref 0.30–0.70)

## 2020-08-24 MED ORDER — DEXTROSE 50 % IV SOLN
25.0000 mL | INTRAVENOUS | Status: DC | PRN
  Administered 2020-08-24: 25 mL via INTRAVENOUS

## 2020-08-24 MED ORDER — ENOXAPARIN SODIUM 30 MG/0.3ML ~~LOC~~ SOLN
30.0000 mg | SUBCUTANEOUS | Status: DC
  Administered 2020-08-24 – 2020-08-25 (×2): 30 mg via SUBCUTANEOUS
  Filled 2020-08-24 (×2): qty 0.3

## 2020-08-24 MED ORDER — DEXTROSE 50 % IV SOLN
1.0000 | INTRAVENOUS | Status: DC | PRN
  Administered 2020-08-24 – 2020-09-03 (×5): 50 mL via INTRAVENOUS
  Filled 2020-08-24 (×5): qty 50

## 2020-08-24 MED ORDER — HEPARIN (PORCINE) 25000 UT/250ML-% IV SOLN
250.0000 [IU]/h | INTRAVENOUS | Status: DC
  Administered 2020-08-24: 250 [IU]/h via INTRAVENOUS

## 2020-08-24 MED ORDER — DEXTROSE IN LACTATED RINGERS 5 % IV SOLN: INTRAVENOUS | Status: DC

## 2020-08-24 MED ORDER — DEXTROSE 10 % IV SOLN: INTRAVENOUS | Status: DC

## 2020-08-24 MED ORDER — SENNOSIDES-DOCUSATE SODIUM 8.6-50 MG PO TABS: 2.0000 | ORAL_TABLET | Freq: Two times a day (BID) | ORAL | Status: DC | PRN

## 2020-08-24 NOTE — Progress Notes (Signed)
Erica Vasquez  GOT:[id number] DOB: July 28, 1930 DOA: 09/08/2020 PCP: Mirna Mires, MD    Brief Narrative:  86 year old with a history of HTN, glaucoma, and advanced dementia who was brought to the hospital by her niece with mental status changes.  Niece stated that she had simply not been eating or talking as per her usual.  She was found to be hypoglycemic at presentation.  Significant Events:  10/31 admit via ED  Antimicrobials:  None  DVT prophylaxis: IV heparin  Subjective: Patient is unresponsive at the time of my visit.  She does not appear to be uncomfortable.  I spoke with the patient's son and niece at the bedside at length.  Assessment & Plan:  Acute CVA -acute metabolic encephalopathy MRI 11/1 noted an acute CVA - Neurology evaluated the patient -continue rectal aspirin -not felt to be appropriate for any further interventions or studies  Advanced dementia No change in mental status  Hypoglycemia -hypothermia No evidence of an acute infectious process -cortisol and TSH unrevealing -dosed with steroids and empiric antibiotics given persisting symptoms without significant change -discontinue these interventions -add dextrose in IV fluid  Acute systolic CHF EF 20-30% via TTE with global hypokinesis -not a candidate for further invasive evaluation -careful with IV fluid -no present evidence of volume overload  Possible malignancy CT noted soft tissue mass lower chest wall and there is also concerned about a hyperdense area in the mid left kidney, as well as a left adnexal mass possibly consistent with a complex ovarian cyst -family reportedly aware and states patient did not desire further work-up previously  Acute PE Incidentally noted during stroke evaluation -on IV heparin  Hypokalemia Corrected with supplementation  Pancytopenia Stable at this time  HLD LDL 204 -unable to tolerate oral intake therefore treatment not possible  Pressure Injury 08/22/20  Coccyx Posterior;Mid Stage 3 -  Full thickness tissue loss. Subcutaneous fat may be visible but bone, tendon or muscle are NOT exposed. sloughing skin, black and yellow in color (Active)  08/22/20 1030  Location: Coccyx  Location Orientation: Posterior;Mid  Staging: Stage 3 -  Full thickness tissue loss. Subcutaneous fat may be visible but bone, tendon or muscle are NOT exposed.  Wound Description (Comments): sloughing skin, black and yellow in color  Present on Admission: Yes     Pressure Injury 08/22/20 Buttocks Left Stage 2 -  Partial thickness loss of dermis presenting as a shallow open injury with a red, pink wound bed without slough. skin tear- small (Active)  08/22/20 1030  Location: Buttocks  Location Orientation: Left  Staging: Stage 2 -  Partial thickness loss of dermis presenting as a shallow open injury with a red, pink wound bed without slough.  Wound Description (Comments): skin tear- small  Present on Admission: Yes     Code Status: NO CODE BLUE Family Communication: Spoke with son and niece at bedside Status is: Inpatient  Remains inpatient appropriate because:Altered mental status   Dispo: The patient is from: Home              Anticipated d/c is to: Home              Anticipated d/c date is: > 3 days              Patient currently is not medically stable to d/c.   Consultants:  Palliative care  Objective: Blood pressure (!) 161/71, pulse 73, temperature (!) 97.5 F (36.4 C), temperature source Oral, resp. rate (!) 9, height  5' (1.524 m), weight 38.6 kg, SpO2 96 %.  Intake/Output Summary (Last 24 hours) at 08/24/2020 1028 Last data filed at 08/23/2020 1800 Gross per 24 hour  Intake --  Output 251 ml  Net -251 ml   Filed Weights   08/22/20 2227 08/24/20 0500  Weight: 32.7 kg 38.6 kg    Examination: General: No acute respiratory distress Lungs: Clear to auscultation bilaterally  Cardiovascular: Regular rate and rhythm  Abdomen: Thin, soft, bowel  sounds hypoactive, no rebound, no mass Extremities: Cachectic, no significant edema  CBC: Recent Labs  Lab 08/18/2020 2035 08/11/2020 2035 08/03/2020 2130 08/22/20 0210 08/24/20 0602  WBC 3.4*  --   --  3.8* 3.4*  NEUTROABS 2.9  --   --  3.2  --   HGB 10.1*   < > 10.2* 11.2* 10.9*  HCT 32.8*   < > 30.0* 35.5* 34.9*  MCV 91.9  --   --  90.6 91.1  PLT 101*  --   --  122* 105*   < > = values in this interval not displayed.   Basic Metabolic Panel: Recent Labs  Lab 08/20/2020 2035 07/22/2020 2130 08/22/20 0210 08/22/20 1039 08/23/20 0349 08/23/20 1319 08/24/20 0602  NA 140   < > 145   < > 143 143 140  K 2.4*   < > 2.8*   < > 3.7 3.6 3.6  CL 103   < > 106   < > 106 104 105  CO2 26   < > 29   < > 26 27 26   GLUCOSE 773*   < > 220*   < > 67* 94 165*  BUN 33*   < > 31*   < > 21 22 24*  CREATININE 0.92   < > 0.82   < > 0.68 0.79 0.77  CALCIUM 8.7*   < > 8.7*   < > 8.7* 8.8* 8.6*  MG 2.1  --  2.2  --   --   --  1.8  PHOS  --   --   --   --   --   --  2.4*   < > = values in this interval not displayed.   GFR: Estimated Creatinine Clearance: 29.6 mL/min (by C-G formula based on SCr of 0.77 mg/dL).  Liver Function Tests: Recent Labs  Lab 08/01/2020 2035 08/22/20 0210 08/24/20 0602  AST 23 30 24   ALT 12 11 9   ALKPHOS 56 60 50  BILITOT 0.9 1.0 1.1  PROT 5.0* 5.5* 4.7*  ALBUMIN 2.9* 3.2* 2.6*   Recent Labs  Lab 07/27/2020 2035  LIPASE 28   Recent Labs  Lab 08/22/20 0210  AMMONIA 24    HbA1C: Hgb A1c MFr Bld  Date/Time Value Ref Range Status  08/23/2020 03:49 AM 5.2 4.8 - 5.6 % Final    Comment:    (NOTE) Pre diabetes:          5.7%-6.4%  Diabetes:              >6.4%  Glycemic control for   <7.0% adults with diabetes   08/22/2020 02:10 AM 5.2 4.8 - 5.6 % Final    Comment:    (NOTE) Pre diabetes:          5.7%-6.4%  Diabetes:              >6.4%  Glycemic control for   <7.0% adults with diabetes     CBG: Recent Labs  Lab 08/23/20 1257 08/23/20 1716  08/23/20 2112 08/24/20 0751 08/24/20 0825  GLUCAP 113* 75 123* 56* 124*    Recent Results (from the past 240 hour(s))  Culture, blood (routine x 2)     Status: None (Preliminary result)   Collection Time: 08/17/2020  8:35 PM   Specimen: BLOOD RIGHT HAND  Result Value Ref Range Status   Specimen Description   Final    BLOOD RIGHT HAND Performed at [facility], [address] [place], [place] [address]    Special Requests   Final    BOTTLES DRAWN AEROBIC ONLY Blood Culture adequate volume   Culture   Final    NO GROWTH 2 DAYS Performed at [facility], [address] [place], [place] [address]    Report Status PENDING  Incomplete  Culture, blood (routine x 2)     Status: None (Preliminary result)   Collection Time: 07/26/2020  8:36 PM   Specimen: BLOOD RIGHT FOREARM  Result Value Ref Range Status   Specimen Description   Final    BLOOD RIGHT FOREARM Performed at [facility], [address] [place], [place] [address]    Special Requests   Final    BOTTLES DRAWN AEROBIC AND ANAEROBIC Blood Culture adequate volume Performed at [facility], [address] [place], [place] [address]    Culture   Final    NO GROWTH 2 DAYS Performed at [facility], [address] [place], [place] [address]    Report Status PENDING  Incomplete  Respiratory Panel by RT PCR (Flu A&B, Covid) - Nasopharyngeal Swab     Status: None   Collection Time: 07/30/2020  9:12 PM   Specimen: Nasopharyngeal Swab  Result Value Ref Range Status   SARS Coronavirus 2 by RT PCR NEGATIVE NEGATIVE Final    Comment: (NOTE) SARS-CoV-2 target nucleic acids are NOT DETECTED.  The SARS-CoV-2 RNA is generally detectable in upper respiratoy specimens during the acute phase of infection. The lowest concentration of SARS-CoV-2 viral copies this assay can detect is 131 copies/mL. A negative result does not preclude SARS-Cov-2 infection and should not be  used as the sole basis for treatment or other patient management decisions. A negative result may occur with  improper specimen collection/handling, submission of specimen other than nasopharyngeal swab, presence of viral mutation(s) within the areas targeted by this assay, and inadequate number of viral copies (<131 copies/mL). A negative result must be combined with clinical observations, patient history, and epidemiological information. The expected result is Negative.  Fact Sheet for Patients:  https://www.moore.com/https://www.fda.gov/media/142436/download  Fact Sheet for Healthcare Providers:  https://www.young.biz/https://www.fda.gov/media/142435/download  This test is no t yet approved or cleared by the [place] FDA and  has been authorized for detection and/or diagnosis of SARS-CoV-2 by FDA under an Emergency Use Authorization (EUA). This EUA will remain  in effect (meaning this test can be used) for the duration of the COVID-19 declaration under Section 564(b)(1) of the Act, 21 U.S.C. section 360bbb-3(b)(1), unless the authorization is terminated or revoked sooner.     Influenza A by PCR NEGATIVE NEGATIVE Final   Influenza B by PCR NEGATIVE NEGATIVE Final    Comment: (NOTE) The Xpert Xpress SARS-CoV-2/FLU/RSV assay is intended as an aid in  the diagnosis of influenza from Nasopharyngeal swab specimens and  should not be used as a sole basis for treatment. Nasal washings and  aspirates are unacceptable for Xpert Xpress SARS-CoV-2/FLU/RSV  testing.  Fact Sheet for Patients: https://www.moore.com/https://www.fda.gov/media/142436/download  Fact  Sheet for Healthcare Providers: https://www.young.biz/  This test is not yet approved or cleared by the [place] and  has been authorized for detection and/or diagnosis of SARS-CoV-2 by  FDA under an Emergency Use Authorization (EUA). This EUA will remain  in effect (meaning this test can be used) for the duration of the  Covid-19 declaration under Section  564(b)(1) of the Act, 21  U.S.C. section 360bbb-3(b)(1), unless the authorization is  terminated or revoked. Performed at [facility], [address], [place], [place] [address]   Culture, Urine     Status: None   Collection Time: 07/30/2020 11:29 PM   Specimen: Urine, Clean Catch  Result Value Ref Range Status   Specimen Description   Final    URINE, CLEAN CATCH Performed at [facility], [address], [place], [place] [address]    Special Requests   Final    NONE Performed at [facility], [address], [place], [place] [address]    Culture   Final    NO GROWTH Performed at [facility], [address] [place], [place] [address]    Report Status 08/23/2020 FINAL  Final     Scheduled Meds: . aspirin  300 mg Rectal Daily  . dextrose  25 mL Intravenous Q2H  . feeding supplement  237 mL Oral BID BM  . hydrocortisone sod succinate (SOLU-CORTEF) inj  50 mg Intravenous Q8H  . lactulose  20 g Oral BID  . multivitamin with minerals  1 tablet Oral Daily  . pantoprazole  40 mg Oral Daily  . senna-docusate  2 tablet Oral BID   Continuous Infusions: . sodium chloride Stopped (08/23/20 0615)  . ceFEPime (MAXIPIME) IV 2 g (08/23/20 2107)  . heparin 250 Units/hr (08/24/20 3875)  . vancomycin 500 mg (08/24/20 0006)     LOS: 2 days   Lonia Blood, MD [facility] Office  [phone] Pager - Text Page per Amion  If 7PM-7AM, please contact night-coverage per Amion 08/24/2020, 10:28 AM

## 2020-08-24 NOTE — Progress Notes (Signed)
OT Cancellation Note  Patient Details Name: Erica Vasquez MRN: [id number] DOB: [birth year]   Cancelled Treatment:    Reason Eval/Treat Not Completed: Medical issues which prohibited therapy. Per chart review patient with significant medical decline, pt unresponsive. Will discontinue acute OT services at this time.  Marlyce Huge OT OT pager: [phone]   Carmelia Roller 08/24/2020, 6:32 AM

## 2020-08-24 NOTE — Progress Notes (Signed)
SLP Cancellation Note  Patient Details Name: Erica Vasquez MRN: [id number] DOB: 1930-03-28   Cancelled treatment:       Reason Eval/Treat Not Completed: Patient's level of consciousness (per OT note, pt unresponsive, will continue efforts)  Note palliative documentation of visit.    Rolena Infante, MS [facility] SLP Acute Rehab Services Office [phone] Pager [phone]   Chales Abrahams 08/24/2020, 12:48 PM

## 2020-08-24 NOTE — Progress Notes (Signed)
ANTICOAGULATION CONSULT NOTE - Follow Up Consult  Pharmacy Consult for heparin drip Indication: acute pulmonary embolus and stroke  Allergies  Allergen Reactions   Bimatoprost Itching   Brimonidine Itching   Timolol Itching    Patient Measurements: Height: 5' (152.4 cm) Weight: 38.6 kg (85 lb 1.6 oz) IBW/kg (Calculated) : 45.5 Heparin Dosing Weight: n/a. Use TBW = 38 kg  Vital Signs: Temp: 97.5 F (36.4 C) (11/03 0627) Temp Source: Oral (11/03 0627) BP: 161/71 (11/03 0627) Pulse Rate: 73 (11/03 0627)  Labs: Recent Labs    2020/09/14 2035 09-14-2020 2035 09-14-20 2130 2020-09-14 2130 08/22/20 0210 08/22/20 0210 08/22/20 1849 08/23/20 0349 08/23/20 1319 08/24/20 0602  HGB 10.1*   < > 10.2*   < > 11.2*  --   --   --   --  10.9*  HCT 32.8*   < > 30.0*  --  35.5*  --   --   --   --  34.9*  PLT 101*  --   --   --  122*  --   --   --   --  105*  HEPARINUNFRC  --   --   --   --   --   --   --   --   --  0.99*  CREATININE 0.92   < > 0.90   < > 0.82   < > 0.64 0.68 0.79  --    < > = values in this interval not displayed.    Estimated Creatinine Clearance: 29.6 mL/min (by C-G formula based on SCr of 0.79 mg/dL).  Assessment: Patient's an 86 y.o F with hx advanced dementia presented to the ED on 11/1 with poor oral intake and AMS. Head/neck CT showed acute PE and brain MRI showed two "subcentimeter acute to subacute ischemic infarctions." Neurologist recom. starting patient on anticoag. as benefits outweigh the risks. She was started on full LMWH with 30 mg dose given at 0100 on 11/2.  Heparin drip is generally preferred for acute stroke - discussed with MD on on 11/2 and transitioned to heparin drip.  Baseline labs: -Hgb 10.2 -Plt 101  Today, 08/24/2020:  HL = 0.99 is supratherapeutic on heparin infusion of 400 units/hr  CBC: Hgb (10.9) & Plt (105) low but stable  Confirmed with RN that heparin infusing at correct rate. No overt signs of bleeding.  Goal of Therapy:   Heparin level 0.3-0.5 units/ml Monitor platelets by anticoagulation protocol: Yes   Plan:   Hold heparin infusion for 1 hour  Decrease rate of heparin infusion to 250 units/hr  Check HL in 8 hours  HL, CBC daily while on heparin drip  Monitor for signs of bleeding  Cindi Carbon, PharmD 08/24/2020,6:43 AM

## 2020-08-24 NOTE — Progress Notes (Signed)
Pt temp taken but does not register. Pt under warming blanket at this time

## 2020-08-24 NOTE — Progress Notes (Signed)
ANTICOAGULATION CONSULT NOTE - Follow Up Consult  Pharmacy Consult for LMWH --> heparin drip--> LMWH Indication: acute pulmonary embolus and stroke  Allergies  Allergen Reactions  . Bimatoprost Itching  . Brimonidine Itching  . Timolol Itching    Patient Measurements: Height: 5' (152.4 cm) Weight: 38.6 kg (85 lb 1.6 oz) IBW/kg (Calculated) : 45.5 Heparin Dosing Weight: 32 kg  Vital Signs: Temp: 97.6 F (36.4 C) (11/03 1500) BP: 167/57 (11/03 1832) Pulse Rate: 59 (11/03 1832)  Labs: Recent Labs    2020/08/27 2035 August 27, 2020 2035 08/27/2020 2130 27-Aug-2020 2130 08/22/20 0210 08/22/20 1849 08/23/20 0349 08/23/20 1319 08/24/20 0602  HGB 10.1*   < > 10.2*   < > 11.2*  --   --   --  10.9*  HCT 32.8*   < > 30.0*  --  35.5*  --   --   --  34.9*  PLT 101*  --   --   --  122*  --   --   --  105*  HEPARINUNFRC  --   --   --   --   --   --   --   --  0.99*  CREATININE 0.92   < > 0.90   < > 0.82   < > 0.68 0.79 0.77   < > = values in this interval not displayed.    Estimated Creatinine Clearance: 29.6 mL/min (by C-G formula based on SCr of 0.77 mg/dL).  Assessment: Patient's an 86 y.o F with hx dementia presented to the ED on 11/1 with poor oral intake and AMS. Head/neck CT showed acute PE and brain MRI showed two "subcentimeter acute to subacute ischemic infarctions." Neurologist recom. starting patient on anticoag. as benefits outweigh the risks. She was started on full LMWH with 30 mg dose given at 0100 on 11/2.  Heparin drip is generally preferred for acute stroke -- discussed with MD on on 11/2 and transitioned to heparin drip. Patient is now a hard stick and phlebotomist is not able to collect blood samples for heparin level. Per Dr. Sharon Seller on 11/3, change heparin to lovenox.  Today, 08/24/2020: - scr 0.77 (crcl~23, rounded scr to 1 for adv age) - hgb 10.9, plts 105  Goal of Therapy:  Heparin level 0.3-0.5 units/ml Monitor platelets by anticoagulation protocol: Yes   Plan:   - d/c heparin drip - start lovenox 30 mg SQ q24h - monitor for s/sx bleeding  Irene Mitcham P 08/24/2020,6:57 PM

## 2020-08-25 DIAGNOSIS — I639 Cerebral infarction, unspecified: Secondary | ICD-10-CM | POA: Diagnosis not present

## 2020-08-25 DIAGNOSIS — G309 Alzheimer's disease, unspecified: Secondary | ICD-10-CM | POA: Diagnosis not present

## 2020-08-25 DIAGNOSIS — Z7189 Other specified counseling: Secondary | ICD-10-CM | POA: Diagnosis not present

## 2020-08-25 DIAGNOSIS — E876 Hypokalemia: Secondary | ICD-10-CM | POA: Diagnosis not present

## 2020-08-25 DIAGNOSIS — E162 Hypoglycemia, unspecified: Secondary | ICD-10-CM | POA: Diagnosis not present

## 2020-08-25 LAB — CBC
HCT: 38.5 % (ref 36.0–46.0)
Hemoglobin: 11.7 g/dL — ABNORMAL LOW (ref 12.0–15.0)
MCH: 28.4 pg (ref 26.0–34.0)
MCHC: 30.4 g/dL (ref 30.0–36.0)
MCV: 93.4 fL (ref 80.0–100.0)
Platelets: 109 10*3/uL — ABNORMAL LOW (ref 150–400)
RBC: 4.12 MIL/uL (ref 3.87–5.11)
RDW: 15.5 % (ref 11.5–15.5)
WBC: 4 10*3/uL (ref 4.0–10.5)
nRBC: 0 % (ref 0.0–0.2)

## 2020-08-25 MED ORDER — LIP MEDEX EX OINT
TOPICAL_OINTMENT | CUTANEOUS | Status: DC | PRN
  Filled 2020-08-25: qty 7

## 2020-08-25 NOTE — Progress Notes (Addendum)
Erica Vasquez Erica Vasquez P Crysler  ZOX:[id number] DOB: 1930/10/21 DOA: 07/22/2020 PCP: Mirna Vasquez, [name], MD    Brief Narrative:  86 year old with a history of HTN, glaucoma, and advanced dementia who was brought to the hospital by her niece with mental status changes.  Niece stated that she had simply not been eating or talking as per her usual.  She was found to be hypoglycemic at presentation.  Significant Events:  10/31 admit via ED  Antimicrobials:  None  DVT prophylaxis: Full dose Lovenox  Subjective: Remains unresponsive.  Hypoglycemia and hypothermia persist.  It appears the patient is actively dying.  We are providing all the support that is appropriate for her situation.  She does not appear to be uncomfortable.  Assessment & Plan:  Acute CVA -acute metabolic encephalopathy MRI 11/1 noted an acute CVA - Neurology evaluated the patient - not felt to be appropriate for any further interventions or studies  Advanced dementia No change in mental status  Hypoglycemia -hypothermia No evidence of an acute infectious process -cortisol and TSH unrevealing -dosed with steroids and empiric antibiotics given persisting symptoms without significant change - added dextrose in IV fluid -her CBG readings are likely inaccurate due to poor peripheral perfusion  Acute systolic CHF EF 20-30% via TTE with global hypokinesis -not a candidate for further invasive evaluation -careful with IV fluid -no present evidence of volume overload  Possible malignancy CT noted soft tissue mass lower chest wall and there is also concerned about a hyperdense area in the mid left kidney, as well as a left adnexal mass possibly consistent with a complex ovarian cyst -family reportedly aware and states patient did not desire further work-up previously  Acute PE Incidentally noted during stroke evaluation -Heparin changed to Lovenox due to difficulty obtaining blood for heparin levels  Hypokalemia Corrected with  supplementation  Pancytopenia Stable at this time  Underweight - Body mass index is 16.62 kg/m.  HLD LDL 204 -unable to tolerate oral intake therefore treatment not possible  Pressure Injury 08/22/20 Coccyx Posterior;Mid Stage 3 -  Full thickness tissue loss. Subcutaneous fat may be visible but bone, tendon or muscle are NOT exposed. sloughing skin, black and yellow in color (Active)  08/22/20 1030  Location: Coccyx  Location Orientation: Posterior;Mid  Staging: Stage 3 -  Full thickness tissue loss. Subcutaneous fat may be visible but bone, tendon or muscle are NOT exposed.  Wound Description (Comments): sloughing skin, black and yellow in color  Present on Admission: Yes     Pressure Injury 08/22/20 Buttocks Left Stage 2 -  Partial thickness loss of dermis presenting as a shallow open injury with a red, pink wound bed without slough. skin tear- small (Active)  08/22/20 1030  Location: Buttocks  Location Orientation: Left  Staging: Stage 2 -  Partial thickness loss of dermis presenting as a shallow open injury with a red, pink wound bed without slough.  Wound Description (Comments): skin tear- small  Present on Admission: Yes     Code Status: NO CODE BLUE Family Communication:  Status is: Inpatient  Remains inpatient appropriate because:Altered mental status   Dispo: The patient is from: Home              Anticipated d/c is to: Home              Anticipated d/c date is: > 3 days              Patient currently is not medically stable to d/c.   Consultants:  Palliative care  Objective: Blood pressure (!) 150/85, pulse 67, temperature (!) 97.5 F (36.4 C), resp. rate 10, height 5' (1.524 m), weight 38.6 kg, SpO2 100 %. No intake or output data in the 24 hours ending 08/25/20 1016 Filed Weights   08/22/20 2227 08/24/20 0500  Weight: 32.7 kg 38.6 kg    Examination: General: No acute respiratory distress Lungs: Clear to auscultation bilaterally  Cardiovascular:  Regular rate and rhythm  Abdomen: Thin, soft, bowel sounds hypoactive, no rebound, no mass Extremities: Cachectic, no significant edema  CBC: Recent Labs  Lab Aug 28, 2020 2035 Aug 28, 2020 2130 08/22/20 0210 08/24/20 0602 08/25/20 0352  WBC 3.4*   < > 3.8* 3.4* 4.0  NEUTROABS 2.9  --  3.2  --   --   HGB 10.1*   < > 11.2* 10.9* 11.7*  HCT 32.8*   < > 35.5* 34.9* 38.5  MCV 91.9   < > 90.6 91.1 93.4  PLT 101*   < > 122* 105* 109*   < > = values in this interval not displayed.   Basic Metabolic Panel: Recent Labs  Lab Aug 28, 2020 2035 Aug 28, 2020 2130 08/22/20 0210 08/22/20 1039 08/23/20 0349 08/23/20 1319 08/24/20 0602  NA 140   < > 145   < > 143 143 140  K 2.4*   < > 2.8*   < > 3.7 3.6 3.6  CL 103   < > 106   < > 106 104 105  CO2 26   < > 29   < > 26 27 26   GLUCOSE 773*   < > 220*   < > 67* 94 165*  BUN 33*   < > 31*   < > 21 22 24*  CREATININE 0.92   < > 0.82   < > 0.68 0.79 0.77  CALCIUM 8.7*   < > 8.7*   < > 8.7* 8.8* 8.6*  MG 2.1  --  2.2  --   --   --  1.8  PHOS  --   --   --   --   --   --  2.4*   < > = values in this interval not displayed.   GFR: Estimated Creatinine Clearance: 29.6 mL/min (by C-G formula based on SCr of 0.77 mg/dL).  Liver Function Tests: Recent Labs  Lab 08/28/20 2035 08/22/20 0210 08/24/20 0602  AST 23 30 24   ALT 12 11 9   ALKPHOS 56 60 50  BILITOT 0.9 1.0 1.1  PROT 5.0* 5.5* 4.7*  ALBUMIN 2.9* 3.2* 2.6*   Recent Labs  Lab 08-28-20 2035  LIPASE 28   Recent Labs  Lab 08/22/20 0210  AMMONIA 24    HbA1C: Hgb A1c MFr Bld  Date/Time Value Ref Range Status  08/23/2020 03:49 AM 5.2 4.8 - 5.6 % Final    Comment:    (NOTE) Pre diabetes:          5.7%-6.4%  Diabetes:              >6.4%  Glycemic control for   <7.0% adults with diabetes   08/22/2020 02:10 AM 5.2 4.8 - 5.6 % Final    Comment:    (NOTE) Pre diabetes:          5.7%-6.4%  Diabetes:              >6.4%  Glycemic control for   <7.0% adults with diabetes      CBG: Recent Labs  Lab 08/24/20 1823 08/24/20 1826 08/24/20 1828  08/24/20 1955 08/24/20 2106  GLUCAP 31* 38* 27* 353* 267*    Recent Results (from the past 240 hour(s))  Culture, blood (routine x 2)     Status: None (Preliminary result)   Collection Time: 08/18/2020  8:35 PM   Specimen: BLOOD RIGHT HAND  Result Value Ref Range Status   Specimen Description   Final    BLOOD RIGHT HAND Performed at [facility], [address], [place], [place] [address]    Special Requests   Final    BOTTLES DRAWN AEROBIC ONLY Blood Culture adequate volume   Culture   Final    NO GROWTH 3 DAYS Performed at [facility], [address], [place], [place] [address]    Report Status PENDING  Incomplete  Culture, blood (routine x 2)     Status: None (Preliminary result)   Collection Time: 08/14/2020  8:36 PM   Specimen: BLOOD RIGHT FOREARM  Result Value Ref Range Status   Specimen Description   Final    BLOOD RIGHT FOREARM Performed at [facility], [address] [place], [place] [address]    Special Requests   Final    BOTTLES DRAWN AEROBIC AND ANAEROBIC Blood Culture adequate volume Performed at [facility], [address] [place], [place] [address]    Culture   Final    NO GROWTH 3 DAYS Performed at [facility], [address] [place], [place] [address]    Report Status PENDING  Incomplete  Respiratory Panel by RT PCR (Flu A&B, Covid) - Nasopharyngeal Swab     Status: None   Collection Time: 08/01/2020  9:12 PM   Specimen: Nasopharyngeal Swab  Result Value Ref Range Status   SARS Coronavirus 2 by RT PCR NEGATIVE NEGATIVE Final    Comment: (NOTE) SARS-CoV-2 target nucleic acids are NOT DETECTED.  The SARS-CoV-2 RNA is generally detectable in upper respiratoy specimens during the acute phase of infection. The lowest concentration of SARS-CoV-2 viral copies this assay can detect is 131 copies/mL. A negative  result does not preclude SARS-Cov-2 infection and should not be used as the sole basis for treatment or other patient management decisions. A negative result may occur with  improper specimen collection/handling, submission of specimen other than nasopharyngeal swab, presence of viral mutation(s) within the areas targeted by this assay, and inadequate number of viral copies (<131 copies/mL). A negative result must be combined with clinical observations, patient history, and epidemiological information. The expected result is Negative.  Fact Sheet for Patients:  https://www.moore.com/  Fact Sheet for Healthcare Providers:  https://www.young.biz/  This test is no t yet approved or cleared by the [place] FDA and  has been authorized for detection and/or diagnosis of SARS-CoV-2 by FDA under an Emergency Use Authorization (EUA). This EUA will remain  in effect (meaning this test can be used) for the duration of the COVID-19 declaration under Section 564(b)(1) of the Act, 21 U.S.C. section 360bbb-3(b)(1), unless the authorization is terminated or revoked sooner.     Influenza A by PCR NEGATIVE NEGATIVE Final   Influenza B by PCR NEGATIVE NEGATIVE Final    Comment: (NOTE) The Xpert Xpress SARS-CoV-2/FLU/RSV assay is intended as an aid in  the diagnosis of influenza from Nasopharyngeal swab specimens and  should not be used as a sole basis for treatment. Nasal washings and  aspirates are unacceptable for Xpert Xpress SARS-CoV-2/FLU/RSV  testing.  Fact Sheet for Patients: https://www.moore.com/  Fact Sheet for  Healthcare Providers: https://www.young.biz/  This test is not yet approved or cleared by the [place] and  has been authorized for detection and/or diagnosis of SARS-CoV-2 by  FDA under an Emergency Use Authorization (EUA). This EUA will remain  in effect (meaning this test can be used)  for the duration of the  Covid-19 declaration under Section 564(b)(1) of the Act, 21  U.S.C. section 360bbb-3(b)(1), unless the authorization is  terminated or revoked. Performed at [facility], [address] [place], [place] [address]   Culture, Urine     Status: None   Collection Time: 08/19/2020 11:29 PM   Specimen: Urine, Clean Catch  Result Value Ref Range Status   Specimen Description   Final    URINE, CLEAN CATCH Performed at [facility], [address], [place], [place] [address]    Special Requests   Final    NONE Performed at [facility], [address] [place], [place] [address]    Culture   Final    NO GROWTH Performed at [facility], [address] [place], [place] [address]    Report Status 08/23/2020 FINAL  Final     Scheduled Meds: . aspirin  300 mg Rectal Daily  . enoxaparin (LOVENOX) injection  30 mg Subcutaneous Q24H  . feeding supplement  237 mL Oral BID BM   Continuous Infusions: . dextrose 50 mL/hr at 08/24/20 1722     LOS: 3 days   Lonia Blood, MD [facility] Office  [phone] Pager - Text Page per Loretha Stapler  If 7PM-7AM, please contact night-coverage per Amion 08/25/2020, 10:16 AM

## 2020-08-25 NOTE — Progress Notes (Signed)
Daily Progress Note   Patient Name: Erica Vasquez       Date: 08/25/2020 DOB: 18-May-1930  Age: 86 y.o. MRN#: [id number] Attending Physician: Lonia Blood, MD Primary Care Physician: Mirna Mires, MD Admit Date: 08/09/2020  Reason for Consultation/Follow-up: Establishing goals of care  Subjective: Patient seen lying in bed.  Today she does not respond as much as she did yesterday.  Her tongue is protruding from her mouth and is dry.  Noted significant episodes of hypoglycemia last night.  Provided oral care.  I called Erica Vasquez as there was no family at bedside.  Erica Vasquez expressed that he is wrapping his head around the situation but feels that he is not in a very good place.  Erica Vasquez sounded very depressed.  Emotional support was given.  I encouraged Erica Vasquez to seek resources for grief counseling.  I did not broach goals of care or end-of-life issues with Erica Vasquez today.  Review of Systems  Unable to perform ROS: Acuity of condition    Length of Stay: 3  Current Medications: Scheduled Meds:  . aspirin  300 mg Rectal Daily  . enoxaparin (LOVENOX) injection  30 mg Subcutaneous Q24H  . feeding supplement  237 mL Oral BID BM    Continuous Infusions: . dextrose 50 mL/hr at 08/24/20 1722    PRN Meds: acetaminophen **OR** acetaminophen, dextrose, methocarbamol, ondansetron **OR** ondansetron (ZOFRAN) IV, senna-docusate  Physical Exam Vitals and nursing note reviewed.  Constitutional:      Appearance: She is ill-appearing.     Comments: Frail, cachetic  HENT:     Mouth/Throat:     Mouth: Mucous membranes are dry.     Comments: Dried secretions on lips, tongue protruding Neurological:     Comments: Unresponsive             Vital Signs: BP (!) 140/58 (BP Location: Left Arm)    Pulse (!) 58   Temp (!) 97.5 F (36.4 C)   Resp 16   Ht 5' (1.524 m)   Wt 38.6 kg   SpO2 100%   BMI 16.62 kg/m  SpO2: SpO2: 100 % O2 Device: O2 Device: Nasal Cannula O2 Flow Rate: O2 Flow Rate (L/min): 2.5 L/min  Intake/output summary:   Intake/Output Summary (Last 24 hours) at 08/25/2020 1351 Last data filed at 08/25/2020 1245 Gross per  24 hour  Intake 0 ml  Output --  Net 0 ml   LBM: Last BM Date: 08/24/20 Baseline Weight: Weight: 32.7 kg Most recent weight: Weight: 38.6 kg       Palliative Assessment/Data: PPS: 10%      Patient Active Problem List   Diagnosis Date Noted  . Altered mental status   . Palliative care by specialist   . Acute metabolic encephalopathy 08/22/2020  . CVA (cerebral vascular accident) (HCC) 08/22/2020  . Pressure injury of skin 08/22/2020  . Advanced care planning/counseling discussion   . Hypoglycemia   . Dementia without behavioral disturbance (HCC)   . Goals of care, counseling/discussion   . Postoperative anemia due to acute blood loss 11/27/2018  . Hip fracture (HCC) 11/23/2018  . Chronic pain of right knee 03/11/2018  . Pain in right hip 02/10/2018  . Anemia 02/04/2018  . Constipation due to opioid therapy 01/31/2018  . Alzheimer disease (HCC) 01/31/2018  . Malnutrition of moderate degree 01/27/2018  . Fall 01/25/2018  . Blindness 01/25/2018  . Hypokalemia 01/25/2018  . Lytic bone lesion of hip 01/25/2018  . Essential hypertension, benign     Palliative Care Assessment & Plan   Patient Profile: 86 y.o.femalewith past medical history of blindness,advanceddementia, chest mass that has never been diagnosed,admitted on10/31/2021with altered mental status,hypoglycemia,pancytopenia,hypokalemia, bradycardia.Work-up revealed acute CVA, andpulmonary embolism.She is mostly nonresponsive. Palliative consulted for goals of care.   Assessment/Recommendations/Plan  PMT will continue to follow and provide  support Recommend frequent oral care for comfort and Vaseline to lips for comfort Patient appears to be at end-of-life, with frequent episodes of glycemia, she is not eating she is unresponsive-her son is having a great amount of difficulty facing his mother's mortality  Code Status: DNR  Prognosis:  < 2 weeks  Discharge Planning: To Be Determined  Care plan was discussed with patient's son  Thank you for allowing the Palliative Medicine Team to assist in the care of this patient.   Total time: 37 minutes Greater than 50%  of this time was spent counseling and coordinating care related to the above assessment and plan.  Ocie Bob, AGNP-C Palliative Medicine   Please contact Palliative Medicine Team phone at [phone] for questions and concerns.

## 2020-08-25 NOTE — Progress Notes (Signed)
Nutrition Note  Patient identified on the Malnutrition Screening Tool (MST) Report  Pt not eating and nonresponsive per Palliative care note. Pt with poor prognosis. If status improves, can consult RD for assessment. At this time no interventions warranted.  Wt Readings from Last 15 Encounters:  08/24/20 38.6 kg  01/23/19 45.4 kg  12/09/18 45.4 kg  02/25/18 45.4 kg  01/31/18 42.6 kg  01/25/18 42.6 kg    Body mass index is 16.62 kg/m. Patient meets criteria for underweight based on current BMI.   Current diet order is NPO given unresponsiveness. Labs and medications reviewed.   Tilda Franco, MS, RD, LDN Inpatient Clinical Dietitian Contact information available via Amion

## 2020-08-25 NOTE — Progress Notes (Signed)
SLP Cancellation Note  Patient Details Name: Erica Vasquez MRN: [id number] DOB: 01-03-1930   Cancelled treatment:         Per palliative note, pt unresponsive today- not appropriate for po.  Discussions with family regarding goals of care appear ongoing per documentation.    Rolena Infante, MS [facility] SLP Acute Rehab Services Office [phone] Pager [phone]   Chales Abrahams 08/25/2020, 4:21 PM

## 2020-08-26 DIAGNOSIS — E876 Hypokalemia: Secondary | ICD-10-CM | POA: Diagnosis not present

## 2020-08-26 DIAGNOSIS — E162 Hypoglycemia, unspecified: Secondary | ICD-10-CM | POA: Diagnosis not present

## 2020-08-26 LAB — GLUCOSE, CAPILLARY
Glucose-Capillary: 31 mg/dL — CL (ref 70–99)
Glucose-Capillary: 38 mg/dL — CL (ref 70–99)
Glucose-Capillary: 132 mg/dL — ABNORMAL HIGH (ref 70–99)
Glucose-Capillary: 131 mg/dL — ABNORMAL HIGH (ref 70–99)

## 2020-08-26 LAB — CULTURE, BLOOD (ROUTINE X 2)

## 2020-08-26 NOTE — Progress Notes (Addendum)
   08/26/20 1434  Assess: MEWS Score  Temp (!) 100.5 F (38.1 C)  BP 111/77  Pulse Rate (!) 134  Resp 14  SpO2 100 %  O2 Device Nasal Cannula  O2 Flow Rate (L/min) 4 L/min  Assess: MEWS Score  MEWS Temp 1  MEWS Systolic 0  MEWS Pulse 3  MEWS RR 0  MEWS LOC 2  MEWS Score 6  MEWS Score Color Red  Assess: if the MEWS score is Yellow or Red  Were vital signs taken at a resting state? Yes  Focused Assessment No change from prior assessment  Early Detection of Sepsis Score *See Row Information* Medium  MEWS guidelines implemented *See Row Information* No, previously yellow, continue vital signs every 4 hours  Treat  Pain Scale PAINAD  Pain Score 0  Take Vital Signs  Increase Vital Sign Frequency  Red: Q 1hr X 4 then Q 4hr X 4, if remains red, continue Q 4hrs  Escalate  MEWS: Escalate Red: discuss with charge nurse/RN and provider, consider discussing with RRT  Notify: Charge Nurse/RN  Name of Charge Nurse/RN Notified Chasity/Breanna  Date Charge Nurse/RN Notified 08/26/20  Time Charge Nurse/RN Notified 1435  Notify: Provider  Provider Name/Title McClung  Date Provider Notified 08/26/20  Time Provider Notified 1440  Notification Type Face-to-face  Notification Reason Other (Comment) (Red MEWS)  Response No new orders  Notify: Rapid Response  Name of Rapid Response RN Notified Hazel Sams RN  Date Rapid Response Notified 08/26/20  Time Rapid Response Notified 1450  Document  Patient Outcome Not stable and remains on department   Patient still remains unresponsivel. Unable to open her eyes, but holds hand with his son at bedside. Dr. Sharon Seller was notified face to face, had previously spoken to family at bedside few minutes ago. Family  (niece) at bedside. Charge Nurse was notified. Called and notified rapid response as per protocol. No further order, RED MEWS vital sign guideline in place. Will continue to closely monitor patient.

## 2020-08-26 NOTE — Plan of Care (Signed)
  Problem: Education: Goal: Knowledge of General Education information will improve Description: Including pain rating scale, medication(s)/side effects and non-pharmacologic comfort measures Outcome: Not Progressing   

## 2020-08-26 NOTE — Progress Notes (Signed)
SLP Cancellation Note  Patient Details Name: Erica Vasquez MRN: [id number] DOB: 10-07-1930   Cancelled treatment:       Reason Eval/Treat Not Completed: Other (comment) (SLP to sign off, pt remains non responsive)  Rolena Infante, MS [facility] SLP Acute Rehab Services Office [phone] Pager [phone]    Chales Abrahams 08/26/2020, 2:34 PM

## 2020-08-26 NOTE — Progress Notes (Signed)
Erica Vasquez  WFU:[id number] DOB: 1930-03-24 DOA: 07/30/2020 PCP: Mirna Mires, MD    Brief Narrative:  86 year old with a history of HTN, glaucoma, and advanced dementia who was brought to the hospital by her niece with mental status changes.  Niece stated that she had simply not been eating or talking as per her usual.  She was found to be hypoglycemic at presentation.  Significant Events:  10/31 admit via ED  Antimicrobials:  None  DVT prophylaxis: Full dose Lovenox  Subjective: Vital signs are presently stable.  CBG remains quite variable. Pt remains obtunded.   Assessment & Plan:  Acute CVA -acute metabolic encephalopathy MRI 11/1 noted an acute CVA - Neurology evaluated the patient - not felt to be appropriate for any further interventions or studies  Advanced dementia No change in mental status -unresponsive at this time  Hypoglycemia -hypothermia No evidence of an acute infectious process -cortisol and TSH unrevealing -dosed with steroids and empiric antibiotics given persisting symptoms without significant improvement- added dextrose in IV fluid -her CBG readings are likely inaccurate due to poor peripheral perfusion  Acute systolic CHF EF 20-30% via TTE with global hypokinesis -not a candidate for further invasive evaluation - slow IV fluid -no present evidence of volume overload  Possible malignancy CT noted soft tissue mass lower chest wall and there is also concern about a hyperdense area in the mid left kidney, as well as a left adnexal mass possibly consistent with a complex ovarian cyst - family reportedly aware and states patient did not desire further work-up previously  Acute PE Incidentally noted during stroke evaluation - Heparin changed to Lovenox due to difficulty obtaining blood for heparin levels - at this point the pt is declining and I do not feel ongoing dosing of lovenox is providing her any significant benefit or will change her outcome  therefore I am stopping it - I discussed this w/ her niece at the bedside   Hypokalemia Corrected with supplementation  Pancytopenia Stable at this time  Underweight - Body mass index is 16.62 kg/m.  HLD LDL 204 -unable to tolerate oral intake therefore treatment not possible  Pressure Injury 08/22/20 Coccyx Posterior;Mid Stage 3 -  Full thickness tissue loss. Subcutaneous fat may be visible but bone, tendon or muscle are NOT exposed. sloughing skin, black and yellow in color (Active)  08/22/20 1030  Location: Coccyx  Location Orientation: Posterior;Mid  Staging: Stage 3 -  Full thickness tissue loss. Subcutaneous fat may be visible but bone, tendon or muscle are NOT exposed.  Wound Description (Comments): sloughing skin, black and yellow in color  Present on Admission: Yes     Pressure Injury 08/22/20 Buttocks Left Stage 2 -  Partial thickness loss of dermis presenting as a shallow open injury with a red, pink wound bed without slough. skin tear- small (Active)  08/22/20 1030  Location: Buttocks  Location Orientation: Left  Staging: Stage 2 -  Partial thickness loss of dermis presenting as a shallow open injury with a red, pink wound bed without slough.  Wound Description (Comments): skin tear- small  Present on Admission: Yes     Code Status: NO CODE BLUE Family Communication: spoke w/ niece at bedside  Status is: Inpatient  Remains inpatient appropriate because:Altered mental status   Dispo: The patient is from: Home              Anticipated d/c is to: Home  Anticipated d/c date is: > 3 days              Patient currently is not medically stable to d/c.   Consultants:  Palliative care  Objective: Blood pressure 124/75, pulse 97, temperature 98.2 F (36.8 C), temperature source Axillary, resp. rate 20, height 5' (1.524 m), weight 38.6 kg, SpO2 97 %.  Intake/Output Summary (Last 24 hours) at 08/26/2020 0855 Last data filed at 08/25/2020 1436 Gross per  24 hour  Intake 0 ml  Output 50 ml  Net -50 ml   Filed Weights   08/22/20 2227 08/24/20 0500  Weight: 32.7 kg 38.6 kg    Examination: General: No acute respiratory distress Lungs: Clear to auscultation bilaterally - shallow respirations  Cardiovascular: RRR Abdomen: Thin, soft, bowel sounds hypoactive Extremities: Cachectic, no significant edema  CBC: Recent Labs  Lab 09-06-20 2035 09-06-2020 2130 08/22/20 0210 08/24/20 0602 08/25/20 0352  WBC 3.4*   < > 3.8* 3.4* 4.0  NEUTROABS 2.9  --  3.2  --   --   HGB 10.1*   < > 11.2* 10.9* 11.7*  HCT 32.8*   < > 35.5* 34.9* 38.5  MCV 91.9   < > 90.6 91.1 93.4  PLT 101*   < > 122* 105* 109*   < > = values in this interval not displayed.   Basic Metabolic Panel: Recent Labs  Lab 09/06/20 2035 2020-09-06 2130 08/22/20 0210 08/22/20 1039 08/23/20 0349 08/23/20 1319 08/24/20 0602  NA 140   < > 145   < > 143 143 140  K 2.4*   < > 2.8*   < > 3.7 3.6 3.6  CL 103   < > 106   < > 106 104 105  CO2 26   < > 29   < > 26 27 26   GLUCOSE 773*   < > 220*   < > 67* 94 165*  BUN 33*   < > 31*   < > 21 22 24*  CREATININE 0.92   < > 0.82   < > 0.68 0.79 0.77  CALCIUM 8.7*   < > 8.7*   < > 8.7* 8.8* 8.6*  MG 2.1  --  2.2  --   --   --  1.8  PHOS  --   --   --   --   --   --  2.4*   < > = values in this interval not displayed.   GFR: Estimated Creatinine Clearance: 29.6 mL/min (by C-G formula based on SCr of 0.77 mg/dL).  Liver Function Tests: Recent Labs  Lab 09/06/2020 2035 08/22/20 0210 08/24/20 0602  AST 23 30 24   ALT 12 11 9   ALKPHOS 56 60 50  BILITOT 0.9 1.0 1.1  PROT 5.0* 5.5* 4.7*  ALBUMIN 2.9* 3.2* 2.6*   Recent Labs  Lab 06-Sep-2020 2035  LIPASE 28   Recent Labs  Lab 08/22/20 0210  AMMONIA 24    HbA1C: Hgb A1c MFr Bld  Date/Time Value Ref Range Status  08/23/2020 03:49 AM 5.2 4.8 - 5.6 % Final    Comment:    (NOTE) Pre diabetes:          5.7%-6.4%  Diabetes:              >6.4%  Glycemic control for    <7.0% adults with diabetes   08/22/2020 02:10 AM 5.2 4.8 - 5.6 % Final    Comment:    (NOTE) Pre diabetes:  5.7%-6.4%  Diabetes:              >6.4%  Glycemic control for   <7.0% adults with diabetes     CBG: Recent Labs  Lab 08/24/20 1826 08/24/20 1828 08/24/20 1955 08/24/20 2106 08/26/20 0753  GLUCAP 38* 27* 353* 267* 132*    Recent Results (from the past 240 hour(s))  Culture, blood (routine x 2)     Status: None (Preliminary result)   Collection Time: 08/15/2020  8:35 PM   Specimen: BLOOD RIGHT HAND  Result Value Ref Range Status   Specimen Description   Final    BLOOD RIGHT HAND Performed at [facility], [address] [place], [place] [address]    Special Requests   Final    BOTTLES DRAWN AEROBIC ONLY Blood Culture adequate volume   Culture   Final    NO GROWTH 4 DAYS Performed at [facility], [address], [place], [place] [address]    Report Status PENDING  Incomplete  Culture, blood (routine x 2)     Status: None (Preliminary result)   Collection Time: 07/25/2020  8:36 PM   Specimen: BLOOD RIGHT FOREARM  Result Value Ref Range Status   Specimen Description   Final    BLOOD RIGHT FOREARM Performed at [facility], [address] [place], [place] [address]    Special Requests   Final    BOTTLES DRAWN AEROBIC AND ANAEROBIC Blood Culture adequate volume Performed at [facility], [address] [place], [place] [address]    Culture   Final    NO GROWTH 4 DAYS Performed at [facility], [address] [place], [place] [address]    Report Status PENDING  Incomplete  Respiratory Panel by RT PCR (Flu A&B, Covid) - Nasopharyngeal Swab     Status: None   Collection Time: 08/05/2020  9:12 PM   Specimen: Nasopharyngeal Swab  Result Value Ref Range Status   SARS Coronavirus 2 by RT PCR NEGATIVE NEGATIVE Final    Comment: (NOTE) SARS-CoV-2 target nucleic acids are NOT  DETECTED.  The SARS-CoV-2 RNA is generally detectable in upper respiratoy specimens during the acute phase of infection. The lowest concentration of SARS-CoV-2 viral copies this assay can detect is 131 copies/mL. A negative result does not preclude SARS-Cov-2 infection and should not be used as the sole basis for treatment or other patient management decisions. A negative result may occur with  improper specimen collection/handling, submission of specimen other than nasopharyngeal swab, presence of viral mutation(s) within the areas targeted by this assay, and inadequate number of viral copies (<131 copies/mL). A negative result must be combined with clinical observations, patient history, and epidemiological information. The expected result is Negative.  Fact Sheet for Patients:  https://www.moore.com/  Fact Sheet for Healthcare Providers:  https://www.young.biz/  This test is no t yet approved or cleared by the [place] FDA and  has been authorized for detection and/or diagnosis of SARS-CoV-2 by FDA under an Emergency Use Authorization (EUA). This EUA will remain  in effect (meaning this test can be used) for the duration of the COVID-19 declaration under Section 564(b)(1) of the Act, 21 U.S.C. section 360bbb-3(b)(1), unless the authorization is terminated or revoked sooner.     Influenza A by PCR NEGATIVE NEGATIVE Final   Influenza B by PCR NEGATIVE NEGATIVE Final    Comment: (NOTE) The Xpert Xpress SARS-CoV-2/FLU/RSV assay is intended as an aid in  the  diagnosis of influenza from Nasopharyngeal swab specimens and  should not be used as a sole basis for treatment. Nasal washings and  aspirates are unacceptable for Xpert Xpress SARS-CoV-2/FLU/RSV  testing.  Fact Sheet for Patients: https://www.moore.com/https://www.fda.gov/media/142436/download  Fact Sheet for Healthcare Providers: https://www.young.biz/https://www.fda.gov/media/142435/download  This test is not yet  approved or cleared by the [place] FDA and  has been authorized for detection and/or diagnosis of SARS-CoV-2 by  FDA under an Emergency Use Authorization (EUA). This EUA will remain  in effect (meaning this test can be used) for the duration of the  Covid-19 declaration under Section 564(b)(1) of the Act, 21  U.S.C. section 360bbb-3(b)(1), unless the authorization is  terminated or revoked. Performed at [facility], [address] [place], [place] [address]   Culture, Urine     Status: None   Collection Time: 08/04/2020 11:29 PM   Specimen: Urine, Clean Catch  Result Value Ref Range Status   Specimen Description   Final    URINE, CLEAN CATCH Performed at [facility], [address] [place], [place] [address]    Special Requests   Final    NONE Performed at [facility], [address] [place], [place] [address]    Culture   Final    NO GROWTH Performed at [facility], [address] [place], [place] [address]    Report Status 08/23/2020 FINAL  Final     Scheduled Meds: . enoxaparin (LOVENOX) injection  30 mg Subcutaneous Q24H  . feeding supplement  237 mL Oral BID BM   Continuous Infusions: . dextrose 50 mL/hr at 08/26/20 0359     LOS: 4 days   Lonia BloodJeffrey T. Alanzo Lamb, MD [facility] Office  [phone] Pager - Text Page per Loretha StaplerAmion  If 7PM-7AM, please contact night-coverage per Amion 08/26/2020, 8:55 AM

## 2020-08-27 DIAGNOSIS — E162 Hypoglycemia, unspecified: Secondary | ICD-10-CM | POA: Diagnosis not present

## 2020-08-27 DIAGNOSIS — E876 Hypokalemia: Secondary | ICD-10-CM | POA: Diagnosis not present

## 2020-08-27 LAB — CULTURE, BLOOD (ROUTINE X 2)
Culture: NO GROWTH
Culture: NO GROWTH
Special Requests: ADEQUATE
Special Requests: ADEQUATE

## 2020-08-27 NOTE — Progress Notes (Signed)
Erica Vasquez  NWG:[id number] DOB: Aug 16, 1930 DOA: 09/14/20 PCP: Mirna Mires, MD    Brief Narrative:  86 year old with a history of HTN, glaucoma, and advanced dementia who was brought to the hospital by her niece with mental status changes.  Niece stated that she had simply not been eating or talking as per her usual.  She was found to be hypoglycemic at presentation.  Significant Events:  10/31 admit via ED  Antimicrobials:  None  DVT prophylaxis: Full dose Lovenox  Subjective: Patient remains obtunded.  There is no evidence of respiratory distress or uncontrolled pain.  I have spoken with her son at length at bedside.  He is understandably having a hard time but is beginning to come to terms with the fact that his mother is not going to survive this hospital stay.  He comprehends the fact that she is not a candidate for PEG tube placement or PEG tube/NG tube feedings after our discussion.  Assessment & Plan:  Active decline The pt is actively declining and is expected to die in the hospital - her son feels a strong desire to continue to "do everything we can for her" and therefore we have not formally transitioned to full comfort care - nonetheless our conservative interventions are not bringing about any evidence of improvement and the pt is actively dying at this time   Acute CVA -acute metabolic encephalopathy MRI 11/1 noted an acute CVA - Neurology evaluated the patient - not felt to be appropriate for any further interventions or studies  Advanced dementia No change in mental status - unresponsive   Hypoglycemia -hypothermia No evidence of an acute infectious process -cortisol and TSH unrevealing -dosed with steroids and empiric antibiotics given persisting symptoms without significant improvement- added dextrose in IV fluid -her CBG readings are likely inaccurate due to poor peripheral perfusion  Acute systolic CHF EF 20-30% via TTE with global hypokinesis -not a  candidate for further invasive evaluation - slowed IV fluid to avoid overload - no present evidence of volume overload  Possible malignancy CT and exam noted soft tissue mass lower chest wall and there is also concern about a hyperdense area in the mid left kidney, as well as a left adnexal mass possibly consistent with a complex ovarian cyst - family reportedly aware and states patient did not desire further work-up previously  Acute PE Incidentally noted during stroke evaluation - Heparin changed to Lovenox due to difficulty obtaining blood for heparin levels - the pt is actively declining and I do not feel ongoing dosing of lovenox is providing her any significant benefit or will change her outcome therefore it has been discontinued - I discussed this w/ her niece at the bedside   Hypokalemia Corrected with supplementation  Pancytopenia Stable at this time  Underweight - Body mass index is 16.62 kg/m.  HLD LDL 204 -unable to tolerate oral intake therefore treatment not possible  Pressure Injury 08/22/20 Coccyx Posterior;Mid Stage 3 -  Full thickness tissue loss. Subcutaneous fat may be visible but bone, tendon or muscle are NOT exposed. sloughing skin, black and yellow in color (Active)  08/22/20 1030  Location: Coccyx  Location Orientation: Posterior;Mid  Staging: Stage 3 -  Full thickness tissue loss. Subcutaneous fat may be visible but bone, tendon or muscle are NOT exposed.  Wound Description (Comments): sloughing skin, black and yellow in color  Present on Admission: Yes     Pressure Injury 08/22/20 Buttocks Left Stage 2 -  Partial thickness loss  of dermis presenting as a shallow open injury with a red, pink wound bed without slough. skin tear- small (Active)  08/22/20 1030  Location: Buttocks  Location Orientation: Left  Staging: Stage 2 -  Partial thickness loss of dermis presenting as a shallow open injury with a red, pink wound bed without slough.  Wound Description  (Comments): skin tear- small  Present on Admission: Yes     Code Status: NO CODE BLUE Family Communication: spoke w/ niece at bedside  Status is: Inpatient  Remains inpatient appropriate because:Altered mental status   Dispo: The patient is from: Home              Anticipated d/c is to: hosital death expected              Anticipated d/c date is: > 3 days              Patient currently is not medically stable to d/c.   Consultants:  Palliative care  Objective: Blood pressure 115/71, pulse (!) 115, temperature 98.5 F (36.9 C), temperature source Oral, resp. rate 12, height 5' (1.524 m), weight 38.6 kg, SpO2 100 %.  Intake/Output Summary (Last 24 hours) at 08/27/2020 0901 Last data filed at 08/27/2020 0500 Gross per 24 hour  Intake 2359.04 ml  Output 285 ml  Net 2074.04 ml   Filed Weights   08/22/20 2227 08/24/20 0500  Weight: 32.7 kg 38.6 kg    Examination: General: No acute respiratory distress - obtunded  Lungs: shallow respirations  Cardiovascular: RRR Extremities: Cachectic, no significant edema  CBC: Recent Labs  Lab 08/15/2020 2035 08/17/2020 2130 08/22/20 0210 08/24/20 0602 08/25/20 0352  WBC 3.4*   < > 3.8* 3.4* 4.0  NEUTROABS 2.9  --  3.2  --   --   HGB 10.1*   < > 11.2* 10.9* 11.7*  HCT 32.8*   < > 35.5* 34.9* 38.5  MCV 91.9   < > 90.6 91.1 93.4  PLT 101*   < > 122* 105* 109*   < > = values in this interval not displayed.   Basic Metabolic Panel: Recent Labs  Lab 08/18/2020 2035 08/20/2020 2130 08/22/20 0210 08/22/20 1039 08/23/20 0349 08/23/20 1319 08/24/20 0602  NA 140   < > 145   < > 143 143 140  K 2.4*   < > 2.8*   < > 3.7 3.6 3.6  CL 103   < > 106   < > 106 104 105  CO2 26   < > 29   < > 26 27 26   GLUCOSE 773*   < > 220*   < > 67* 94 165*  BUN 33*   < > 31*   < > 21 22 24*  CREATININE 0.92   < > 0.82   < > 0.68 0.79 0.77  CALCIUM 8.7*   < > 8.7*   < > 8.7* 8.8* 8.6*  MG 2.1  --  2.2  --   --   --  1.8  PHOS  --   --   --   --   --    --  2.4*   < > = values in this interval not displayed.   GFR: Estimated Creatinine Clearance: 29.6 mL/min (by C-G formula based on SCr of 0.77 mg/dL).  Liver Function Tests: Recent Labs  Lab 08/01/2020 2035 08/22/20 0210 08/24/20 0602  AST 23 30 24   ALT 12 11 9   ALKPHOS 56 60 50  BILITOT  0.9 1.0 1.1  PROT 5.0* 5.5* 4.7*  ALBUMIN 2.9* 3.2* 2.6*   Recent Labs  Lab 08/18/2020 2035  LIPASE 28   Recent Labs  Lab 08/22/20 0210  AMMONIA 24    HbA1C: Hgb A1c MFr Bld  Date/Time Value Ref Range Status  08/23/2020 03:49 AM 5.2 4.8 - 5.6 % Final    Comment:    (NOTE) Pre diabetes:          5.7%-6.4%  Diabetes:              >6.4%  Glycemic control for   <7.0% adults with diabetes   08/22/2020 02:10 AM 5.2 4.8 - 5.6 % Final    Comment:    (NOTE) Pre diabetes:          5.7%-6.4%  Diabetes:              >6.4%  Glycemic control for   <7.0% adults with diabetes     CBG: Recent Labs  Lab 08/24/20 1828 08/24/20 1955 08/24/20 2106 08/26/20 0753 08/26/20 1209  GLUCAP 27* 353* 267* 132* 131*    Recent Results (from the past 240 hour(s))  Culture, blood (routine x 2)     Status: None   Collection Time: 07/26/2020  8:35 PM   Specimen: BLOOD RIGHT HAND  Result Value Ref Range Status   Specimen Description   Final    BLOOD RIGHT HAND Performed at [facility], [address] [place], [place] [address]    Special Requests   Final    BOTTLES DRAWN AEROBIC ONLY Blood Culture adequate volume   Culture   Final    NO GROWTH 5 DAYS Performed at [facility], [address] [place], [place] [address]    Report Status 08/27/2020 FINAL  Final  Culture, blood (routine x 2)     Status: None   Collection Time: 08/09/2020  8:36 PM   Specimen: BLOOD RIGHT FOREARM  Result Value Ref Range Status   Specimen Description   Final    BLOOD RIGHT FOREARM Performed at [facility], [address] [place], [place] [address]    Special  Requests   Final    BOTTLES DRAWN AEROBIC AND ANAEROBIC Blood Culture adequate volume Performed at [facility], [address] [place], [place] [address]    Culture   Final    NO GROWTH 5 DAYS Performed at [facility], [address] [place], [place] [address]    Report Status 08/27/2020 FINAL  Final  Respiratory Panel by RT PCR (Flu A&B, Covid) - Nasopharyngeal Swab     Status: None   Collection Time: 07/29/2020  9:12 PM   Specimen: Nasopharyngeal Swab  Result Value Ref Range Status   SARS Coronavirus 2 by RT PCR NEGATIVE NEGATIVE Final    Comment: (NOTE) SARS-CoV-2 target nucleic acids are NOT DETECTED.  The SARS-CoV-2 RNA is generally detectable in upper respiratoy specimens during the acute phase of infection. The lowest concentration of SARS-CoV-2 viral copies this assay can detect is 131 copies/mL. A negative result does not preclude SARS-Cov-2 infection and should not be used as the sole basis for treatment or other patient management decisions. A negative result may occur with  improper specimen collection/handling, submission of specimen other than nasopharyngeal swab, presence of viral mutation(s) within the areas targeted by this assay, and inadequate number of viral copies (<131 copies/mL). A negative result must be combined with clinical observations, patient history,  and epidemiological information. The expected result is Negative.  Fact Sheet for Patients:  https://www.moore.com/  Fact Sheet for Healthcare Providers:  https://www.young.biz/  This test is no t yet approved or cleared by the [place] FDA and  has been authorized for detection and/or diagnosis of SARS-CoV-2 by FDA under an Emergency Use Authorization (EUA). This EUA will remain  in effect (meaning this test can be used) for the duration of the COVID-19 declaration under Section 564(b)(1) of the Act, 21 U.S.C. section  360bbb-3(b)(1), unless the authorization is terminated or revoked sooner.     Influenza A by PCR NEGATIVE NEGATIVE Final   Influenza B by PCR NEGATIVE NEGATIVE Final    Comment: (NOTE) The Xpert Xpress SARS-CoV-2/FLU/RSV assay is intended as an aid in  the diagnosis of influenza from Nasopharyngeal swab specimens and  should not be used as a sole basis for treatment. Nasal washings and  aspirates are unacceptable for Xpert Xpress SARS-CoV-2/FLU/RSV  testing.  Fact Sheet for Patients: https://www.moore.com/  Fact Sheet for Healthcare Providers: https://www.young.biz/  This test is not yet approved or cleared by the [place] FDA and  has been authorized for detection and/or diagnosis of SARS-CoV-2 by  FDA under an Emergency Use Authorization (EUA). This EUA will remain  in effect (meaning this test can be used) for the duration of the  Covid-19 declaration under Section 564(b)(1) of the Act, 21  U.S.C. section 360bbb-3(b)(1), unless the authorization is  terminated or revoked. Performed at [facility], [address], [place], [place] [address]   Culture, Urine     Status: None   Collection Time: 09/16/2020 11:29 PM   Specimen: Urine, Clean Catch  Result Value Ref Range Status   Specimen Description   Final    URINE, CLEAN CATCH Performed at [facility], [address] [place], [place] [address]    Special Requests   Final    NONE Performed at [facility], [address], [place], [place] [address]    Culture   Final    NO GROWTH Performed at [facility], [address], [place], [place] [address]    Report Status 08/23/2020 FINAL  Final     Continuous Infusions: . dextrose 20 mL/hr at 08/27/20 0239     LOS: 5 days   Lonia Blood, MD [facility] Office  [phone] Pager - Text Page per Amion  If 7PM-7AM, please contact night-coverage per  Amion 08/27/2020, 9:01 AM

## 2020-08-27 NOTE — Progress Notes (Addendum)
   08/27/20 0805  Assess: MEWS Score  Temp 98.5 F (36.9 C)  BP 115/71  Pulse Rate (!) 115  Resp 12  Level of Consciousness Responds to Voice (eye muscle movement when called by her name)  SpO2 100 %  O2 Device Nasal Cannula  O2 Flow Rate (L/min) 4 L/min  Assess: MEWS Score  MEWS Temp 0  MEWS Systolic 0  MEWS Pulse 2  MEWS RR 1  MEWS LOC 1  MEWS Score 4  MEWS Score Color Red  Assess: if the MEWS score is Yellow or Red  Were vital signs taken at a resting state? Yes  Focused Assessment No change from prior assessment  Early Detection of Sepsis Score *See Row Information* Medium  MEWS guidelines implemented *See Row Information* No, previously red, continue vital signs every 4 hours  Treat  MEWS Interventions Other (Comment) (Dr. Sharon Seller notified)  Pain Scale PAINAD  Pain Score 0  Take Vital Signs  Increase Vital Sign Frequency  Red: Q 1hr X 4 then Q 4hr X 4, if remains red, continue Q 4hrs  Escalate  MEWS: Escalate Red: discuss with charge nurse/RN and provider, consider discussing with RRT  Notify: Charge Nurse/RN  Name of Charge Nurse/RN Notified Toniann Fail RN  Date Charge Nurse/RN Notified 08/27/20  Time Charge Nurse/RN Notified 0900  Notify: Provider  Provider Name/Title McClung  Date Provider Notified 08/27/20  Time Provider Notified 0900  Notification Type Face-to-face  Notification Reason Other (Comment)  Response No new orders   MD was notified face to face, no further order.

## 2020-08-27 NOTE — Progress Notes (Signed)
Spoke to patient' son and niece in length regarding complications of trying to tube feeding. Discussed patients risk of aspiration pna, poor wound healing with a possibility of infection. [name] verbalized understanding. Provided reassurance and allowed both to express their thoughts and concerns. Explained plan of care for the night.

## 2020-08-27 NOTE — Progress Notes (Signed)
   08/27/20 0447  Assess: MEWS Score  Temp 97.7 F (36.5 C)  BP 103/72  Pulse Rate (!) 124  Resp 13  SpO2 100 %  Assess: MEWS Score  MEWS Temp 0  MEWS Systolic 0  MEWS Pulse 2  MEWS RR 1  MEWS LOC 2  MEWS Score 5  MEWS Score Color Red  Assess: if the MEWS score is Yellow or Red  Were vital signs taken at a resting state? Yes  Focused Assessment No change from prior assessment  Early Detection of Sepsis Score *See Row Information* Medium  MEWS guidelines implemented *See Row Information* No, previously yellow, continue vital signs every 4 hours  Treat  MEWS Interventions Other (Comment) (reposition)  Pain Scale PAINAD  Breathing 0  Negative Vocalization 0  Facial Expression 0  Body Language 0  Consolability 0  PAINAD Score 0  Document  Patient Outcome Not stable and remains on department  Progress note created (see row info) Yes   Patient continues to be in RED MEWS through out the shift. No change in condition. MD aware of patients current state. No interventions to implement at this time. Pt appears comfortable with breathing. Repositioned throughout the night. No s/s of pain noted. Will continue to monitor. [name] was updated on care and condition earlier in the shift. Will continue to monitor.

## 2020-08-28 DIAGNOSIS — E876 Hypokalemia: Secondary | ICD-10-CM | POA: Diagnosis not present

## 2020-08-28 DIAGNOSIS — E162 Hypoglycemia, unspecified: Secondary | ICD-10-CM | POA: Diagnosis not present

## 2020-08-28 NOTE — Progress Notes (Signed)
Erica Vasquez  XIP:[id number] DOB: 1930/09/03 DOA: 08/15/2020 PCP: Mirna Mires, MD    Brief Narrative:  86 year old with a history of HTN, glaucoma, and advanced dementia who was brought to the hospital by her niece with mental status changes.  Niece stated that she had simply not been eating or talking as per her usual.  She was found to be hypoglycemic at presentation.  Significant Events:  10/31 admit via ED  Antimicrobials:  None  DVT prophylaxis: Full dose Lovenox  Subjective: Obtunded.  No evidence of respiratory distress or uncontrolled pain.  Assessment & Plan:  Active decline The pt is actively declining and is expected to die in the hospital - her son feels a strong desire to continue to "do everything we can for her" and therefore we have not formally transitioned to full comfort care - nonetheless our conservative interventions are not bringing about any evidence of improvement and the pt is actively dying at this time   Acute Thrombotic v/s embolic CVA - acute metabolic encephalopathy MRI 11/1 noted an acute CVA - Neurology evaluated the patient - not felt to be appropriate for any further interventions or studies  Advanced dementia No change in mental status - unresponsive   Hypoglycemia -hypothermia No evidence of an acute infectious process -cortisol and TSH unrevealing -dosed with steroids and empiric antibiotics given persisting symptoms without significant improvement- added dextrose in IV fluid -her CBG readings are likely inaccurate due to poor peripheral perfusion  Acute systolic CHF EF 20-30% via TTE with global hypokinesis -not a candidate for further invasive evaluation - slowed IV fluid to avoid overload - no present evidence of volume overload  Possible malignancy CT and exam noted soft tissue mass lower chest wall and there is also concern about a hyperdense area in the mid left kidney, as well as a left adnexal mass possibly consistent with a  complex ovarian cyst - family reportedly aware and states patient did not desire further work-up previously  Acute PE Incidentally noted during stroke evaluation - Heparin changed to Lovenox due to difficulty obtaining blood for heparin levels - the pt is actively declining and I do not feel ongoing dosing of lovenox is providing her any significant benefit or will change her outcome therefore it has been discontinued - I discussed this w/ her niece at the bedside   Hypokalemia Corrected with supplementation  Pancytopenia Stable at this time  Underweight - Body mass index is 16.62 kg/m.  HLD LDL 204 -unable to tolerate oral intake therefore treatment not possible  Pressure Injury 08/22/20 Coccyx Posterior;Mid Stage 3 -  Full thickness tissue loss. Subcutaneous fat may be visible but bone, tendon or muscle are NOT exposed. sloughing skin, black and yellow in color (Active)  08/22/20 1030  Location: Coccyx  Location Orientation: Posterior;Mid  Staging: Stage 3 -  Full thickness tissue loss. Subcutaneous fat may be visible but bone, tendon or muscle are NOT exposed.  Wound Description (Comments): sloughing skin, black and yellow in color  Present on Admission: Yes     Pressure Injury 08/22/20 Buttocks Left Stage 2 -  Partial thickness loss of dermis presenting as a shallow open injury with a red, pink wound bed without slough. skin tear- small (Active)  08/22/20 1030  Location: Buttocks  Location Orientation: Left  Staging: Stage 2 -  Partial thickness loss of dermis presenting as a shallow open injury with a red, pink wound bed without slough.  Wound Description (Comments): skin tear- small  Present on Admission: Yes     Code Status: NO CODE BLUE Family Communication: No family present at time of exam today Status is: Inpatient  Remains inpatient appropriate because:Altered mental status   Dispo: The patient is from: Home              Anticipated d/c is to: hosital death  expected              Anticipated d/c date is: > 3 days              Patient currently is not medically stable to d/c.   Consultants:  Palliative Care  Objective: Blood pressure 128/76, pulse 99, temperature 98.2 F (36.8 C), resp. rate 14, height 5' (1.524 m), weight 38.6 kg, SpO2 100 %.  Intake/Output Summary (Last 24 hours) at 08/28/2020 0928 Last data filed at 08/27/2020 1430 Gross per 24 hour  Intake --  Output 100 ml  Net -100 ml   Filed Weights   08/22/20 2227 08/24/20 0500  Weight: 32.7 kg 38.6 kg    Examination: General: No acute respiratory distress - obtunded  Lungs: shallow respirations  Cardiovascular: RRR Extremities: Cachectic  CBC: Recent Labs  Lab 08/18/2020 2035 08/05/2020 2130 08/22/20 0210 08/24/20 0602 08/25/20 0352  WBC 3.4*   < > 3.8* 3.4* 4.0  NEUTROABS 2.9  --  3.2  --   --   HGB 10.1*   < > 11.2* 10.9* 11.7*  HCT 32.8*   < > 35.5* 34.9* 38.5  MCV 91.9   < > 90.6 91.1 93.4  PLT 101*   < > 122* 105* 109*   < > = values in this interval not displayed.   Basic Metabolic Panel: Recent Labs  Lab 08/13/2020 2035 08/11/2020 2130 08/22/20 0210 08/22/20 1039 08/23/20 0349 08/23/20 1319 08/24/20 0602  NA 140   < > 145   < > 143 143 140  K 2.4*   < > 2.8*   < > 3.7 3.6 3.6  CL 103   < > 106   < > 106 104 105  CO2 26   < > 29   < > 26 27 26   GLUCOSE 773*   < > 220*   < > 67* 94 165*  BUN 33*   < > 31*   < > 21 22 24*  CREATININE 0.92   < > 0.82   < > 0.68 0.79 0.77  CALCIUM 8.7*   < > 8.7*   < > 8.7* 8.8* 8.6*  MG 2.1  --  2.2  --   --   --  1.8  PHOS  --   --   --   --   --   --  2.4*   < > = values in this interval not displayed.   GFR: Estimated Creatinine Clearance: 29.6 mL/min (by C-G formula based on SCr of 0.77 mg/dL).  Liver Function Tests: Recent Labs  Lab 08/19/2020 2035 08/22/20 0210 08/24/20 0602  AST 23 30 24   ALT 12 11 9   ALKPHOS 56 60 50  BILITOT 0.9 1.0 1.1  PROT 5.0* 5.5* 4.7*  ALBUMIN 2.9* 3.2* 2.6*   Recent Labs   Lab 08/18/2020 2035  LIPASE 28   Recent Labs  Lab 08/22/20 0210  AMMONIA 24    HbA1C: Hgb A1c MFr Bld  Date/Time Value Ref Range Status  08/23/2020 03:49 AM 5.2 4.8 - 5.6 % Final    Comment:    (NOTE) Pre diabetes:  5.7%-6.4%  Diabetes:              >6.4%  Glycemic control for   <7.0% adults with diabetes   08/22/2020 02:10 AM 5.2 4.8 - 5.6 % Final    Comment:    (NOTE) Pre diabetes:          5.7%-6.4%  Diabetes:              >6.4%  Glycemic control for   <7.0% adults with diabetes     CBG: Recent Labs  Lab 08/24/20 1828 08/24/20 1955 08/24/20 2106 08/26/20 0753 08/26/20 1209  GLUCAP 27* 353* 267* 132* 131*    Recent Results (from the past 240 hour(s))  Culture, blood (routine x 2)     Status: None   Collection Time: 08/11/2020  8:35 PM   Specimen: BLOOD RIGHT HAND  Result Value Ref Range Status   Specimen Description   Final    BLOOD RIGHT HAND Performed at [facility], [address] [place], [place] [address]    Special Requests   Final    BOTTLES DRAWN AEROBIC ONLY Blood Culture adequate volume   Culture   Final    NO GROWTH 5 DAYS Performed at [facility], [address] [place], [place] [address]    Report Status 08/27/2020 FINAL  Final  Culture, blood (routine x 2)     Status: None   Collection Time: 08/20/2020  8:36 PM   Specimen: BLOOD RIGHT FOREARM  Result Value Ref Range Status   Specimen Description   Final    BLOOD RIGHT FOREARM Performed at [facility], [address] [place], [place] [address]    Special Requests   Final    BOTTLES DRAWN AEROBIC AND ANAEROBIC Blood Culture adequate volume Performed at [facility], [address] [place], [place] [address]    Culture   Final    NO GROWTH 5 DAYS Performed at [facility], [address] [place], [place] [address]    Report Status 08/27/2020 FINAL  Final  Respiratory Panel by RT PCR (Flu A&B,  Covid) - Nasopharyngeal Swab     Status: None   Collection Time: 08/06/2020  9:12 PM   Specimen: Nasopharyngeal Swab  Result Value Ref Range Status   SARS Coronavirus 2 by RT PCR NEGATIVE NEGATIVE Final    Comment: (NOTE) SARS-CoV-2 target nucleic acids are NOT DETECTED.  The SARS-CoV-2 RNA is generally detectable in upper respiratoy specimens during the acute phase of infection. The lowest concentration of SARS-CoV-2 viral copies this assay can detect is 131 copies/mL. A negative result does not preclude SARS-Cov-2 infection and should not be used as the sole basis for treatment or other patient management decisions. A negative result may occur with  improper specimen collection/handling, submission of specimen other than nasopharyngeal swab, presence of viral mutation(s) within the areas targeted by this assay, and inadequate number of viral copies (<131 copies/mL). A negative result must be combined with clinical observations, patient history, and epidemiological information. The expected result is Negative.  Fact Sheet for Patients:  https://www.moore.com/https://www.fda.gov/media/142436/download  Fact Sheet for Healthcare Providers:  https://www.young.biz/https://www.fda.gov/media/142435/download  This test is no t yet approved or cleared by the [place] FDA and  has been authorized for detection and/or diagnosis of SARS-CoV-2 by FDA under an Emergency Use Authorization (EUA). This EUA will remain  in effect (meaning this test can be used) for the duration of the COVID-19 declaration under  Section 564(b)(1) of the Act, 21 U.S.C. section 360bbb-3(b)(1), unless the authorization is terminated or revoked sooner.     Influenza A by PCR NEGATIVE NEGATIVE Final   Influenza B by PCR NEGATIVE NEGATIVE Final    Comment: (NOTE) The Xpert Xpress SARS-CoV-2/FLU/RSV assay is intended as an aid in  the diagnosis of influenza from Nasopharyngeal swab specimens and  should not be used as a sole basis for treatment. Nasal  washings and  aspirates are unacceptable for Xpert Xpress SARS-CoV-2/FLU/RSV  testing.  Fact Sheet for Patients: https://www.moore.com/  Fact Sheet for Healthcare Providers: https://www.young.biz/  This test is not yet approved or cleared by the [place] FDA and  has been authorized for detection and/or diagnosis of SARS-CoV-2 by  FDA under an Emergency Use Authorization (EUA). This EUA will remain  in effect (meaning this test can be used) for the duration of the  Covid-19 declaration under Section 564(b)(1) of the Act, 21  U.S.C. section 360bbb-3(b)(1), unless the authorization is  terminated or revoked. Performed at [facility], [address], [place], [place] [address]   Culture, Urine     Status: None   Collection Time: 09/14/20 11:29 PM   Specimen: Urine, Clean Catch  Result Value Ref Range Status   Specimen Description   Final    URINE, CLEAN CATCH Performed at [facility], [address] [place], [place] [address]    Special Requests   Final    NONE Performed at [facility], [address] [place], [place] [address]    Culture   Final    NO GROWTH Performed at [facility], [address] [place], [place] [address]    Report Status 08/23/2020 FINAL  Final     Continuous Infusions: . dextrose 20 mL/hr at 08/27/20 0239     LOS: 6 days   Lonia Blood, MD [facility] Office  [phone] Pager - Text Page per Loretha Stapler  If 7PM-7AM, please contact night-coverage per [facility] 08/28/2020, 9:28 AM

## 2020-08-29 DIAGNOSIS — E162 Hypoglycemia, unspecified: Secondary | ICD-10-CM | POA: Diagnosis not present

## 2020-08-29 DIAGNOSIS — E876 Hypokalemia: Secondary | ICD-10-CM | POA: Diagnosis not present

## 2020-08-29 LAB — GLUCOSE, CAPILLARY
Glucose-Capillary: 221 mg/dL — ABNORMAL HIGH (ref 70–99)
Glucose-Capillary: 95 mg/dL (ref 70–99)

## 2020-08-29 MED ORDER — LORAZEPAM 2 MG/ML IJ SOLN: 1.0000 mg | INTRAMUSCULAR | Status: DC | PRN

## 2020-08-29 MED ORDER — MORPHINE SULFATE (PF) 2 MG/ML IV SOLN
2.0000 mg | INTRAVENOUS | Status: DC | PRN
  Administered 2020-08-29: 2 mg via INTRAVENOUS
  Filled 2020-08-29: qty 1

## 2020-08-29 NOTE — Progress Notes (Signed)
Decreased patient's O2 from 4L [place] to 3L [place]. Will continue to monitor patient.

## 2020-08-29 NOTE — Progress Notes (Signed)
   08/28/20 2009  Assess: MEWS Score  Temp 98 F (36.7 C)  BP (!) 147/73  Pulse Rate 86  Resp 13  SpO2 100 %  O2 Device Nasal Cannula  O2 Flow Rate (L/min) 4 L/min  Assess: MEWS Score  MEWS Temp 0  MEWS Systolic 0  MEWS Pulse 0  MEWS RR 1  MEWS LOC 1  MEWS Score 2  MEWS Score Color Yellow  Assess: if the MEWS score is Yellow or Red  Were vital signs taken at a resting state? Yes  Focused Assessment No change from prior assessment  Early Detection of Sepsis Score *See Row Information* Low  MEWS guidelines implemented *See Row Information* Yes  Treat  MEWS Interventions Escalated (See documentation below)  Pain Scale PAINAD  Pain Score 0  Breathing 0  Take Vital Signs  Increase Vital Sign Frequency  Yellow: Q 2hr X 2 then Q 4hr X 2, if remains yellow, continue Q 4hrs  Escalate  MEWS: Escalate Yellow: discuss with charge nurse/RN and consider discussing with provider and RRT  Notify: Charge Nurse/RN  Name of Charge Nurse/RN Notified Hui RN  Date Charge Nurse/RN Notified 08/28/20  Time Charge Nurse/RN Notified 2100  Document  Patient Outcome Stabilized after interventions  Progress note created (see row info) Yes

## 2020-08-29 NOTE — Progress Notes (Signed)
Decreased patient's O2 from 3L [place] to 1L [place]. Patient started to moan. Gave PRN Morphine. Will continue to monitor.

## 2020-08-29 NOTE — Care Management Important Message (Signed)
Important Message  Patient Details IM Letter given to the Patient Name: FELIX PRATT MRN: [id number] Date of Birth: 10-11-1930   Medicare Important Message Given:  Yes     Caren Macadam 08/29/2020, 11:14 AM

## 2020-08-29 NOTE — Progress Notes (Signed)
Patient's CBG was 65. Gave Dextrose 50%. Rechecked CBG 15 minutes later, CBG was 221. Will continue to monitor.

## 2020-08-29 NOTE — Progress Notes (Signed)
Erica Vasquez  VEL:[id number] DOB: November 14, 1929 DOA: 08/06/2020 PCP: Iona Beard, MD    Brief Narrative:  86 year old with a history of HTN, glaucoma, and advanced dementia who was brought to the hospital by her niece with mental status changes.  Niece stated that she had simply not been eating or talking as per her usual.  She was found to be hypoglycemic at presentation.  Significant Events:  10/31 admit via ED  Antimicrobials:  None  DVT prophylaxis: Full dose Lovenox  Subjective: I met with the patient's son and niece at the bedside.  We discussed her condition again.  They remain hopeful that she will begin to show signs of improvement.  I again explained that we are doing everything we can to support her and that she is not a candidate for any other interventions given her clinical condition.  I explained that I expect that she will not survive and that we should prepare ourselves for her death at any given time.  They voiced understanding but holding onto hope is very important to them, and understandable.  There is no evidence that she is in distress or uncontrolled pain at this time.  Assessment & Plan:  Active decline The pt is actively declining and is expected to die in the hospital - her son feels a strong desire to continue to "do everything we can for her" and therefore we have not formally transitioned to full comfort care - nonetheless our conservative interventions are not bringing about any evidence of improvement and the pt is actively dying at this time   Acute Thrombotic v/s embolic CVA - acute metabolic encephalopathy MRI 11/1 noted an acute CVA - Neurology evaluated the patient - not felt to be appropriate for any further interventions or studies  Advanced dementia No change in mental status - unresponsive   Hypoglycemia -hypothermia No evidence of an acute infectious process -cortisol and TSH unrevealing -dosed with steroids and empiric antibiotics given  persisting symptoms without significant improvement- added dextrose in IV fluid -her CBG readings are likely inaccurate due to poor peripheral perfusion  Acute systolic CHF EF 25-85% via TTE with global hypokinesis -not a candidate for further invasive evaluation - slowed IV fluid to avoid overload - no present evidence of volume overload  Possible malignancy CT and exam noted soft tissue mass lower chest wall and there is also concern about a hyperdense area in the mid left kidney, as well as a left adnexal mass possibly consistent with a complex ovarian cyst - family reportedly aware and states patient did not desire further work-up previously  Acute PE Incidentally noted during stroke evaluation - Heparin changed to Lovenox due to difficulty obtaining blood for heparin levels - the pt is actively declining and I do not feel ongoing dosing of lovenox is providing her any significant benefit or will change her outcome therefore it has been discontinued - I discussed this w/ her niece at the bedside   Hypokalemia Corrected with supplementation  Pancytopenia Stable at this time  Underweight - Body mass index is 16.62 kg/m.  HLD LDL 204 -unable to tolerate oral intake therefore treatment not possible  Pressure Injury 08/22/20 Coccyx Posterior;Mid Stage 3 -  Full thickness tissue loss. Subcutaneous fat may be visible but bone, tendon or muscle are NOT exposed. sloughing skin, black and yellow in color (Active)  08/22/20 1030  Location: Coccyx  Location Orientation: Posterior;Mid  Staging: Stage 3 -  Full thickness tissue loss. Subcutaneous fat may be  visible but bone, tendon or muscle are NOT exposed.  Wound Description (Comments): sloughing skin, black and yellow in color  Present on Admission: Yes     Pressure Injury 08/22/20 Buttocks Left Stage 2 -  Partial thickness loss of dermis presenting as a shallow open injury with a red, pink wound bed without slough. skin tear- small (Active)   08/22/20 1030  Location: Buttocks  Location Orientation: Left  Staging: Stage 2 -  Partial thickness loss of dermis presenting as a shallow open injury with a red, pink wound bed without slough.  Wound Description (Comments): skin tear- small  Present on Admission: Yes     Code Status: NO CODE BLUE Family Communication: No family present at time of exam today Status is: Inpatient  Remains inpatient appropriate because:Altered mental status   Dispo: The patient is from: Home              Anticipated d/c is to: hosital death expected              Anticipated d/c date is: > 3 days              Patient currently is not medically stable to d/c.   Consultants:  Palliative Care  Objective: Blood pressure 139/68, pulse 84, temperature 98.2 F (36.8 C), temperature source Axillary, resp. rate 14, height 5' (1.524 m), weight 38.6 kg, SpO2 100 %.  Intake/Output Summary (Last 24 hours) at 08/29/2020 0912 Last data filed at 08/28/2020 1623 Gross per 24 hour  Intake 0 ml  Output --  Net 0 ml   Filed Weights   08/22/20 2227 08/24/20 0500  Weight: 32.7 kg 38.6 kg    Examination: General: remains obtunded  Lungs: shallow respirations  Cardiovascular: RRR Extremities: Cachectic  CBC: Recent Labs  Lab 08/24/20 0602 08/25/20 0352  WBC 3.4* 4.0  HGB 10.9* 11.7*  HCT 34.9* 38.5  MCV 91.1 93.4  PLT 105* 614*   Basic Metabolic Panel: Recent Labs  Lab 08/23/20 0349 08/23/20 1319 08/24/20 0602  NA 143 143 140  K 3.7 3.6 3.6  CL 106 104 105  CO2 $Re'26 27 26  'Exl$ GLUCOSE 67* 94 165*  BUN 21 22 24*  CREATININE 0.68 0.79 0.77  CALCIUM 8.7* 8.8* 8.6*  MG  --   --  1.8  PHOS  --   --  2.4*   GFR: Estimated Creatinine Clearance: 29.6 mL/min (by C-G formula based on SCr of 0.77 mg/dL).  Liver Function Tests: Recent Labs  Lab 08/24/20 0602  AST 24  ALT 9  ALKPHOS 50  BILITOT 1.1  PROT 4.7*  ALBUMIN 2.6*   No results for input(s): LIPASE, AMYLASE in the last 168  hours. No results for input(s): AMMONIA in the last 168 hours.  HbA1C: Hgb A1c MFr Bld  Date/Time Value Ref Range Status  08/23/2020 03:49 AM 5.2 4.8 - 5.6 % Final    Comment:    (NOTE) Pre diabetes:          5.7%-6.4%  Diabetes:              >6.4%  Glycemic control for   <7.0% adults with diabetes   08/22/2020 02:10 AM 5.2 4.8 - 5.6 % Final    Comment:    (NOTE) Pre diabetes:          5.7%-6.4%  Diabetes:              >6.4%  Glycemic control for   <7.0% adults with diabetes  CBG: Recent Labs  Lab 08/24/20 1828 08/24/20 1955 08/24/20 2106 08/26/20 0753 08/26/20 1209  GLUCAP 27* 353* 267* 132* 131*    Recent Results (from the past 240 hour(s))  Culture, blood (routine x 2)     Status: None   Collection Time: 07/23/2020  8:35 PM   Specimen: BLOOD RIGHT HAND  Result Value Ref Range Status   Specimen Description   Final    BLOOD RIGHT HAND Performed at [facility], [place] [address], [place], [place] [address]    Special Requests   Final    BOTTLES DRAWN AEROBIC ONLY Blood Culture adequate volume   Culture   Final    NO GROWTH 5 DAYS Performed at [facility], [place] [address], [place], [place] [address]    Report Status 08/27/2020 FINAL  Final  Culture, blood (routine x 2)     Status: None   Collection Time: 08/18/2020  8:36 PM   Specimen: BLOOD RIGHT FOREARM  Result Value Ref Range Status   Specimen Description   Final    BLOOD RIGHT FOREARM Performed at [place] [address], [place], [place] [address]    Special Requests   Final    BOTTLES DRAWN AEROBIC AND ANAEROBIC Blood Culture adequate volume Performed at [place] [address], [place], [place] [address]    Culture   Final    NO GROWTH 5 DAYS Performed at [facility], [place] [address] [place], [place] [address]    Report Status 08/27/2020 FINAL  Final  Respiratory Panel by RT PCR (Flu A&B, Covid) - Nasopharyngeal  Swab     Status: None   Collection Time: 08/01/2020  9:12 PM   Specimen: Nasopharyngeal Swab  Result Value Ref Range Status   SARS Coronavirus 2 by RT PCR NEGATIVE NEGATIVE Final    Comment: (NOTE) SARS-CoV-2 target nucleic acids are NOT DETECTED.  The SARS-CoV-2 RNA is generally detectable in upper respiratoy specimens during the acute phase of infection. The lowest concentration of SARS-CoV-2 viral copies this assay can detect is 131 copies/mL. A negative result does not preclude SARS-Cov-2 infection and should not be used as the sole basis for treatment or other patient management decisions. A negative result may occur with  improper specimen collection/handling, submission of specimen other than nasopharyngeal swab, presence of viral mutation(s) within the areas targeted by this assay, and inadequate number of viral copies (<131 copies/mL). A negative result must be combined with clinical observations, patient history, and epidemiological information. The expected result is Negative.  Fact Sheet for Patients:  PinkCheek.be  Fact Sheet for Healthcare Providers:  GravelBags.it  This test is no t yet approved or cleared by the [place] FDA and  has been authorized for detection and/or diagnosis of SARS-CoV-2 by FDA under an Emergency Use Authorization (EUA). This EUA will remain  in effect (meaning this test can be used) for the duration of the COVID-19 declaration under Section 564(b)(1) of the Act, 21 U.S.C. section 360bbb-3(b)(1), unless the authorization is terminated or revoked sooner.     Influenza A by PCR NEGATIVE NEGATIVE Final   Influenza B by PCR NEGATIVE NEGATIVE Final    Comment: (NOTE) The Xpert Xpress SARS-CoV-2/FLU/RSV assay is intended as an aid in  the diagnosis of influenza from Nasopharyngeal swab specimens and  should not be used as a sole basis for treatment. Nasal washings and  aspirates are  unacceptable for Xpert Xpress SARS-CoV-2/FLU/RSV  testing.  Fact Sheet for Patients: PinkCheek.be  Fact Sheet for Healthcare Providers: GravelBags.it  This test is not yet approved or cleared by the [place] FDA and  has been authorized for detection and/or diagnosis of SARS-CoV-2 by  FDA under an Emergency Use Authorization (EUA). This EUA will remain  in effect (meaning this test can be used) for the duration of the  Covid-19 declaration under Section 564(b)(1) of the Act, 21  U.S.C. section 360bbb-3(b)(1), unless the authorization is  terminated or revoked. Performed at [facility], [place] [address], [place], [place] [address]   Culture, Urine     Status: None   Collection Time: 07/30/2020 11:29 PM   Specimen: Urine, Clean Catch  Result Value Ref Range Status   Specimen Description   Final    URINE, CLEAN CATCH Performed at [facility], [place] [address] [place], [place] [address]    Special Requests   Final    NONE Performed at [facility], [place] [address], [place], [place] [address]    Culture   Final    NO GROWTH Performed at [facility], [place] [address], [place],  [address]    Report Status 08/23/2020 FINAL  Final     Continuous Infusions: . dextrose 20 mL/hr at 08/28/20 2024     LOS: 7 days   Cherene Altes, MD [facility] Office  [phone] Pager - Text Page per Amion  If 7PM-7AM, please contact night-coverage per Amion 08/29/2020, 9:12 AM

## 2020-08-30 DIAGNOSIS — E162 Hypoglycemia, unspecified: Secondary | ICD-10-CM | POA: Diagnosis not present

## 2020-08-30 DIAGNOSIS — E876 Hypokalemia: Secondary | ICD-10-CM | POA: Diagnosis not present

## 2020-08-30 NOTE — Progress Notes (Signed)
   08/30/20 0450  Assess: MEWS Score  Temp 97.7 F (36.5 C)  BP 139/63  Pulse Rate 65  Resp (!) 8  SpO2 100 %  O2 Device Room Air  Assess: MEWS Score  MEWS Temp 0  MEWS Systolic 0  MEWS Pulse 0  MEWS RR 1  MEWS LOC 1  MEWS Score 2  MEWS Score Color Yellow  Assess: if the MEWS score is Yellow or Red  Were vital signs taken at a resting state? Yes  Focused Assessment No change from prior assessment  Early Detection of Sepsis Score *See Row Information* Low  MEWS guidelines implemented *See Row Information* Yes  Treat  MEWS Interventions Escalated (See documentation below)  Pain Scale PAINAD  Pain Score 0  Breathing 0  Take Vital Signs  Increase Vital Sign Frequency  Yellow: Q 2hr X 2 then Q 4hr X 2, if remains yellow, continue Q 4hrs  Escalate  MEWS: Escalate Yellow: discuss with charge nurse/RN and consider discussing with provider and RRT  Notify: Charge Nurse/RN  Name of Charge Nurse/RN Notified Vera RN   Date Charge Nurse/RN Notified 08/30/20  Time Charge Nurse/RN Notified 0450  Document  Patient Outcome Other (Comment) (No change)  Progress note created (see row info) Yes

## 2020-08-30 NOTE — Progress Notes (Signed)
   08/30/20 1933  Assess: MEWS Score  Temp 98.1 F (36.7 C)  BP 100/64  Pulse Rate (!) 112  Resp 14  SpO2 98 %  O2 Device Room Air  Assess: MEWS Score  MEWS Temp 0  MEWS Systolic 1  MEWS Pulse 2  MEWS RR 0  MEWS LOC 1  MEWS Score 4  MEWS Score Color Red  Assess: if the MEWS score is Yellow or Red  Were vital signs taken at a resting state? Yes  Focused Assessment No change from prior assessment  Early Detection of Sepsis Score *See Row Information* Low  MEWS guidelines implemented *See Row Information* Yes  Treat  MEWS Interventions Escalated (See documentation below)  Pain Scale PAINAD  Pain Score 0  Breathing 0  Take Vital Signs  Increase Vital Sign Frequency  Red: Q 1hr X 4 then Q 4hr X 4, if remains red, continue Q 4hrs  Escalate  MEWS: Escalate Red: discuss with charge nurse/RN and provider, consider discussing with RRT  Notify: Charge Nurse/RN  Name of Charge Nurse/RN Notified Marcelino Duster RN  Date Charge Nurse/RN Notified 08/30/20  Time Charge Nurse/RN Notified 1940  Notify: Provider  Provider Name/Title Cherylin Mylar NP  Date Provider Notified 08/30/20  Time Provider Notified 1940  Notification Type Page  Notification Reason Change in status  Response No new orders  Document  Patient Outcome Not stable and remains on department  Progress note created (see row info) Yes

## 2020-08-30 NOTE — Progress Notes (Signed)
Erica Vasquez  QIH:[id number] DOB: 17-Sep-1930 DOA: 09/15/20 PCP: Mirna Mires, MD    Brief Narrative:  86 year old with a history of HTN, glaucoma, and advanced dementia who was brought to the hospital by her niece with mental status changes.  Niece stated that she had simply not been eating or talking as per her usual.  She was found to be hypoglycemic at presentation.  Significant Events:  10/31 admit via ED  Antimicrobials:  None  DVT prophylaxis: Not clinically indicated   Subjective: Remains obtunded.  No evidence of discomfort or respiratory distress.  Assessment & Plan:  Active decline The pt is actively declining and is expected to die in the hospital - her son feels a strong desire to continue to "do everything we can for her" and therefore we have not formally transitioned to full comfort care - nonetheless our conservative interventions are not bringing about any evidence of improvement and the pt is actively dying at this time   Acute Thrombotic v/s embolic CVA - acute metabolic encephalopathy MRI 11/1 noted an acute CVA - Neurology evaluated the patient - not felt to be appropriate for any further interventions or studies  Advanced dementia No change in mental status - unresponsive   Hypoglycemia -hypothermia No evidence of an acute infectious process -cortisol and TSH unrevealing -dosed with steroids and empiric antibiotics given persisting symptoms without significant improvement- added dextrose in IV fluid -her CBG readings are likely inaccurate due to poor peripheral perfusion  Acute systolic CHF EF 20-30% via TTE with global hypokinesis -not a candidate for further invasive evaluation - slowed IV fluid to avoid overload - no present evidence of volume overload  Possible malignancy CT and exam noted soft tissue mass lower chest wall and there is also concern about a hyperdense area in the mid left kidney, as well as a left adnexal mass possibly consistent  with a complex ovarian cyst - family reportedly aware and states patient did not desire further work-up previously  Acute PE Incidentally noted during stroke evaluation - Heparin changed to Lovenox due to difficulty obtaining blood for heparin levels - the pt is actively declining and I do not feel ongoing dosing of lovenox is providing her any significant benefit or will change her outcome therefore it has been discontinued - I discussed this w/ her niece at the bedside   Hypokalemia Corrected with supplementation  Pancytopenia Stable at this time  Underweight - Body mass index is 16.62 kg/m.  HLD LDL 204 -unable to tolerate oral intake therefore treatment not possible  Pressure Injury 08/22/20 Coccyx Posterior;Mid Stage 3 -  Full thickness tissue loss. Subcutaneous fat may be visible but bone, tendon or muscle are NOT exposed. sloughing skin, black and yellow in color (Active)  08/22/20 1030  Location: Coccyx  Location Orientation: Posterior;Mid  Staging: Stage 3 -  Full thickness tissue loss. Subcutaneous fat may be visible but bone, tendon or muscle are NOT exposed.  Wound Description (Comments): sloughing skin, black and yellow in color  Present on Admission: Yes     Pressure Injury 08/22/20 Buttocks Left Stage 2 -  Partial thickness loss of dermis presenting as a shallow open injury with a red, pink wound bed without slough. skin tear- small (Active)  08/22/20 1030  Location: Buttocks  Location Orientation: Left  Staging: Stage 2 -  Partial thickness loss of dermis presenting as a shallow open injury with a red, pink wound bed without slough.  Wound Description (Comments): skin tear- small  Present on Admission: Yes     Code Status: NO CODE BLUE Family Communication: No family present at time of exam today Status is: Inpatient  Remains inpatient appropriate because:Altered mental status   Dispo: The patient is from: Home              Anticipated d/c is to: hosital  death expected              Anticipated d/c date is: > 3 days              Patient currently is not medically stable to d/c.   Consultants:  Palliative Care  Objective: Blood pressure 130/66, pulse 91, temperature (!) 97.5 F (36.4 C), temperature source Oral, resp. rate 13, height 5' (1.524 m), weight 38.6 kg, SpO2 99 %. No intake or output data in the 24 hours ending 08/30/20 0910 Filed Weights   08/22/20 2227 08/24/20 0500  Weight: 32.7 kg 38.6 kg    Examination: General: remains obtunded -no evidence of discomfort Lungs: shallow respirations  Cardiovascular: RRR Extremities: Cachectic   Continuous Infusions: . dextrose 10 mL/hr at 08/29/20 1020     LOS: 8 days   Lonia Blood, MD [facility] Office  [phone] Pager - Text Page per Loretha Stapler  If 7PM-7AM, please contact night-coverage per Amion 08/30/2020, 9:10 AM

## 2020-08-31 ENCOUNTER — Inpatient Hospital Stay (HOSPITAL_COMMUNITY): Payer: Medicare PPO

## 2020-08-31 DIAGNOSIS — G9341 Metabolic encephalopathy: Secondary | ICD-10-CM | POA: Diagnosis not present

## 2020-08-31 DIAGNOSIS — R4182 Altered mental status, unspecified: Secondary | ICD-10-CM | POA: Diagnosis not present

## 2020-08-31 DIAGNOSIS — I639 Cerebral infarction, unspecified: Secondary | ICD-10-CM | POA: Diagnosis not present

## 2020-08-31 LAB — GLUCOSE, CAPILLARY: Glucose-Capillary: 117 mg/dL — ABNORMAL HIGH (ref 70–99)

## 2020-08-31 NOTE — Progress Notes (Signed)
PROGRESS NOTE  Erica Vasquez NID:[id number] DOB: 1930-07-10 DOA: 09/02/2020 PCP: Mirna Mires, MD   LOS: 9 days   Brief Narrative / Interim history: 86 year old female with history of HTN, glaucoma, advanced dementia, presents to the hospital brought by her niece with mental status changes.  Niece reports that patient was not eating and was not talking, she does talk sometimes but is incoherent.  She was just holding her food in her mouth.  She was also found to be hypoglycemic.  Subjective / 24h Interval events: Remains unresponsive  Assessment & Plan: Principal Problem Active decline, end-of-life care -Patient is actively declining, she remains obtunded.  Expected The hospital.  The son feels a strong desire to try to do everything that can be done for her, continue medical management.  She is a DNR.  Active problems Acute CVA, acute metabolic encephalopathy -MRI done on admission 11/1 showed acute CVA.  Neurology consulted and evaluated patient.  Not appropriate for any further interventional studies  Advanced dementia, goals of care discussions -Palliative following  Hypoglycemia, hypothermia -No evidence of acute infectious process, cortisol and TSH unrevealing.  Likely at the end of her life  Acute systolic CHF -Underwent 2D echo as part of the work-up for CVA which showed an EF of 20-30% with global hypokinesis. Based on imaging it appears to be Takotsubo cardiomyopathy versus wraparound LAD. Cautious with fluids. -Not a candidate for aggressive interventions  Possible malignancy -CT scan shows soft tissue mass in the lower chest wall, there is also concern about hyperdense area in the mid left kidney, also left adnexal masses which may represent large, complex ovarian cyst.  This was known to the family and son tells me that she is known about the breast mass for a while now, did not want further work-up in the past and agrees with just observing for now  Acute  PE -Picked up CT angio of the head and neck, partially visualized.  Initially on anticoagulation however it is unlikely to provide her significant benefit or change her outcome and therefore it has been discontinued.  Hypokalemia -Continue to replete as indicated  Pancytopenia -Likely multifactorial, chronic illness, poor nutrition  Hyperlipidemia -Unable to tolerate oral statins  Pressure Injury 08/22/20 Coccyx Posterior;Mid Stage 3 -  Full thickness tissue loss. Subcutaneous fat may be visible but bone, tendon or muscle are NOT exposed. sloughing skin, black and yellow in color (Active)  08/22/20 1030  Location: Coccyx  Location Orientation: Posterior;Mid  Staging: Stage 3 -  Full thickness tissue loss. Subcutaneous fat may be visible but bone, tendon or muscle are NOT exposed.  Wound Description (Comments): sloughing skin, black and yellow in color  Present on Admission: Yes     Pressure Injury 08/22/20 Buttocks Left Stage 2 -  Partial thickness loss of dermis presenting as a shallow open injury with a red, pink wound bed without slough. skin tear- small (Active)  08/22/20 1030  Location: Buttocks  Location Orientation: Left  Staging: Stage 2 -  Partial thickness loss of dermis presenting as a shallow open injury with a red, pink wound bed without slough.  Wound Description (Comments): skin tear- small  Present on Admission: Yes    Scheduled Meds:  Continuous Infusions: . dextrose 10 mL/hr at 08/29/20 1020   PRN Meds:.acetaminophen **OR** acetaminophen, dextrose, lip balm, LORazepam, methocarbamol, morphine injection, ondansetron **OR** ondansetron (ZOFRAN) IV, senna-docusate  Diet Orders (From admission, onward)    Start     Ordered   08/22/20 1551  Diet NPO time specified  Diet effective now        08/22/20 1551          DVT prophylaxis:     Code Status: DNR  Family Communication: No family at bedside  Status is: Inpatient  Remains inpatient appropriate  because:Hemodynamically unstable   Dispo: The patient is from: Home              Anticipated d/c is to: Home              Anticipated d/c date is: > 3 days              Patient currently is not medically stable to d/c.  Consultants:  Neurology Palliative  Procedures:  None  Microbiology  Blood cultures-pending  Antimicrobials: Vancomycin 11/1 >> Cefepime 11/1 >>   Objective: Vitals:   08/30/20 2235 08/30/20 2333 08/31/20 0328 08/31/20 0729  BP: 122/70 126/72 118/74 107/71  Pulse: (!) 113 (!) 115 (!) 119 (!) 108  Resp: 14 14 16 14   Temp: 97.8 F (36.6 C) (!) 97.4 F (36.3 C) 98.1 F (36.7 C) 98.1 F (36.7 C)  TempSrc: Axillary Oral    SpO2: 99% 99% 98% 99%  Weight:      Height:       No intake or output data in the 24 hours ending 08/31/20 1104 Filed Weights   08/22/20 2227 08/24/20 0500  Weight: 32.7 kg 38.6 kg    Examination:  Constitutional: Unresponsive Eyes: No scleral icterus ENMT: Dry mucous membranes Neck: normal, supple Respiratory: Shallow respirations, no wheezing Cardiovascular: Regular rate and rhythm, no murmurs, no edema Abdomen: Nondistended, bowel sounds positive Skin: No rashes appreciated Neurologic: Obtunded  Data Reviewed: I have independently reviewed following labs and imaging studies   CBC: Recent Labs  Lab 08/25/20 0352  WBC 4.0  HGB 11.7*  HCT 38.5  MCV 93.4  PLT 109*   Basic Metabolic Panel: No results for input(s): NA, K, CL, CO2, GLUCOSE, BUN, CREATININE, CALCIUM, MG, PHOS in the last 168 hours. Liver Function Tests: No results for input(s): AST, ALT, ALKPHOS, BILITOT, PROT, ALBUMIN in the last 168 hours. Coagulation Profile: No results for input(s): INR, PROTIME in the last 168 hours. HbA1C: No results for input(s): HGBA1C in the last 72 hours. CBG: Recent Labs  Lab 08/26/20 0753 08/26/20 1209 08/29/20 1915 08/29/20 2319 08/31/20 0726  GLUCAP 132* 131* 221* 95 117*    Recent Results (from the past 240  hour(s))  Culture, blood (routine x 2)     Status: None   Collection Time: 2020/09/20  8:35 PM   Specimen: BLOOD RIGHT HAND  Result Value Ref Range Status   Specimen Description   Final    BLOOD RIGHT HAND Performed at [facility], [address], [place], [place]    Special Requests   Final    BOTTLES DRAWN AEROBIC ONLY Blood Culture adequate volume   Culture   Final    NO GROWTH 5 DAYS Performed at [facility], [address], [place], [place]    Report Status 08/27/2020 FINAL  Final  Culture, blood (routine x 2)     Status: None   Collection Time: 2020/09/20  8:36 PM   Specimen: BLOOD RIGHT FOREARM  Result Value Ref Range Status   Specimen Description   Final    BLOOD RIGHT FOREARM Performed at [facility], [address], [place], [place]  Special Requests   Final    BOTTLES DRAWN AEROBIC AND ANAEROBIC Blood Culture adequate volume Performed at [facility], [address], [place], [place] [address]    Culture   Final    NO GROWTH 5 DAYS Performed at [facility], [address], [place], [place] [address]    Report Status 08/27/2020 FINAL  Final  Respiratory Panel by RT PCR (Flu A&B, Covid) - Nasopharyngeal Swab     Status: None   Collection Time: 08/04/2020  9:12 PM   Specimen: Nasopharyngeal Swab  Result Value Ref Range Status   SARS Coronavirus 2 by RT PCR NEGATIVE NEGATIVE Final    Comment: (NOTE) SARS-CoV-2 target nucleic acids are NOT DETECTED.  The SARS-CoV-2 RNA is generally detectable in upper respiratoy specimens during the acute phase of infection. The lowest concentration of SARS-CoV-2 viral copies this assay can detect is 131 copies/mL. A negative result does not preclude SARS-Cov-2 infection and should not be used as the sole basis for treatment or other patient management decisions. A negative result may occur with  improper specimen  collection/handling, submission of specimen other than nasopharyngeal swab, presence of viral mutation(s) within the areas targeted by this assay, and inadequate number of viral copies (<131 copies/mL). A negative result must be combined with clinical observations, patient history, and epidemiological information. The expected result is Negative.  Fact Sheet for Patients:  https://www.moore.com/  Fact Sheet for Healthcare Providers:  https://www.young.biz/  This test is no t yet approved or cleared by the [place] FDA and  has been authorized for detection and/or diagnosis of SARS-CoV-2 by FDA under an Emergency Use Authorization (EUA). This EUA will remain  in effect (meaning this test can be used) for the duration of the COVID-19 declaration under Section 564(b)(1) of the Act, 21 U.S.C. section 360bbb-3(b)(1), unless the authorization is terminated or revoked sooner.     Influenza A by PCR NEGATIVE NEGATIVE Final   Influenza B by PCR NEGATIVE NEGATIVE Final    Comment: (NOTE) The Xpert Xpress SARS-CoV-2/FLU/RSV assay is intended as an aid in  the diagnosis of influenza from Nasopharyngeal swab specimens and  should not be used as a sole basis for treatment. Nasal washings and  aspirates are unacceptable for Xpert Xpress SARS-CoV-2/FLU/RSV  testing.  Fact Sheet for Patients: https://www.moore.com/  Fact Sheet for Healthcare Providers: https://www.young.biz/  This test is not yet approved or cleared by the [place] FDA and  has been authorized for detection and/or diagnosis of SARS-CoV-2 by  FDA under an Emergency Use Authorization (EUA). This EUA will remain  in effect (meaning this test can be used) for the duration of the  Covid-19 declaration under Section 564(b)(1) of the Act, 21  U.S.C. section 360bbb-3(b)(1), unless the authorization is  terminated or revoked. Performed at [facility], [address], [place], [place] [address]   Culture, Urine     Status: None   Collection Time: 07/30/2020 11:29 PM   Specimen: Urine, Clean Catch  Result Value Ref Range Status   Specimen Description   Final    URINE, CLEAN CATCH Performed at [facility], [address], [place], [place] [address]    Special Requests   Final    NONE Performed at [facility], [address], [place], [place] [address]    Culture   Final    NO GROWTH Performed at [facility], [address], [place], [place] [address]  Report Status 08/23/2020 FINAL  Final     Radiology Studies: No results found. Pamella Pertostin Melany Wiesman, MD, PhD [facility]  Between 7 am - 7 pm I am available, please contact me via Amion or Securechat  Between 7 pm - 7 am I am not available, please contact night coverage MD/APP via Amion

## 2020-09-01 DIAGNOSIS — G9341 Metabolic encephalopathy: Secondary | ICD-10-CM | POA: Diagnosis not present

## 2020-09-01 DIAGNOSIS — Z515 Encounter for palliative care: Secondary | ICD-10-CM | POA: Diagnosis not present

## 2020-09-01 DIAGNOSIS — G309 Alzheimer's disease, unspecified: Secondary | ICD-10-CM | POA: Diagnosis not present

## 2020-09-01 DIAGNOSIS — F0280 Dementia in other diseases classified elsewhere without behavioral disturbance: Secondary | ICD-10-CM | POA: Diagnosis not present

## 2020-09-01 DIAGNOSIS — Z7189 Other specified counseling: Secondary | ICD-10-CM | POA: Diagnosis not present

## 2020-09-01 NOTE — Progress Notes (Signed)
Daily Progress Note   Patient Name: Erica Vasquez       Date: 09/01/2020 DOB: 1930/09/10  Age: 86 y.o. MRN#: [id number] Attending Physician: Leatha Gilding, MD Primary Care Physician: Mirna Mires, MD Admit Date: 08-28-2020  Reason for Consultation/Follow-up: Establishing goals of care  Subjective: Frail weak lady Not awake not alert Actively dying.    Length of Stay: 10  Current Medications: Scheduled Meds:    Continuous Infusions: . dextrose 10 mL/hr at 08/29/20 1020    PRN Meds: acetaminophen **OR** acetaminophen, dextrose, lip balm, LORazepam, methocarbamol, morphine injection, ondansetron **OR** ondansetron (ZOFRAN) IV, senna-docusate  Physical Exam         Patient is unresponsive Weak and frail Shallow respirations Abdomen not distended Thin extremities  Vital Signs: BP 128/65 (BP Location: Left Arm)   Pulse 92   Temp 98.2 F (36.8 C) (Oral)   Resp 14   Ht 5' (1.524 m)   Wt 38.6 kg   SpO2 96%   BMI 16.62 kg/m  SpO2: SpO2: 96 % O2 Device: O2 Device: Room Air O2 Flow Rate: O2 Flow Rate (L/min): 1 L/min  Intake/output summary:   Intake/Output Summary (Last 24 hours) at 09/01/2020 1201 Last data filed at 09/01/2020 0600 Gross per 24 hour  Intake 120 ml  Output --  Net 120 ml   LBM: Last BM Date: 08/31/20 Baseline Weight: Weight: 32.7 kg Most recent weight: Weight: 38.6 kg       Palliative Assessment/Data:    Flowsheet Rows     Most Recent Value  Intake Tab  Referral Department Hospitalist  Unit at Time of Referral Med/Surg Unit  Date Notified 08/22/20  Palliative Care Type New Palliative care  Reason for referral Clarify Goals of Care  Date of Admission 2020-08-28  Date first seen by Palliative Care 08/22/20  # of days Palliative  referral response time 0 Day(s)  # of days IP prior to Palliative referral 1  Clinical Assessment  Psychosocial & Spiritual Assessment  Palliative Care Outcomes      Patient Active Problem List   Diagnosis Date Noted  . Altered mental status   . Palliative care by specialist   . Acute metabolic encephalopathy 08/22/2020  . CVA (cerebral vascular accident) (HCC) 08/22/2020  . Pressure injury of skin 08/22/2020  . Advanced care planning/counseling  discussion   . Hypoglycemia   . Dementia without behavioral disturbance (HCC)   . Goals of care, counseling/discussion   . Postoperative anemia due to acute blood loss 11/27/2018  . Hip fracture (HCC) 11/23/2018  . Chronic pain of right knee 03/11/2018  . Pain in right hip 02/10/2018  . Anemia 02/04/2018  . Constipation due to opioid therapy 01/31/2018  . Alzheimer disease (HCC) 01/31/2018  . Malnutrition of moderate degree 01/27/2018  . Fall 01/25/2018  . Blindness 01/25/2018  . Hypokalemia 01/25/2018  . Lytic bone lesion of hip 01/25/2018  . Essential hypertension, benign     Palliative Care Assessment & Plan   Patient Profile:    Assessment: 86 year old lady with hypertension glaucoma advanced dementia, brought to the hospital because of altered mental status, found to have acute stroke, acute metabolic encephalopathy.  Patient also has acute systolic congestive heart failure, also concern for possible malignancy.  Recommendations/Plan: Patient continues in the actively dying process.  [name].  Medication list noted.  She is on opioids to be used on an as-needed basis for pain or shortness of breath.  Call placed and discussed with son Erica Vasquez.  Offered support, offered active listening and empathic presence as much as is possible over the telephone.  [name] states that he understands the patient is seriously ill and has a high likelihood of not surviving this hospitalization.  He  states that he has been told since the last week and a half that the patient is not going to make it.  He states that it is very distressing for him to hear negative conversations such as that the patient is at end-of-life, her body is shutting down.  He states that he wants to keep fighting for her.  Discussed gently about patient's current condition and my frank opinion that she does appear to be actively dying and we gained  consensus on supporting the patient through this process and using medications for comfort, allowing for anticipated hospital death.  All of his questions addressed to the best of my ability.  Goals of Care and Additional Recommendations: Limitations on Scope of Treatment: No Lab Draws  Code Status:    Code Status Orders  (From admission, onward)         Start     Ordered   08/22/20 1912  Do not attempt resuscitation (DNR)  Continuous       Question Answer Comment  In the event of cardiac or respiratory ARREST Do not call a "code blue"   In the event of cardiac or respiratory ARREST Do not perform Intubation, CPR, defibrillation or ACLS   In the event of cardiac or respiratory ARREST Use medication by any route, position, wound care, and other measures to relive pain and suffering. May use oxygen, suction and manual treatment of airway obstruction as needed for comfort.      08/22/20 1911        Code Status History    Date Active Date Inactive Code Status Order ID Comments User Context   08/22/2020 0114 08/22/2020 1911 Full Code [id number]  Lilyan Gilford, DO ED   11/23/2018 0345 11/29/2018 1120 Full Code [id number]  Haydee Monica, MD ED   01/25/2018 2042 01/30/2018 2314 Full Code [id number]  Lorretta Harp, MD ED   Advance Care Planning Activity      Prognosis:  Hours - Days  Discharge Planning: Anticipated Hospital Death  Care plan was discussed with  [facility]  MD, patient's son Erica Vasquez on the phone.   Thank you for allowing the Palliative Medicine Team to  assist in the care of this patient.   Time In: 11 Time Out: 11.35 Total Time 35 Prolonged Time Billed  no       Greater than 50%  of this time was spent counseling and coordinating care related to the above assessment and plan.  Rosalin Hawking, MD  Please contact Palliative Medicine Team phone at [phone] for questions and concerns.

## 2020-09-01 NOTE — Progress Notes (Signed)
PROGRESS NOTE  Erica Vasquez FYB:[id number] DOB: 1930-07-09 DOA: 08/17/2020 PCP: Mirna Mires, MD   LOS: 10 days   Brief Narrative / Interim history: 86 year old female with history of HTN, glaucoma, advanced dementia, presents to the hospital brought by her niece with mental status changes.  Niece reports that patient was not eating and was not talking, she does talk sometimes but is incoherent.  She was just holding her food in her mouth.  She was also found to be hypoglycemic.  Subjective / 24h Interval events: Unresponsive  Assessment & Plan: Principal Problem Active decline, end-of-life care -Patient is actively declining, she remains obtunded.  Expected in hospital death. The son feels a strong desire to try to do everything that can be done for her, continue medical management.  She is a DNR.  Active problems Acute CVA, acute metabolic encephalopathy -MRI done on admission 11/1 showed acute CVA.  Neurology consulted and evaluated patient.  Not appropriate for any further interventional studies  Advanced dementia, goals of care discussions -Palliative following  Hypoglycemia, hypothermia -No evidence of acute infectious process, cortisol and TSH unrevealing.  Likely at the end of her life  Acute systolic CHF -Underwent 2D echo as part of the work-up for CVA which showed an EF of 20-30% with global hypokinesis. Based on imaging it appears to be Takotsubo cardiomyopathy versus wraparound LAD. Cautious with fluids. -Not a candidate for aggressive interventions  Possible malignancy -CT scan shows soft tissue mass in the lower chest wall, there is also concern about hyperdense area in the mid left kidney, also left adnexal masses which may represent large, complex ovarian cyst.  This was known to the family and son tells me that she is known about the breast mass for a while now, did not want further work-up in the past and agrees with just observing for now  Acute PE -Picked  up CT angio of the head and neck, partially visualized.  Initially on anticoagulation however it is unlikely to provide her significant benefit or change her outcome and therefore it has been discontinued.  Hypokalemia -Continue to replete as indicated  Pancytopenia -Likely multifactorial, chronic illness, poor nutrition  Hyperlipidemia -Unable to tolerate oral statins  Pressure Injury 08/22/20 Coccyx Posterior;Mid Stage 3 -  Full thickness tissue loss. Subcutaneous fat may be visible but bone, tendon or muscle are NOT exposed. sloughing skin, black and yellow in color (Active)  08/22/20 1030  Location: Coccyx  Location Orientation: Posterior;Mid  Staging: Stage 3 -  Full thickness tissue loss. Subcutaneous fat may be visible but bone, tendon or muscle are NOT exposed.  Wound Description (Comments): sloughing skin, black and yellow in color  Present on Admission: Yes     Pressure Injury 08/22/20 Buttocks Left Stage 2 -  Partial thickness loss of dermis presenting as a shallow open injury with a red, pink wound bed without slough. skin tear- small (Active)  08/22/20 1030  Location: Buttocks  Location Orientation: Left  Staging: Stage 2 -  Partial thickness loss of dermis presenting as a shallow open injury with a red, pink wound bed without slough.  Wound Description (Comments): skin tear- small  Present on Admission: Yes    Scheduled Meds:  Continuous Infusions: . dextrose 10 mL/hr at 08/29/20 1020   PRN Meds:.acetaminophen **OR** acetaminophen, dextrose, lip balm, LORazepam, methocarbamol, morphine injection, ondansetron **OR** ondansetron (ZOFRAN) IV, senna-docusate  Diet Orders (From admission, onward)    Start     Ordered   08/22/20 1551  Diet  NPO time specified  Diet effective now        08/22/20 1551          DVT prophylaxis:     Code Status: DNR  Family Communication: son at bedside 11/10  Status is: Inpatient  Remains inpatient appropriate  because:Hemodynamically unstable   Dispo: The patient is from: Home              Anticipated d/c is to: Home              Anticipated d/c date is: > 3 days              Patient currently is not medically stable to d/c.  Consultants:  Neurology Palliative  Procedures:  None  Microbiology  Blood cultures-pending  Antimicrobials: Vancomycin 11/1 >> Cefepime 11/1 >>   Objective: Vitals:   08/31/20 2330 09/01/20 0622 09/01/20 0801 09/01/20 1148  BP: 115/75 121/74 123/69 128/65  Pulse: (!) 104 94 96 92  Resp: 19 19 16 14   Temp: 97.7 F (36.5 C) 97.7 F (36.5 C) 97.8 F (36.6 C) 98.2 F (36.8 C)  TempSrc: Axillary Axillary Oral Oral  SpO2: 100% 97% 90% 96%  Weight:      Height:        Intake/Output Summary (Last 24 hours) at 09/01/2020 1245 Last data filed at 09/01/2020 0600 Gross per 24 hour  Intake 120 ml  Output --  Net 120 ml   Filed Weights   08/22/20 2227 08/24/20 0500  Weight: 32.7 kg 38.6 kg    Examination:  Constitutional: Unresponsive but appears comfortable Respiratory: Shallow respirations Cardiovascular: Heart is regular, no edema  Data Reviewed: I have independently reviewed following labs and imaging studies   CBC: No results for input(s): WBC, NEUTROABS, HGB, HCT, MCV, PLT in the last 168 hours. Basic Metabolic Panel: No results for input(s): NA, K, CL, CO2, GLUCOSE, BUN, CREATININE, CALCIUM, MG, PHOS in the last 168 hours. Liver Function Tests: No results for input(s): AST, ALT, ALKPHOS, BILITOT, PROT, ALBUMIN in the last 168 hours. Coagulation Profile: No results for input(s): INR, PROTIME in the last 168 hours. HbA1C: No results for input(s): HGBA1C in the last 72 hours. CBG: Recent Labs  Lab 08/26/20 0753 08/26/20 1209 08/29/20 1915 08/29/20 2319 08/31/20 0726  GLUCAP 132* 131* 221* 95 117*    No results found for this or any previous visit (from the past 240 hour(s)).   Radiology Studies: DG CHEST PORT 1 VIEW  Result  Date: 08/31/2020 CLINICAL DATA:  Dyspnea, hypertension, short of breath EXAM: PORTABLE CHEST 1 VIEW COMPARISON:  07/23/2020 FINDINGS: Single frontal view of the chest demonstrates a stable cardiac silhouette. Continued ectasia of the thoracic aorta. There Is dense consolidation at the lung bases, left greater than right. No effusion or pneumothorax. No acute bony abnormalities. IMPRESSION: 1. Bibasilar lung consolidation new since prior study, favor atelectasis given rapid progression. Electronically Signed   By: 08/23/2020 M.D.   On: 08/31/2020 17:17   13/07/2020, MD, PhD [facility]  Between 7 am - 7 pm I am available, please contact me via Amion or Securechat  Between 7 pm - 7 am I am not available, please contact night coverage MD/APP via Amion

## 2020-09-01 NOTE — Care Management Important Message (Signed)
Important Message  Patient Details IM Letter given to the Patient Name: Erica Vasquez MRN: [id number] Date of Birth: December 07, 1929   Medicare Important Message Given:  Yes     Caren Macadam 09/01/2020, 10:02 AM

## 2020-09-02 DIAGNOSIS — Z515 Encounter for palliative care: Secondary | ICD-10-CM | POA: Diagnosis not present

## 2020-09-02 DIAGNOSIS — G9341 Metabolic encephalopathy: Secondary | ICD-10-CM | POA: Diagnosis not present

## 2020-09-02 DIAGNOSIS — Z7189 Other specified counseling: Secondary | ICD-10-CM | POA: Diagnosis not present

## 2020-09-02 NOTE — Progress Notes (Signed)
Daily Progress Note   Patient Name: Erica Vasquez       Date: 09/02/2020 DOB: 25-Oct-1929  Age: 86 y.o. MRN#: [id number] Attending Physician: Leatha Gilding, MD Primary Care Physician: Mirna Mires, MD Admit Date: 07/26/2020  Reason for Consultation/Follow-up: Establishing goals of care  Subjective: Frail weak lady Not awake not alert Actively dying.    Length of Stay: 11  Current Medications: Scheduled Meds:    Continuous Infusions: . dextrose 10 mL/hr at 09/01/20 1500    PRN Meds: acetaminophen **OR** acetaminophen, dextrose, lip balm, LORazepam, methocarbamol, morphine injection, ondansetron **OR** ondansetron (ZOFRAN) IV, senna-docusate  Physical Exam         Patient is unresponsive Weak and frail Shallow respirations Abdomen not distended Thin extremities  Vital Signs: BP 132/73 (BP Location: Left Arm)   Pulse 85   Temp (!) 97.3 F (36.3 C) (Axillary)   Resp 14   Ht 5' (1.524 m)   Wt 38.6 kg   SpO2 100% Comment: forehead pulse ox sensor   BMI 16.62 kg/m  SpO2: SpO2: 100 % (forehead pulse ox sensor ) O2 Device: O2 Device: Nasal Cannula O2 Flow Rate: O2 Flow Rate (L/min): 4.5 L/min  Intake/output summary:   Intake/Output Summary (Last 24 hours) at 09/02/2020 1331 Last data filed at 09/02/2020 0500 Gross per 24 hour  Intake 189.97 ml  Output --  Net 189.97 ml   LBM: Last BM Date: 09/02/20 Baseline Weight: Weight: 32.7 kg Most recent weight: Weight: 38.6 kg       Palliative Assessment/Data:    Flowsheet Rows     Most Recent Value  Intake Tab  Referral Department Hospitalist  Unit at Time of Referral Med/Surg Unit  Date Notified 08/22/20  Palliative Care Type New Palliative care  Reason for referral Clarify Goals of Care  Date of  Admission 08/12/2020  Date first seen by Palliative Care 08/22/20  # of days Palliative referral response time 0 Day(s)  # of days IP prior to Palliative referral 1  Clinical Assessment  Psychosocial & Spiritual Assessment  Palliative Care Outcomes      Patient Active Problem List   Diagnosis Date Noted  . Altered mental status   . Palliative care by specialist   . Acute metabolic encephalopathy 08/22/2020  . CVA (cerebral vascular accident) (HCC) 08/22/2020  .  Pressure injury of skin 08/22/2020  . Advanced care planning/counseling discussion   . Hypoglycemia   . Dementia without behavioral disturbance (HCC)   . Goals of care, counseling/discussion   . Postoperative anemia due to acute blood loss 11/27/2018  . Hip fracture (HCC) 11/23/2018  . Chronic pain of right knee 03/11/2018  . Pain in right hip 02/10/2018  . Anemia 02/04/2018  . Constipation due to opioid therapy 01/31/2018  . Alzheimer disease (HCC) 01/31/2018  . Malnutrition of moderate degree 01/27/2018  . Fall 01/25/2018  . Blindness 01/25/2018  . Hypokalemia 01/25/2018  . Lytic bone lesion of hip 01/25/2018  . Essential hypertension, benign     Palliative Care Assessment & Plan   Patient Profile:    Assessment: 86 year old lady with hypertension glaucoma advanced dementia, brought to the hospital because of altered mental status, found to have acute stroke, acute metabolic encephalopathy.  Patient also has acute systolic congestive heart failure, also concern for possible malignancy.  Recommendations/Plan: Patient continues in the actively dying process.  [name].  Medication list noted.  She is on opioids to be used on an as-needed basis for pain or shortness of breath.  Discussed with son at bedside Erica Vasquez.  Offered support, offered active listening and empathic presence and discussed about end of life signs and symptoms.    Goals of Care and Additional  Recommendations: Limitations on Scope of Treatment: No Lab Draws  Code Status:    Code Status Orders  (From admission, onward)         Start     Ordered   08/22/20 1912  Do not attempt resuscitation (DNR)  Continuous       Question Answer Comment  In the event of cardiac or respiratory ARREST Do not call a "code blue"   In the event of cardiac or respiratory ARREST Do not perform Intubation, CPR, defibrillation or ACLS   In the event of cardiac or respiratory ARREST Use medication by any route, position, wound care, and other measures to relive pain and suffering. May use oxygen, suction and manual treatment of airway obstruction as needed for comfort.      08/22/20 1911        Code Status History    Date Active Date Inactive Code Status Order ID Comments User Context   08/22/2020 0114 08/22/2020 1911 Full Code [id number]  Lilyan Gilford, DO ED   11/23/2018 0345 11/29/2018 1120 Full Code [id number]  Haydee Monica, MD ED   01/25/2018 2042 01/30/2018 2314 Full Code [id number]  Lorretta Harp, MD ED   Advance Care Planning Activity      Prognosis:  Hours - Days  Discharge Planning: Anticipated Hospital Death  Care plan was discussed with    patient's son Erica Vasquez at bedside.   Thank you for allowing the Palliative Medicine Team to assist in the care of this patient.   Time In: 11 Time Out: 11.25 Total Time 25 Prolonged Time Billed  no       Greater than 50%  of this time was spent counseling and coordinating care related to the above assessment and plan.  Rosalin Hawking, MD  Please contact Palliative Medicine Team phone at [phone] for questions and concerns.

## 2020-09-02 NOTE — Progress Notes (Signed)
PROGRESS NOTE  Erica Vasquez XBW:[id number] DOB: 24-Sep-1930 DOA: 2020-08-29 PCP: Mirna Mires, MD   LOS: 11 days   Brief Narrative / Interim history: 86 year old female with history of HTN, glaucoma, advanced dementia, presents to the hospital brought by her niece with mental status changes.  Niece reports that patient was not eating and was not talking, she does talk sometimes but is incoherent.  She was just holding her food in her mouth.  She was also found to be hypoglycemic.  Subjective / 24h Interval events: No changes, unresponsive  Assessment & Plan: Principal Problem Active decline, end-of-life care -Patient is actively declining, she remains obtunded.  Expected in hospital death. The son feels a strong desire to try to do everything that can be done for her, however her condition is not reversible.  She is a DNR.  Active problems Acute CVA, acute metabolic encephalopathy -MRI done on admission 11/1 showed acute CVA.  Neurology consulted and evaluated patient.  Not appropriate for any further interventional studies  Advanced dementia, goals of care discussions -Palliative following  Hypoglycemia, hypothermia -No evidence of acute infectious process, cortisol and TSH unrevealing.  Likely at the end of her life  Acute systolic CHF -Underwent 2D echo as part of the work-up for CVA which showed an EF of 20-30% with global hypokinesis. Based on imaging it appears to be Takotsubo cardiomyopathy versus wraparound LAD. Cautious with fluids. -Not a candidate for aggressive interventions  Possible malignancy -CT scan shows soft tissue mass in the lower chest wall, there is also concern about hyperdense area in the mid left kidney, also left adnexal masses which may represent large, complex ovarian cyst.  This was known to the family and son tells me that she is known about the breast mass for a while now, did not want further work-up in the past and agrees with just observing for  now  Acute PE -Picked up CT angio of the head and neck, partially visualized.  Initially on anticoagulation however it is unlikely to provide her significant benefit or change her outcome and therefore it has been discontinued.  Hypokalemia -Continue to replete as indicated  Pancytopenia -Likely multifactorial, chronic illness, poor nutrition  Hyperlipidemia -Unable to tolerate oral statins  Pressure Injury 08/22/20 Coccyx Posterior;Mid Stage 3 -  Full thickness tissue loss. Subcutaneous fat may be visible but bone, tendon or muscle are NOT exposed. sloughing skin, black and yellow in color (Active)  08/22/20 1030  Location: Coccyx  Location Orientation: Posterior;Mid  Staging: Stage 3 -  Full thickness tissue loss. Subcutaneous fat may be visible but bone, tendon or muscle are NOT exposed.  Wound Description (Comments): sloughing skin, black and yellow in color  Present on Admission: Yes     Pressure Injury 08/22/20 Buttocks Left Stage 2 -  Partial thickness loss of dermis presenting as a shallow open injury with a red, pink wound bed without slough. skin tear- small (Active)  08/22/20 1030  Location: Buttocks  Location Orientation: Left  Staging: Stage 2 -  Partial thickness loss of dermis presenting as a shallow open injury with a red, pink wound bed without slough.  Wound Description (Comments): skin tear- small  Present on Admission: Yes    Scheduled Meds:  Continuous Infusions: . dextrose 10 mL/hr at 09/01/20 1500   PRN Meds:.acetaminophen **OR** acetaminophen, dextrose, lip balm, LORazepam, methocarbamol, morphine injection, ondansetron **OR** ondansetron (ZOFRAN) IV, senna-docusate  Diet Orders (From admission, onward)    Start     Ordered  08/22/20 1551  Diet NPO time specified  Diet effective now        08/22/20 1551          DVT prophylaxis:     Code Status: DNR  Family Communication: no family at bedside   Status is: Inpatient  Remains inpatient  appropriate because:Hemodynamically unstable  Dispo: The patient is from: Home              Anticipated d/c is to: Home              Anticipated d/c date is: > 3 days              Patient currently is not medically stable to d/c.  Consultants:  Neurology Palliative  Procedures:  None  Microbiology  Blood cultures-pending  Antimicrobials: Vancomycin 11/1 >> Cefepime 11/1 >>   Objective: Vitals:   09/01/20 1958 09/01/20 2336 09/02/20 0408 09/02/20 0805  BP: 115/69 138/69 120/71 136/68  Pulse: 85 95 93 87  Resp: 15 15 14 14   Temp: 97.6 F (36.4 C) 97.7 F (36.5 C) 97.7 F (36.5 C) 97.6 F (36.4 C)  TempSrc: Axillary Axillary Axillary Oral  SpO2: 100% 96% (!) 88% (!) 66%  Weight:      Height:        Intake/Output Summary (Last 24 hours) at 09/02/2020 1001 Last data filed at 09/02/2020 0500 Gross per 24 hour  Intake 189.97 ml  Output --  Net 189.97 ml   Filed Weights   08/22/20 2227 08/24/20 0500  Weight: 32.7 kg 38.6 kg    Examination:  Constitutional: unresponsive  Data Reviewed: I have independently reviewed following labs and imaging studies   CBC: No results for input(s): WBC, NEUTROABS, HGB, HCT, MCV, PLT in the last 168 hours. Basic Metabolic Panel: No results for input(s): NA, K, CL, CO2, GLUCOSE, BUN, CREATININE, CALCIUM, MG, PHOS in the last 168 hours. Liver Function Tests: No results for input(s): AST, ALT, ALKPHOS, BILITOT, PROT, ALBUMIN in the last 168 hours. Coagulation Profile: No results for input(s): INR, PROTIME in the last 168 hours. HbA1C: No results for input(s): HGBA1C in the last 72 hours. CBG: Recent Labs  Lab 08/26/20 1209 08/29/20 1915 08/29/20 2319 08/31/20 0726  GLUCAP 131* 221* 95 117*    No results found for this or any previous visit (from the past 240 hour(s)).   Radiology Studies: No results found. 13/10/21, MD, PhD [facility]  Between 7 am - 7 pm I am available, please contact me via Amion  or Securechat  Between 7 pm - 7 am I am not available, please contact night coverage MD/APP via Amion

## 2020-09-03 DIAGNOSIS — Z7189 Other specified counseling: Secondary | ICD-10-CM | POA: Diagnosis not present

## 2020-09-03 DIAGNOSIS — Z515 Encounter for palliative care: Secondary | ICD-10-CM | POA: Diagnosis not present

## 2020-09-03 DIAGNOSIS — G9341 Metabolic encephalopathy: Secondary | ICD-10-CM | POA: Diagnosis not present

## 2020-09-03 NOTE — Progress Notes (Signed)
PROGRESS NOTE  Erica Vasquez:[id number] DOB: November 19, 1929 DOA: 08/20/2020 PCP: Mirna Mires, MD   LOS: 12 days   Brief Narrative / Interim history: 86 year old female with history of HTN, glaucoma, advanced dementia, presents to the hospital brought by her niece with mental status changes.  Niece reports that patient was not eating and was not talking, she does talk sometimes but is incoherent.  She was just holding her food in her mouth.  She was also found to be hypoglycemic.  Subjective / 24h Interval events: No changes, unresponsive  Assessment & Plan: Principal Problem Active decline, end-of-life care -Patient is actively declining, she remains obtunded.  Expected in hospital death. The son feels a strong desire to try to do everything that can be done for her, however her condition is not reversible.  While he understands he does not wish for comfort care currently.  She is a DNR.  Active problems Acute CVA, acute metabolic encephalopathy -MRI done on admission 11/1 showed acute CVA.  Neurology consulted and evaluated patient.  Not appropriate for any further interventional studies  Advanced dementia, goals of care discussions -Palliative following  Hypoglycemia, hypothermia -No evidence of acute infectious process, cortisol and TSH unrevealing.  Likely at the end of her life  Acute systolic CHF -Underwent 2D echo as part of the work-up for CVA which showed an EF of 20-30% with global hypokinesis. Based on imaging it appears to be Takotsubo cardiomyopathy versus wraparound LAD. Cautious with fluids. -Not a candidate for aggressive interventions  Possible malignancy -CT scan shows soft tissue mass in the lower chest wall, there is also concern about hyperdense area in the mid left kidney, also left adnexal masses which may represent large, complex ovarian cyst.  This was known to the family and son tells me that she is known about the breast mass for a while now, did not  want further work-up in the past and agrees with just observing for now  Acute PE -Picked up CT angio of the head and neck, partially visualized.  Initially on anticoagulation however it is unlikely to provide her significant benefit or change her outcome and therefore it has been discontinued.  Hypokalemia -Continue to replete as indicated  Pancytopenia -Likely multifactorial, chronic illness, poor nutrition  Hyperlipidemia -Unable to tolerate oral statins  Pressure Injury 08/22/20 Coccyx Posterior;Mid Stage 3 -  Full thickness tissue loss. Subcutaneous fat may be visible but bone, tendon or muscle are NOT exposed. sloughing skin, black and yellow in color (Active)  08/22/20 1030  Location: Coccyx  Location Orientation: Posterior;Mid  Staging: Stage 3 -  Full thickness tissue loss. Subcutaneous fat may be visible but bone, tendon or muscle are NOT exposed.  Wound Description (Comments): sloughing skin, black and yellow in color  Present on Admission: Yes     Pressure Injury 08/22/20 Buttocks Left Stage 2 -  Partial thickness loss of dermis presenting as a shallow open injury with a red, pink wound bed without slough. skin tear- small (Active)  08/22/20 1030  Location: Buttocks  Location Orientation: Left  Staging: Stage 2 -  Partial thickness loss of dermis presenting as a shallow open injury with a red, pink wound bed without slough.  Wound Description (Comments): skin tear- small  Present on Admission: Yes    Scheduled Meds:  Continuous Infusions: . dextrose 10 mL/hr at 09/01/20 1500   PRN Meds:.acetaminophen **OR** acetaminophen, dextrose, lip balm, LORazepam, methocarbamol, morphine injection, ondansetron **OR** ondansetron (ZOFRAN) IV, senna-docusate  Diet Orders (From  admission, onward)    Start     Ordered   08/22/20 1551  Diet NPO time specified  Diet effective now        08/22/20 1551          DVT prophylaxis:     Code Status: DNR  Family Communication: No  family at bedside  Status is: Inpatient  Remains inpatient appropriate because:Hemodynamically unstable  Dispo: The patient is from: Home              Anticipated d/c is to: Home              Anticipated d/c date is: > 3 days              Patient currently is not medically stable to d/c.  Consultants:  Neurology Palliative  Procedures:  None  Microbiology  Blood cultures-pending  Antimicrobials: None currently   Objective: Vitals:   09/03/20 0157 09/03/20 0252 09/03/20 0658 09/03/20 1054  BP: 117/69 123/72 120/71 (!) 53/42  Pulse: 86 89 80 72  Resp: 12 10 14 12   Temp: (!) 95.2 F (35.1 C) 98.8 F (37.1 C) (!) 95.9 F (35.5 C)   TempSrc: Rectal Rectal    SpO2: (!) 62% 100% (!) 82%   Weight:      Height:        Intake/Output Summary (Last 24 hours) at 09/03/2020 1107 Last data filed at 09/02/2020 2241 Gross per 24 hour  Intake 99.97 ml  Output --  Net 99.97 ml   Filed Weights   08/22/20 2227 08/24/20 0500  Weight: 32.7 kg 38.6 kg    Examination:  Constitutional: Remains unresponsive  Data Reviewed: I have independently reviewed following labs and imaging studies   CBC: No results for input(s): WBC, NEUTROABS, HGB, HCT, MCV, PLT in the last 168 hours. Basic Metabolic Panel: No results for input(s): NA, K, CL, CO2, GLUCOSE, BUN, CREATININE, CALCIUM, MG, PHOS in the last 168 hours. Liver Function Tests: No results for input(s): AST, ALT, ALKPHOS, BILITOT, PROT, ALBUMIN in the last 168 hours. Coagulation Profile: No results for input(s): INR, PROTIME in the last 168 hours. HbA1C: No results for input(s): HGBA1C in the last 72 hours. CBG: Recent Labs  Lab 08/29/20 1915 08/29/20 2319 08/31/20 0726  GLUCAP 221* 95 117*    No results found for this or any previous visit (from the past 240 hour(s)).   Radiology Studies: No results found. 13/10/21, MD, PhD [facility]  Between 7 am - 7 pm I am available, please contact me via Amion  or Securechat  Between 7 pm - 7 am I am not available, please contact night coverage MD/APP via Amion

## 2020-09-03 NOTE — Progress Notes (Signed)
Daily Progress Note   Patient Name: Erica Vasquez       Date: 09/03/2020 DOB: [birth year]  Age: 86 y.o. MRN#: [id number] Attending Physician: Erica Gilding, MD Primary Care Physician: Erica Mires, MD Admit Date: Sep 06, 2020  Reason for Consultation/Follow-up: Establishing goals of care  Subjective: Frail weak lady Not awake not alert Actively dying.    Length of Stay: 12  Current Medications: Scheduled Meds:    Continuous Infusions: . dextrose 10 mL/hr at 09/01/20 1500    PRN Meds: acetaminophen **OR** acetaminophen, dextrose, lip balm, LORazepam, methocarbamol, morphine injection, ondansetron **OR** ondansetron (ZOFRAN) IV, senna-docusate  Physical Exam         Patient is unresponsive Weak and frail Shallow respirations Abdomen not distended Thin extremities  Vital Signs: BP (!) 53/42 (BP Location: Left Wrist) Comment: Erica Vasquez notified of low BP; charge notified of vitals   Pulse 72 Comment: vital sign machine isnt picking up O2 or pulse rate;RN aware  Temp (!) 95.9 F (35.5 C)   Resp 12   Ht 5' (1.524 m)   Wt 38.6 kg   SpO2 (!) 82%   BMI 16.62 kg/m  SpO2: SpO2:  (vital sign machine isnt picking up O2 or pulse rate;RN aware) O2 Device: O2 Device: Other (Comment) (vital sign machine isnt picking up O2 or pulse rate;RN aware) O2 Flow Rate: O2 Flow Rate (L/min): 5 L/min  Intake/output summary:   Intake/Output Summary (Last 24 hours) at 09/03/2020 1304 Last data filed at 09/02/2020 2241 Gross per 24 hour  Intake 99.97 ml  Output --  Net 99.97 ml   LBM: Last BM Date: 09/02/20 Baseline Weight: Weight: 32.7 kg Most recent weight: Weight: 38.6 kg       Palliative Assessment/Data:    Flowsheet Rows     Most Recent Value  Intake Tab  Referral Department  Hospitalist  Unit at Time of Referral Med/Surg Unit  Date Notified 08/22/20  Palliative Care Type New Palliative care  Reason for referral Clarify Goals of Care  Date of Admission 09-06-20  Date first seen by Palliative Care 08/22/20  # of days Palliative referral response time 0 Day(s)  # of days IP prior to Palliative referral 1  Clinical Assessment  Psychosocial & Spiritual Assessment  Palliative Care Outcomes      Patient Active Problem List  Diagnosis Date Noted  . Altered mental status   . Palliative care by specialist   . Acute metabolic encephalopathy 08/22/2020  . CVA (cerebral vascular accident) (HCC) 08/22/2020  . Pressure injury of skin 08/22/2020  . Advanced care planning/counseling discussion   . Hypoglycemia   . Dementia without behavioral disturbance (HCC)   . Goals of care, counseling/discussion   . Postoperative anemia due to acute blood loss 11/27/2018  . Hip fracture (HCC) 11/23/2018  . Chronic pain of right knee 03/11/2018  . Pain in right hip 02/10/2018  . Anemia 02/04/2018  . Constipation due to opioid therapy 01/31/2018  . Alzheimer disease (HCC) 01/31/2018  . Malnutrition of moderate degree 01/27/2018  . Fall 01/25/2018  . Blindness 01/25/2018  . Hypokalemia 01/25/2018  . Lytic bone lesion of hip 01/25/2018  . Essential hypertension, benign     Palliative Care Assessment & Plan   Patient Profile:    Assessment: 86 year old lady with hypertension glaucoma advanced dementia, brought to the hospital because of altered mental status, found to have acute stroke, acute metabolic encephalopathy.  Patient also has acute systolic congestive heart failure, also concern for possible malignancy.  Recommendations/Plan: Patient continues in the actively dying process.  [name], however, she has more of a "sunken" appearance to her face this morning.  Medication list noted.  She is on opioids to be used on an as-needed basis  for pain or shortness of breath.  Discussed with son at bedside Erica Vasquez.  Offered support, offered active listening and empathic presence and discussed about end of life signs and symptoms.  He describes that this is a very difficult transition period for him.  He states that that he has been the patient's primary caregiver for a number of years.  He states he honestly does not know what he will do without her.  He showed me pictures of the patient from a few months ago on his phone. All received and discussed with bedside RN, I appreciate all of his efforts in continuing goals of care discussions with the patient's son with regards to establishing full scope of comfort measures and with regards to allowing a peaceful passing.  Goals of Care and Additional Recommendations: Limitations on Scope of Treatment: No Lab Draws  Code Status:    Code Status Orders  (From admission, onward)         Start     Ordered   08/22/20 1912  Do not attempt resuscitation (DNR)  Continuous       Question Answer Comment  In the event of cardiac or respiratory ARREST Do not call a "code blue"   In the event of cardiac or respiratory ARREST Do not perform Intubation, CPR, defibrillation or ACLS   In the event of cardiac or respiratory ARREST Use medication by any route, position, wound care, and other measures to relive pain and suffering. May use oxygen, suction and manual treatment of airway obstruction as needed for comfort.      08/22/20 1911        Code Status History    Date Active Date Inactive Code Status Order ID Comments User Context   08/22/2020 0114 08/22/2020 1911 Full Code [id number]  Erica Gilford, DO ED   11/23/2018 0345 11/29/2018 1120 Full Code [id number]  Erica Monica, MD ED   01/25/2018 2042 01/30/2018 2314 Full Code [id number]  Erica Harp, MD ED   Advance Care Planning Activity      Prognosis:  Hours - Days  Discharge Planning: Anticipated Hospital Death  Care plan was  discussed with    patient's son Erica Vasquez at bedside.  Discussed with bedside RN. Thank you for allowing the Palliative Medicine Team to assist in the care of this patient.   Time In: 11 Time Out: 11.35 Total Time 35 Prolonged Time Billed  no       Greater than 50%  of this time was spent counseling and coordinating care related to the above assessment and plan.  Erica Hawking, MD  Please contact Palliative Medicine Team phone at [phone] for questions and concerns.

## 2020-09-04 DIAGNOSIS — G9341 Metabolic encephalopathy: Secondary | ICD-10-CM | POA: Diagnosis not present

## 2020-09-04 DIAGNOSIS — F0280 Dementia in other diseases classified elsewhere without behavioral disturbance: Secondary | ICD-10-CM | POA: Diagnosis not present

## 2020-09-04 DIAGNOSIS — G309 Alzheimer's disease, unspecified: Secondary | ICD-10-CM | POA: Diagnosis not present

## 2020-09-04 DIAGNOSIS — Z515 Encounter for palliative care: Secondary | ICD-10-CM | POA: Diagnosis not present

## 2020-09-04 DIAGNOSIS — Z7189 Other specified counseling: Secondary | ICD-10-CM | POA: Diagnosis not present

## 2020-09-21 NOTE — Progress Notes (Signed)
Called into room by [facility] NT to find patient with no visible chest rise and upon auscultation no heart or lung sounds were heard.  Verified with Joaquin Bend RN and patient was pronounced deceased.  Dr Elvera Lennox and Dr. Linna Darner notified and will contact family.  Post mortem care provided to patient at this time.

## 2020-09-21 NOTE — Progress Notes (Signed)
Patient transported to the morgue.

## 2020-09-21 NOTE — Progress Notes (Signed)
PROGRESS NOTE  Erica Vasquez OJJ:[id number] DOB: 11-Oct-1930 DOA: 07/26/2020 PCP: Mirna Mires, MD   LOS: 13 days   Brief Narrative / Interim history: 86 year old female with history of HTN, glaucoma, advanced dementia, presents to the hospital brought by her niece with mental status changes.  Niece reports that patient was not eating and was not talking, she does talk sometimes but is incoherent.  She was just holding her food in her mouth.  She was also found to be hypoglycemic.  Subjective / 24h Interval events: Unresponsive  Assessment & Plan: Principal Problem Active decline, end-of-life care -Patient is actively declining, she remains obtunded.  Expected in hospital death. The son feels a strong desire to try to do everything that can be done for her, however her condition is not reversible.  While he understands he does not wish for comfort care currently.  She is a DNR.  Active problems Acute CVA, acute metabolic encephalopathy -MRI done on admission 11/1 showed acute CVA.  Neurology consulted and evaluated patient.  Not appropriate for any further interventional studies  Advanced dementia, goals of care discussions -Palliative following  Hypoglycemia, hypothermia -No evidence of acute infectious process, cortisol and TSH unrevealing.  Likely at the end of her life  Acute systolic CHF -Underwent 2D echo as part of the work-up for CVA which showed an EF of 20-30% with global hypokinesis. Based on imaging it appears to be Takotsubo cardiomyopathy versus wraparound LAD. Cautious with fluids. -Not a candidate for aggressive interventions  Possible malignancy -CT scan shows soft tissue mass in the lower chest wall, there is also concern about hyperdense area in the mid left kidney, also left adnexal masses which may represent large, complex ovarian cyst.  This was known to the family and son tells me that she is known about the breast mass for a while now, did not want further  work-up in the past and agrees with just observing for now  Acute PE -Picked up CT angio of the head and neck, partially visualized.  Initially on anticoagulation however it is unlikely to provide her significant benefit or change her outcome and therefore it has been discontinued.  Hypokalemia -Continue to replete as indicated  Pancytopenia -Likely multifactorial, chronic illness, poor nutrition  Hyperlipidemia -Unable to tolerate oral statins  Pressure Injury 08/22/20 Coccyx Posterior;Mid Stage 3 -  Full thickness tissue loss. Subcutaneous fat may be visible but bone, tendon or muscle are NOT exposed. sloughing skin, black and yellow in color (Active)  08/22/20 1030  Location: Coccyx  Location Orientation: Posterior;Mid  Staging: Stage 3 -  Full thickness tissue loss. Subcutaneous fat may be visible but bone, tendon or muscle are NOT exposed.  Wound Description (Comments): sloughing skin, black and yellow in color  Present on Admission: Yes     Pressure Injury 08/22/20 Buttocks Left Stage 2 -  Partial thickness loss of dermis presenting as a shallow open injury with a red, pink wound bed without slough. skin tear- small (Active)  08/22/20 1030  Location: Buttocks  Location Orientation: Left  Staging: Stage 2 -  Partial thickness loss of dermis presenting as a shallow open injury with a red, pink wound bed without slough.  Wound Description (Comments): skin tear- small  Present on Admission: Yes    Scheduled Meds:  Continuous Infusions: . dextrose 10 mL/hr at 09/01/20 1500   PRN Meds:.acetaminophen **OR** acetaminophen, dextrose, lip balm, LORazepam, methocarbamol, morphine injection, ondansetron **OR** ondansetron (ZOFRAN) IV, senna-docusate  Diet Orders (From admission, onward)  Start     Ordered   08/22/20 1551  Diet NPO time specified  Diet effective now        08/22/20 1551          DVT prophylaxis:     Code Status: DNR  Family Communication: No family at  bedside  Status is: Inpatient  Remains inpatient appropriate because:Hemodynamically unstable  Dispo: The patient is from: Home              Anticipated d/c is to: Home              Anticipated d/c date is: > 3 days              Patient currently is not medically stable to d/c.  Consultants:  Neurology Palliative  Procedures:  None  Microbiology  Blood cultures-pending  Antimicrobials: None currently   Objective: Vitals:   09/03/20 2304 September 26, 2020 0212 September 26, 2020 0632 2020/09/26 0754  BP:  91/62 110/69 (!) 89/55  Pulse: 79 84 80 76  Resp:  17 16 14   Temp: (!) 97.5 F (36.4 C) 97.7 F (36.5 C)    TempSrc: Rectal Rectal    SpO2: 92% 94% 96% (!) 76%  Weight:      Height:        Intake/Output Summary (Last 24 hours) at September 26, 2020 1053 Last data filed at 09/03/2020 1906 Gross per 24 hour  Intake 0 ml  Output --  Net 0 ml   Filed Weights   08/22/20 2227 08/24/20 0500  Weight: 32.7 kg 38.6 kg    Examination:  Constitutional: Unresponsive  Data Reviewed: I have independently reviewed following labs and imaging studies   CBC: No results for input(s): WBC, NEUTROABS, HGB, HCT, MCV, PLT in the last 168 hours. Basic Metabolic Panel: No results for input(s): NA, K, CL, CO2, GLUCOSE, BUN, CREATININE, CALCIUM, MG, PHOS in the last 168 hours. Liver Function Tests: No results for input(s): AST, ALT, ALKPHOS, BILITOT, PROT, ALBUMIN in the last 168 hours. Coagulation Profile: No results for input(s): INR, PROTIME in the last 168 hours. HbA1C: No results for input(s): HGBA1C in the last 72 hours. CBG: Recent Labs  Lab 08/29/20 1915 08/29/20 2319 08/31/20 0726  GLUCAP 221* 95 117*    No results found for this or any previous visit (from the past 240 hour(s)).   Radiology Studies: No results found. 13/10/21, MD, PhD [facility]  Between 7 am - 7 pm I am available, please contact me via Amion or Securechat  Between 7 pm - 7 am I am not available,  please contact night coverage MD/APP via Amion

## 2020-09-21 NOTE — Death Summary Note (Signed)
Death Summary  Seward CarolMontez P Aranas ZOX:[id number] DOB: February 11, 1930 DOA: Jan 26, 2020  PCP: Mirna MiresHill, [name], MD  Admit date: Jan 26, 2020 Date of Death: 09/01/2020 Time of Death: 2:34 pm Notification: Mirna MiresHill, [name], MD notified of death of 09/08/2020   History of present illness:  86 year old female with history of HTN, glaucoma, advanced dementia, presents to the hospital brought by her niece with mental status changes. Niece reports that patient was not eating and was not talking, she does talk sometimes but is incoherent. She was just holding her food in her mouth. She was also found to be hypoglycemic.  Final Diagnoses:  Principal Problem Active decline, end-of-life care -patient with advanced dementia, acute stroke, acute systolic CHF, acute PE, severe malnutrition, she was eventually transitioned to comfort care and passed away 11/14  Active problems Acute CVA, acute metabolic encephalopathy Advanced dementia Hypoglycemia, hypothermia Acute systolic CHF, takotsubo cardiomyopathy versus wrap around LAD Probable malignancy -family did not want further work-up in the past and agreed with observation Acute PE Hypokalemia Pancytopenia Hyperlipidemia Anemia of chronic disease Thrombocytopenia Hypophosphatemia Hypocalcemia Lactic acidosis   The results of significant diagnostics from this hospitalization (including imaging, microbiology, ancillary and laboratory) are listed below for reference.    Significant Diagnostic Studies: CT ANGIO HEAD W OR WO CONTRAST  Result Date: 08/22/2020 CLINICAL DATA:  Stroke/TIA, assess intracranial artery. Mental status change with advanced dementia. EXAM: CT HEAD WITHOUT CONTRAST CT ANGIOGRAPHY OF THE HEAD AND NECK TECHNIQUE: Contiguous axial images were obtained from the base of the skull through the vertex without intravenous contrast. Multidetector CT imaging of the head and neck was performed using the standard protocol during bolus administration  of intravenous contrast. Multiplanar CT image reconstructions and MIPs were obtained to evaluate the vascular anatomy. Carotid stenosis measurements (when applicable) are obtained utilizing NASCET criteria, using the distal internal carotid diameter as the denominator. CONTRAST:  100mL OMNIPAQUE IOHEXOL 350 MG/ML SOLN COMPARISON:  Same day MRI.  MRA 10/10/2009 FINDINGS: CT HEAD Brain: Small infarcts are better characterized on same day MRI. No evidence of acute hemorrhage, hydrocephalus, extra-axial collection, mass lesion or abnormal mass effect. Chronic microvascular ischemic change and diffuse cerebral atrophy with ex vacuo ventricular dilation Vascular: Calcific atherosclerosis. Skull: No acute fracture. Sinuses/Orbits: No acute findings. CTA NECK Aortic arch: Great vessel origins are patent. Right carotid system: Tortuous, retropharyngeal course. No evidence of significant stenosis. Left carotid system: Tortuous, retropharyngeal course. There is moderate short segment narrowing of the internal carotid artery just proximal to the skull base. Vertebral arteries:Left dominant. No evidence of significant stenosis or occlusion. Tortuous. Skeleton: Multilevel severe degenerative disc disease of the cervical spine. Other neck: Cachectic with very little fat within the neck. This limits evaluation without obvious mass or adenopathy. Upper Chest: Filling defect within the left pulmonary artery extending into the partially visualized left lobar pulmonary arteries, compatible with pulmonary embolism. CTA HEAD Anterior circulation: Predominately calcific atherosclerosis of the left cavernous carotid artery without evidence of greater than 50% stenosis. Severe narrowing of a proximal right M2 MCA branch (see series 10, image 124) with multifocal moderate narrowing of the more distal M2 MCA branches. Moderate narrowing of a proximal left M2 MCA branch (see series 10, image 115). Small (1-2 mm) posteriorly directed aneurysm  arising from the paraclinoid left ICA (series 10, image 119). Posterior circulation: Dysplastic appearance of the proximal basilar artery, which is widely patent. Multifocal severe stenosis of bilateral P1 and P2 PCAs. Venous sinuses: Poorly visualized given arterial timing. Within this limitation, no at evidence  of dural sinus thrombosis. IMPRESSION: 1. Filling defect within the left pulmonary artery extending into the partially visualized left lobar pulmonary arteries, compatible with pulmonary embolism. Recommend CTA of the chest when renal function allows to further evaluate and to also exclude hilar malignancy. 2. Multifocal severe stenosis of bilateral P1 and P2 PCAs. 3. Severe stenosis of a proximal right M2 MCA branch with multifocal moderate more distal M2 MCA narrowing. 4. Moderate narrowing of a proximal left M2 MCA branch. 5. Moderate short-segment narrowing of the left internal carotid artery at the skull base. 6. 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 Recommend follow-up CTA in 1 year to ensure stability. 7. Known small infarcts better characterized on same day MRI. Critical findings were called by telephone at the time of interpretation on 08/22/2020 at 1:56 pm to provider [facility] , who verbally acknowledged these results. Additional findings were discussed with Anjel Perfetti at 2:08 p.m. via telephone. Electronically Signed   By: Feliberto Harts MD   On: 08/22/2020 14:15   CT Head Wo Contrast  Result Date: 08/20/2020 CLINICAL DATA:  Delirium EXAM: CT HEAD WITHOUT CONTRAST TECHNIQUE: Contiguous axial images were obtained from the base of the skull through the vertex without intravenous contrast. COMPARISON:  None. FINDINGS: Brain: Advanced atrophy and chronic small vessel disease throughout the deep white matter. Associated ventriculomegaly. No acute intracranial abnormality. Specifically, no hemorrhage, hydrocephalus, mass lesion, acute  infarction, or significant intracranial injury. Vascular: No hyperdense vessel or unexpected calcification. Skull: No acute calvarial abnormality. Sinuses/Orbits: Visualized paranasal sinuses and mastoids clear. Orbital soft tissues unremarkable. Other: None IMPRESSION: Atrophy, chronic microvascular disease. No acute intracranial abnormality. Electronically Signed   By: Charlett Nose M.D.   On: 08/17/2020 22:40   CT ANGIO NECK W OR WO CONTRAST  Result Date: 08/22/2020 CLINICAL DATA:  Stroke/TIA, assess intracranial artery. Mental status change with advanced dementia. EXAM: CT HEAD WITHOUT CONTRAST CT ANGIOGRAPHY OF THE HEAD AND NECK TECHNIQUE: Contiguous axial images were obtained from the base of the skull through the vertex without intravenous contrast. Multidetector CT imaging of the head and neck was performed using the standard protocol during bolus administration of intravenous contrast. Multiplanar CT image reconstructions and MIPs were obtained to evaluate the vascular anatomy. Carotid stenosis measurements (when applicable) are obtained utilizing NASCET criteria, using the distal internal carotid diameter as the denominator. CONTRAST:  OMNIPAQUE IOHEXOL 350 MG/ML SOLN COMPARISON:  Same day MRI.  MRA 10/10/2009 FINDINGS: CT HEAD Brain: Small infarcts are better characterized on same day MRI. No evidence of acute hemorrhage, hydrocephalus, extra-axial collection, mass lesion or abnormal mass effect. Chronic microvascular ischemic change and diffuse cerebral atrophy with ex vacuo ventricular dilation Vascular: Calcific atherosclerosis. Skull: No acute fracture. Sinuses/Orbits: No acute findings. CTA NECK Aortic arch: Great vessel origins are patent. Right carotid system: Tortuous, retropharyngeal course. No evidence of significant stenosis. Left carotid system: Tortuous, retropharyngeal course. There is moderate short segment narrowing of the internal carotid artery just proximal to the skull base.  Vertebral arteries:Left dominant. No evidence of significant stenosis or occlusion. Tortuous. Skeleton: Multilevel severe degenerative disc disease of the cervical spine. Other neck: Cachectic with very little fat within the neck. This limits evaluation without obvious mass or adenopathy. Upper Chest: Filling defect within the left pulmonary artery extending into the partially visualized left lobar pulmonary arteries, compatible with pulmonary embolism. CTA HEAD Anterior circulation: Predominately calcific atherosclerosis of the left cavernous carotid artery without evidence of greater than 50%  stenosis. Severe narrowing of a proximal right M2 MCA branch (see series 10, image 124) with multifocal moderate narrowing of the more distal M2 MCA branches. Moderate narrowing of a proximal left M2 MCA branch (see series 10, image 115). 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 Posterior circulation: Dysplastic appearance of the proximal basilar artery, which is widely patent. Multifocal severe stenosis of bilateral P1 and P2 PCAs. Venous sinuses: Poorly visualized given arterial timing. Within this limitation, no at evidence of dural sinus thrombosis. IMPRESSION: 1. Filling defect within the left pulmonary artery extending into the partially visualized left lobar pulmonary arteries, compatible with pulmonary embolism. Recommend CTA of the chest when renal function allows to further evaluate and to also exclude hilar malignancy. 2. Multifocal severe stenosis of bilateral P1 and P2 PCAs. 3. Severe stenosis of a proximal right M2 MCA branch with multifocal moderate more distal M2 MCA narrowing. 4. Moderate narrowing of a proximal left M2 MCA branch. 5. Moderate short-segment narrowing of the left internal carotid artery at the skull base. 6. 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 Recommend follow-up CTA in 1 year to  ensure stability. 7. Known small infarcts better characterized on same day MRI. Critical findings were called by telephone at the time of interpretation on 08/22/2020 at 1:56 pm to provider [facility] , who verbally acknowledged these results. Additional findings were discussed with Emunah Texidor at 2:08 p.m. via telephone. Electronically Signed   By: Feliberto Harts MD   On: 08/22/2020 14:15   MR BRAIN W WO CONTRAST  Result Date: 08/22/2020 CLINICAL DATA:  Neuro deficit, acute stroke suspected. Severe Alzheimer's. Altered mental status. EXAM: MRI HEAD WITHOUT AND WITH CONTRAST TECHNIQUE: Multiplanar, multiecho pulse sequences of the brain and surrounding structures were obtained without and with intravenous contrast. CONTRAST:  4.21mL GADAVIST GADOBUTROL 1 MMOL/ML IV SOLN COMPARISON:  CT head 08/06/2020. FINDINGS: Brain: Restricted diffusion involving the right putamen and the left corona radiata. Right putamen restricted diffusion is faint. Mild associated edema without significant mass effect. Mild enhancement involving the right putamen no midline shift. Marked mesial temporal lobe predominant atrophy with associated ex vacuo ventricular dilation. No mass lesion. Vascular: Major arterial flow voids are maintained at the skull base. Skull and upper cervical spine: Normal marrow signal. Sinuses/Orbits: No acute findings. Other: No mastoid effusions. IMPRESSION: 1. Small acute infarct involving the left corona radiata. Additional small infarct in the right putamen, possibly subacute given associated faint enhancement. Mild associated edema without significant mass effect. 2. Marked mesial temporal lobe predominant atrophy, as can be seen with Alzheimer's. Electronically Signed   By: Feliberto Harts MD   On: 08/22/2020 10:11   CT ABDOMEN PELVIS W CONTRAST  Result Date: 08/09/2020 CLINICAL DATA:  Hypoglycemia. EXAM: CT ABDOMEN AND PELVIS WITH CONTRAST TECHNIQUE: Multidetector CT imaging of the abdomen  and pelvis was performed using the standard protocol following bolus administration of intravenous contrast. CONTRAST:  80mL OMNIPAQUE IOHEXOL 300 MG/ML  SOLN COMPARISON:  None. FINDINGS: Lower chest: Mild atelectasis is seen within the posterior aspect of the left lung base. There is a small right pleural effusion. A 6.1 cm x 4.4 cm the heterogeneous well-defined soft tissue mass is seen within the anterior aspect of the lower chest wall, along the midline. Hepatobiliary: There is diffuse fatty infiltration of the liver no focal liver abnormality is seen. No gallstones, gallbladder wall thickening, or biliary dilatation. Pancreas: Unremarkable. No  pancreatic ductal dilatation or surrounding inflammatory changes. Spleen: Normal in size without focal abnormality. Adrenals/Urinary Tract: Adrenal glands are unremarkable. Kidneys are normal in size, without renal calculi or hydronephrosis. A 1.3 cm x 0.9 cm ill-defined mildly hyperdense area is seen within the anterior aspect of the mid left kidney (axial CT image 20, CT series number 3/sagittal reformatted image 82, CT series number 7). The urinary bladder is moderately distended and limited in evaluation secondary to overlying streak artifact. Stomach/Bowel: Stomach is within normal limits. The appendix is not clearly identified. No evidence of bowel dilatation. Numerous decompressed loops of small bowel are suspected within the right abdomen. A very large amount of stool is seen within the distal sigmoid colon and rectum. Vascular/Lymphatic: There is marked severity tortuosity of the abdominal aorta, without evidence of aneurysmal dilatation or dissection. No enlarged abdominal or pelvic lymph nodes. Reproductive: Status post hysterectomy. 2.4 cm x 2.0 cm and 2.5 cm x 2.3 cm well-defined areas of low attenuation are seen along the expected region of the left adnexa (axial CT images 48 through 53, CT series number 3/coronal reformatted images 26 through 38, CT series  number 6). Other: No abdominal wall hernia or abnormality. There is a mild amount of abdominopelvic ascites. Musculoskeletal: Bilateral total hip replacements are seen with associated streak artifact and subsequently limited evaluation of the adjacent osseous and soft tissue structures. Moderate severity dextroscoliosis of the lumbar spine is seen with multilevel degenerative changes. IMPRESSION: 1. Heterogeneous soft tissue mass within the anterior aspect of the lower chest wall, concerning for the presence of an underlying neoplastic process. 2. Small right pleural effusion. 3. Mild amount of abdominopelvic ascites. 4. Fatty liver. 5. Bilateral total hip replacements. 6. Very large amount of stool within the distal sigmoid colon and rectum. 7. Left adnexal masses which may represent large, complex ovarian cysts. Correlation with pelvic ultrasound is recommended. 8. Small hyperdense area within the left kidney which may represent a small renal mass. MRI correlation is recommended. Electronically Signed   By: Aram Candela M.D.   On: 2020/09/15 23:00   DG CHEST PORT 1 VIEW  Result Date: 08/31/2020 CLINICAL DATA:  Dyspnea, hypertension, short of breath EXAM: PORTABLE CHEST 1 VIEW COMPARISON:  09/15/2020 FINDINGS: Single frontal view of the chest demonstrates a stable cardiac silhouette. Continued ectasia of the thoracic aorta. There Is dense consolidation at the lung bases, left greater than right. No effusion or pneumothorax. No acute bony abnormalities. IMPRESSION: 1. Bibasilar lung consolidation new since prior study, favor atelectasis given rapid progression. Electronically Signed   By: Sharlet Salina M.D.   On: 08/31/2020 17:17   DG Chest Port 1 View  Result Date: 09/15/20 CLINICAL DATA:  Shortness of breath EXAM: PORTABLE CHEST 1 VIEW COMPARISON:  11/23/2018 FINDINGS: Cardiomegaly. There is hyperinflation of the lungs compatible with COPD. No confluent opacities or effusions. No acute bony  abnormality. IMPRESSION: Cardiomegaly, COPD. No active disease. Electronically Signed   By: Charlett Nose M.D.   On: 09-15-20 21:33   ECHOCARDIOGRAM COMPLETE  Result Date: 08/22/2020    ECHOCARDIOGRAM REPORT   Patient Name:   CASANDRA DALLAIRE Date of Exam: 08/22/2020 Medical Rec #:  [id number]         Height:       60.0 in Accession #:    [id number]        Weight:       100.0 lb Date of Birth:  September 16, 1930         BSA:  1.390 m Patient Age:    86 years          BP:           161/84 mmHg Patient Gender: F                 HR:           62 bpm. Exam Location:  Inpatient Procedure: 2D Echo, Cardiac Doppler and Color Doppler Indications:    Stroke 434.91 / I163.9  History:        Patient has no prior history of Echocardiogram examinations.                 Risk Factors:Hypertension and Non-Smoker.  Sonographer:    Renella Cunas RDCS Referring Phys: Lang Snow Daylene Katayama [facility] IMPRESSIONS  1. Left ventricular ejection fraction, by estimation, is 25 to 30%. The left ventricle has severely decreased function. There is akinesis of all mid-to-apical LV segments and apex. The basal LV segments have presevered contractility. Pattern consistent with possible takotsubo cardiomyopathy versus wrap around LAD. There is mild left ventricular hypertrophy of the basal-septal segment. Left ventricular diastolic parameters are indeterminate.  2. Right ventricular systolic function is normal. The right ventricular size is small. There is normal pulmonary artery systolic pressure.  3. Small, circumferential pericardial effusion is present measuring 8mm at end-diastole. There is no evidence of cardiac tamponade. Effusion is greatest along the free wall of the RV.  4. The mitral valve is degenerative. Mild mitral valve regurgitation.  5. Tricuspid valve regurgitation moderate-to-severe central tricuspid regurgitation.  6. The aortic valve is tricuspid. There is moderate calcification of the aortic valve. There is moderate thickening of the  aortic valve. There is at least moderate eccentric aortic regurgitation. Comparison(s): No prior Echocardiogram. Conclusion(s)/Recommendation(s): No intracardiac source of embolism detected on this transthoracic study. A transesophageal echocardiogram is recommended to exclude cardiac source of embolism if clinically indicated. FINDINGS  Left Ventricle: Left ventricular ejection fraction, by estimation, is 25 to 30%. The left ventricle has severely decreased function. The left ventricle demonstrates regional wall motion abnormalities. There is akinesis of all mid-to-apical LV segments and apex. The basal LV segments contract normally. Pattern consistent with possible takotsubo cardiomyopathy versus wrap-around LAD. The left ventricular internal cavity size was normal in size. There is mild left ventricular hypertrophy of the basal-septal segment. Left ventricular diastolic parameters are indeterminate. Right Ventricle: The right ventricular size is small. Right vetricular wall thickness was not assessed. Right ventricular systolic function is normal. There is normal pulmonary artery systolic pressure. The tricuspid regurgitant velocity is 2.41 m/s, and  with an assumed right atrial pressure of 8 mmHg, the estimated right ventricular systolic pressure is 31.2 mmHg. Left Atrium: Left atrial size was normal in size. Right Atrium: Right atrial size was normal in size. Pericardium: Small, circumferential pericardial effusion is present measuring 8mm at end-diastole. There is no evidence of cardiac tamponade. Effusion is greatest along the RV free wall. Mitral Valve: The mitral valve is degenerative in appearance. There is moderate thickening of the mitral valve leaflet(s). There is mild calcification of the mitral valve leaflet(s). Mild mitral valve regurgitation. Tricuspid Valve: The tricuspid valve is normal in structure. Tricuspid valve regurgitation moderate-to-severe central tricuspid regurgitation. Aortic Valve:  The aortic valve is tricuspid. There is moderate calcification of the aortic valve. There is moderate thickening of the aortic valve. There is moderate aortic valve annular calcification. There is at least moderate eccentric aortic regurgitation. Mild to moderate aortic valve sclerosis/calcification is  present, without any evidence of aortic stenosis. Aortic valve mean gradient measures 2.0 mmHg. Aortic valve peak gradient measures 5.1 mmHg. Aortic valve area, by VTI measures 2.05 cm. Pulmonic Valve: The pulmonic valve was normal in structure. Pulmonic valve regurgitation is trivial. Aorta: The aortic root and ascending aorta are structurally normal, with no evidence of dilitation. IAS/Shunts: No atrial level shunt detected by color flow Doppler.  LEFT VENTRICLE PLAX 2D LVIDd:         4.10 cm LVIDs:         3.10 cm LV PW:         0.80 cm LV IVS:        0.80 cm LVOT diam:     1.80 cm LV SV:         52 LV SV Index:   37 LVOT Area:     2.54 cm  LV Volumes (MOD) LV vol d, MOD A2C: 107.0 ml LV vol d, MOD A4C: 97.9 ml LV vol s, MOD A2C: 69.8 ml LV vol s, MOD A4C: 64.5 ml LV SV MOD A2C:     37.2 ml LV SV MOD A4C:     97.9 ml LV SV MOD BP:      36.4 ml RIGHT VENTRICLE TAPSE (M-mode): 1.1 cm LEFT ATRIUM           Index       RIGHT ATRIUM           Index LA diam:      2.20 cm 1.58 cm/m  RA Area:     12.80 cm LA Vol (A2C): 22.3 ml 16.04 ml/m RA Volume:   30.80 ml  22.15 ml/m LA Vol (A4C): 12.7 ml 9.13 ml/m  AORTIC VALVE AV Area (Vmax):    1.91 cm AV Area (Vmean):   2.03 cm AV Area (VTI):     2.05 cm AV Vmax:           113.00 cm/s AV Vmean:          64.700 cm/s AV VTI:            0.252 m AV Peak Grad:      5.1 mmHg AV Mean Grad:      2.0 mmHg LVOT Vmax:         84.60 cm/s LVOT Vmean:        51.500 cm/s LVOT VTI:          0.203 m LVOT/AV VTI ratio: 0.81 AI PHT:            535 msec  AORTA Ao Root diam: 2.80 cm TRICUSPID VALVE TR Peak grad:   23.2 mmHg TR Vmax:        241.00 cm/s  SHUNTS Systemic VTI:  0.20 m Systemic  Diam: 1.80 cm Laurance Flatten MD Electronically signed by Laurance Flatten MD Signature Date/Time: 08/22/2020/5:09:06 PM    Final     Microbiology: No results found for this or any previous visit (from the past 240 hour(s)).   Labs: Basic Metabolic Panel: No results for input(s): NA, K, CL, CO2, GLUCOSE, BUN, CREATININE, CALCIUM, MG, PHOS in the last 168 hours. Liver Function Tests: No results for input(s): AST, ALT, ALKPHOS, BILITOT, PROT, ALBUMIN in the last 168 hours. No results for input(s): LIPASE, AMYLASE in the last 168 hours. No results for input(s): AMMONIA in the last 168 hours. CBC: No results for input(s): WBC, NEUTROABS, HGB, HCT, MCV, PLT in the last 168 hours. Cardiac Enzymes: No results for input(s):  CKTOTAL, CKMB, CKMBINDEX, TROPONINI in the last 168 hours. D-Dimer No results for input(s): DDIMER in the last 72 hours. BNP: Invalid input(s): POCBNP CBG: Recent Labs  Lab 08/29/20 1915 08/29/20 2319 08/31/20 0726  GLUCAP 221* 95 117*   Anemia work up No results for input(s): VITAMINB12, FOLATE, FERRITIN, TIBC, IRON, RETICCTPCT in the last 72 hours. Urinalysis    Component Value Date/Time   COLORURINE YELLOW 09/18/2020 2329   APPEARANCEUR CLEAR September 18, 2020 2329   LABSPEC 1.011 09-18-2020 2329   PHURINE 6.0 09/18/20 2329   GLUCOSEU >=500 (A) 2020/09/18 2329   HGBUR SMALL (A) 09/18/2020 2329   BILIRUBINUR NEGATIVE September 18, 2020 2329   KETONESUR NEGATIVE 09/18/20 2329   PROTEINUR NEGATIVE 09/18/20 2329   NITRITE NEGATIVE 2020/09/18 2329   LEUKOCYTESUR NEGATIVE 09-18-20 2329   Sepsis Labs Invalid input(s): PROCALCITONIN,  WBC,  LACTICIDVEN  SIGNED:  Pamella Pert, MD  [facility] 09/02/2020, 4:12 PM Pager   If 7PM-7AM, please contact night-coverage [email or web address] Password [email or web address]

## 2020-09-21 NOTE — Progress Notes (Signed)
Daily Progress Note   Patient Name: Erica Vasquez       Date: 09-23-2020 DOB: 1930-10-16  Age: 86 y.o. MRN#: [id number] Attending Physician: Leatha Gilding, MD Primary Care Physician: Mirna Mires, MD Admit Date: 08/09/2020  Reason for Consultation/Follow-up: Establishing goals of care  Subjective: Frail weak lady Not awake not alert Actively dying.    Length of Stay: 13  Current Medications: Scheduled Meds:    Continuous Infusions: . dextrose 10 mL/hr at 09/01/20 1500    PRN Meds: acetaminophen **OR** acetaminophen, dextrose, lip balm, LORazepam, methocarbamol, morphine injection, ondansetron **OR** ondansetron (ZOFRAN) IV, senna-docusate  Physical Exam         Patient is unresponsive Weak and frail Shallow respirations, some apneic pauses noted today.  Abdomen not distended Thin extremities  Vital Signs: BP (!) 89/55 (BP Location: Right Arm)   Pulse 76   Temp 97.7 F (36.5 C) (Rectal)   Resp 14   Ht 5' (1.524 m)   Wt 38.6 kg   SpO2 (!) 76%   BMI 16.62 kg/m  SpO2: SpO2: (!) 76 % O2 Device: O2 Device: Nasal Cannula O2 Flow Rate: O2 Flow Rate (L/min): 5 L/min  Intake/output summary:   Intake/Output Summary (Last 24 hours) at Sep 23, 2020 1258 Last data filed at 09/03/2020 1906 Gross per 24 hour  Intake 0 ml  Output --  Net 0 ml   LBM: Last BM Date: 09/02/20 Baseline Weight: Weight: 32.7 kg Most recent weight: Weight: 38.6 kg       Palliative Assessment/Data:    Flowsheet Rows     Most Recent Value  Intake Tab  Referral Department Hospitalist  Unit at Time of Referral Med/Surg Unit  Date Notified 08/22/20  Palliative Care Type New Palliative care  Reason for referral Clarify Goals of Care  Date of Admission 08/15/2020  Date first seen by  Palliative Care 08/22/20  # of days Palliative referral response time 0 Day(s)  # of days IP prior to Palliative referral 1  Clinical Assessment  Psychosocial & Spiritual Assessment  Palliative Care Outcomes      Patient Active Problem List   Diagnosis Date Noted  . Altered mental status   . Palliative care by specialist   . Acute metabolic encephalopathy 08/22/2020  . CVA (cerebral vascular accident) (HCC) 08/22/2020  . Pressure  injury of skin 08/22/2020  . Advanced care planning/counseling discussion   . Hypoglycemia   . Dementia without behavioral disturbance (HCC)   . Goals of care, counseling/discussion   . Postoperative anemia due to acute blood loss 11/27/2018  . Hip fracture (HCC) 11/23/2018  . Chronic pain of right knee 03/11/2018  . Pain in right hip 02/10/2018  . Anemia 02/04/2018  . Constipation due to opioid therapy 01/31/2018  . Alzheimer disease (HCC) 01/31/2018  . Malnutrition of moderate degree 01/27/2018  . Fall 01/25/2018  . Blindness 01/25/2018  . Hypokalemia 01/25/2018  . Lytic bone lesion of hip 01/25/2018  . Essential hypertension, benign     Palliative Care Assessment & Plan   Patient Profile:    Assessment: 86 year old lady with hypertension glaucoma advanced dementia, brought to the hospital because of altered mental status, found to have acute stroke, acute metabolic encephalopathy.  Patient also has acute systolic congestive heart failure, also concern for possible malignancy.  Recommendations/Plan: Patient continues in the actively dying process.  [name], however, she has more of a "sunken" appearance to her face this morning.  Medication list noted.  She is on opioids to be used on an as-needed basis for pain or shortness of breath.  Discussed with son at bedside Eboni Coval.  Discussed about comfort measures only and also discussed about differences between residential hospice and home with hospice.   [name] states he doesn't want to d.c supplemental O2, doesn't want to stop maintenance IV fluids, discussed about limited to no role of artificial hydration at end of life and also discussed about allowing natural peaceful passing rather than continuing interventions that will not contribute to comfort. All of his concerns addressed to the best of my ability, he states he does not want to give up on his mother and he states that he doesn't like the term hospice. I have discussed extensively with him on a daily basis.    Goals of Care and Additional Recommendations: Limitations on Scope of Treatment: No Lab Draws  Code Status:    Code Status Orders  (From admission, onward)         Start     Ordered   08/22/20 1912  Do not attempt resuscitation (DNR)  Continuous       Question Answer Comment  In the event of cardiac or respiratory ARREST Do not call a "code blue"   In the event of cardiac or respiratory ARREST Do not perform Intubation, CPR, defibrillation or ACLS   In the event of cardiac or respiratory ARREST Use medication by any route, position, wound care, and other measures to relive pain and suffering. May use oxygen, suction and manual treatment of airway obstruction as needed for comfort.      08/22/20 1911        Code Status History    Date Active Date Inactive Code Status Order ID Comments User Context   08/22/2020 0114 08/22/2020 1911 Full Code [id number]  Lilyan Gilford, DO ED   11/23/2018 0345 11/29/2018 1120 Full Code [id number]  Haydee Monica, MD ED   01/25/2018 2042 01/30/2018 2314 Full Code [id number]  Lorretta Harp, MD ED   Advance Care Planning Activity      Prognosis:  Hours - Days  Discharge Planning: Anticipated Hospital Death  Care plan was discussed with    patient's son Audriana Aldama at bedside.  Discussed with bedside RN. Thank you for allowing the Palliative Medicine Team to  assist in the care of this patient.   Time In: 11 Time Out: 11.35  Total Time 35 Prolonged Time Billed  no       Greater than 50%  of this time was spent counseling and coordinating care related to the above assessment and plan.  Rosalin Hawking, MD  Please contact Palliative Medicine Team phone at [phone] for questions and concerns.

## 2020-09-21 DEATH — deceased

## 2021-07-31 IMAGING — CT CT ABD-PELV W/ CM
2 of 5 series · 15 of 46 positions shown, 17 images · IV contrast (omnipaque)
Comparison: None.

CLINICAL DATA: Hypoglycemia.

EXAM:
CT ABDOMEN AND PELVIS WITH CONTRAST
TECHNIQUE: Multidetector CT imaging of the abdomen and pelvis was performed
using the standard protocol following bolus administration of
intravenous contrast.
CONTRAST:  80mL OMNIPAQUE IOHEXOL 300 MG/ML  SOLN

[Series 3: axial st · axial · 0.68mm/px · z∈[-758,-443]mm · 12 of 75 slices shown, 14 images]
[im 6/75  soft-tissue]
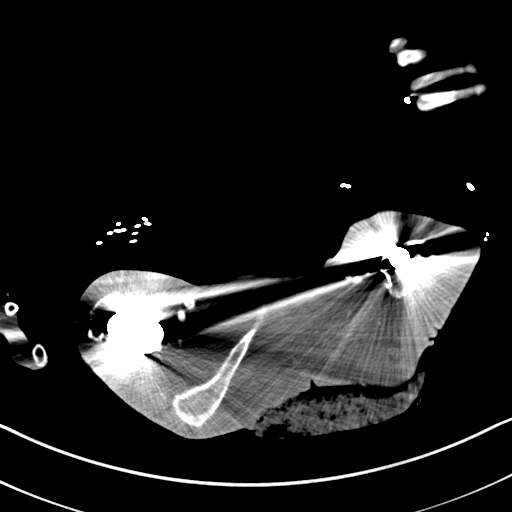
[im 6/75  bone]
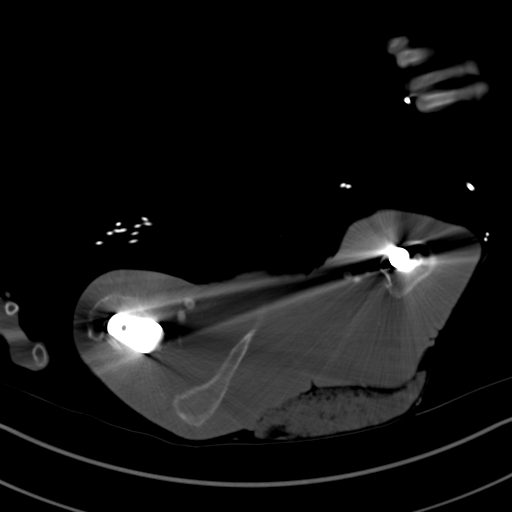
[im 11/75  soft-tissue]
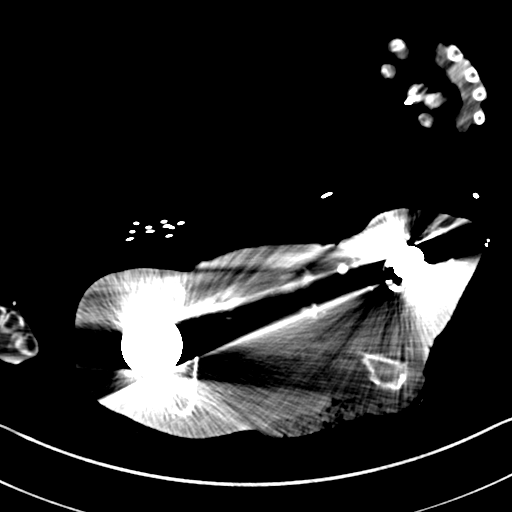
[im 22/75  soft-tissue]
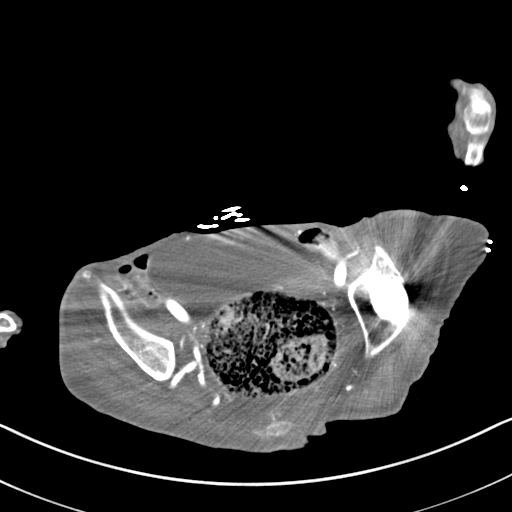
[im 27/75  soft-tissue]
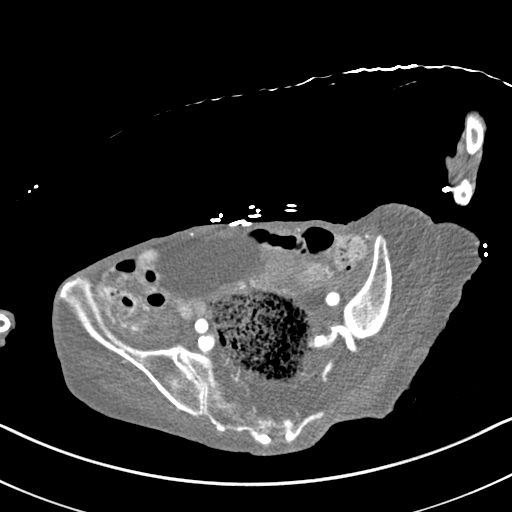
[im 32/75  soft-tissue]
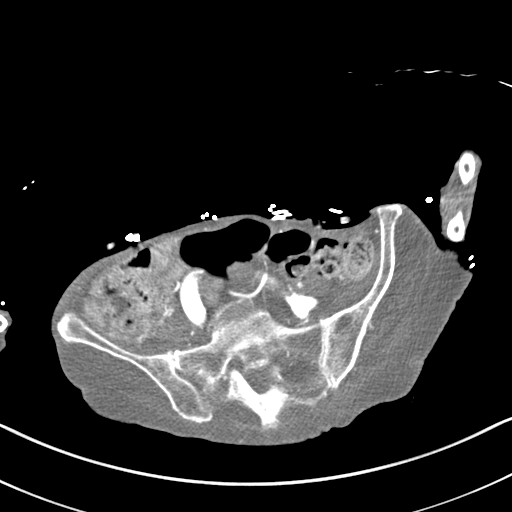
[im 38/75  soft-tissue]
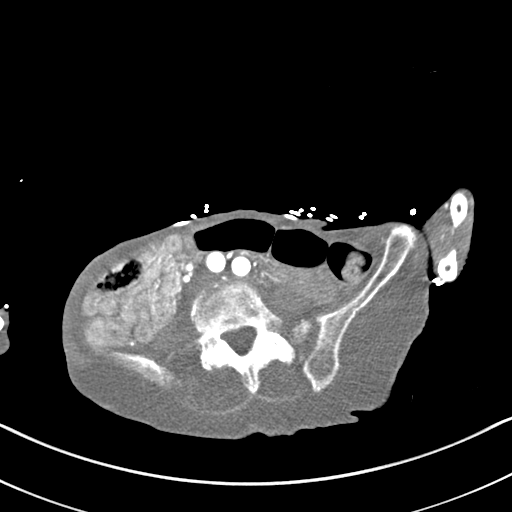
[im 43/75  soft-tissue]
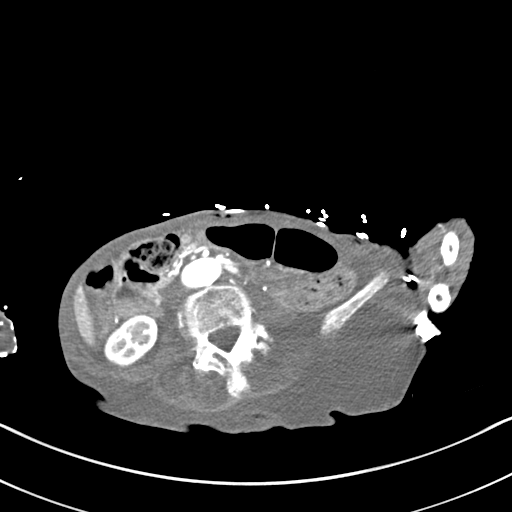
[im 48/75  soft-tissue]
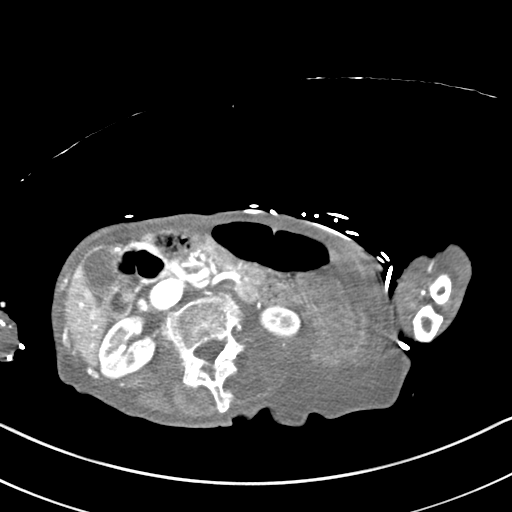
[im 53/75  soft-tissue]
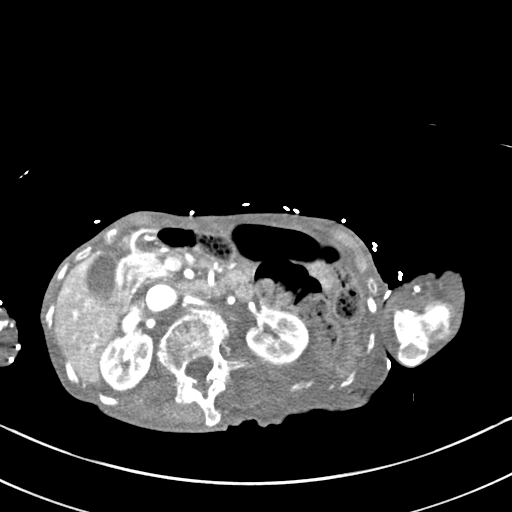
[im 53/75  bone]
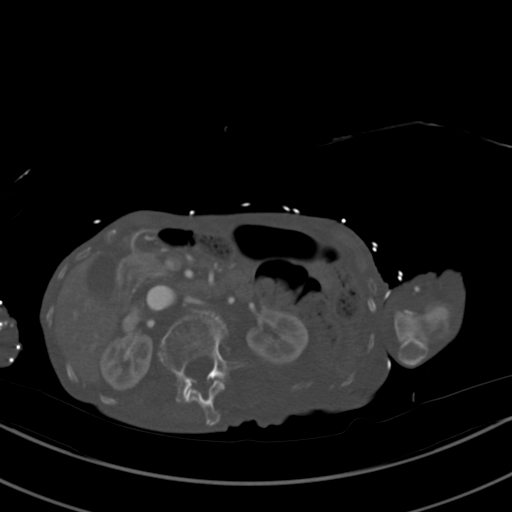
[im 59/75  soft-tissue]
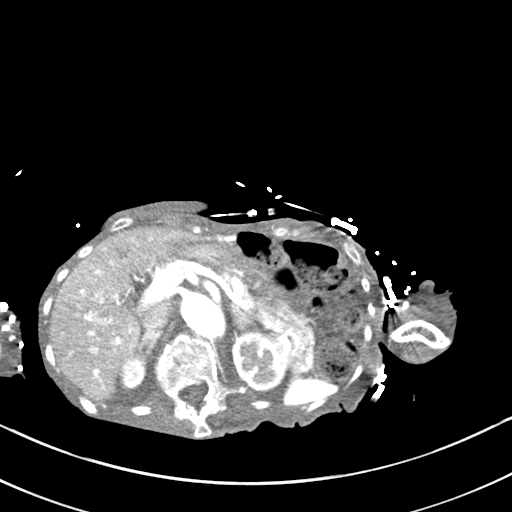
[im 64/75  soft-tissue]
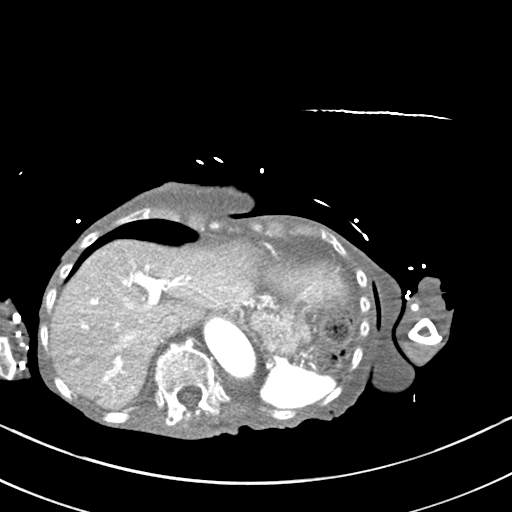
[im 69/75  soft-tissue]
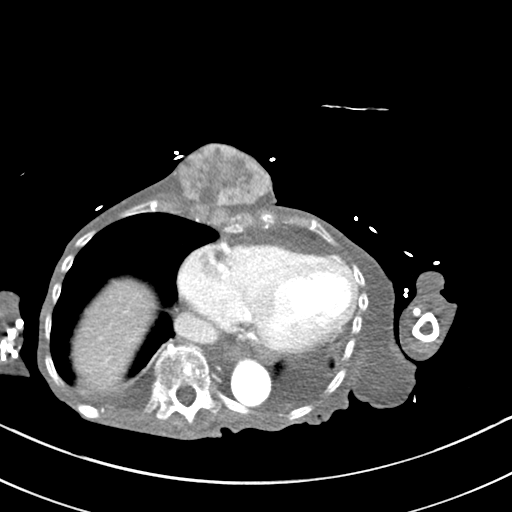

[Series 6: coronal st · coronal · 0.62mm/px · 3 of 99 slices shown]
[im 33/99  soft-tissue]
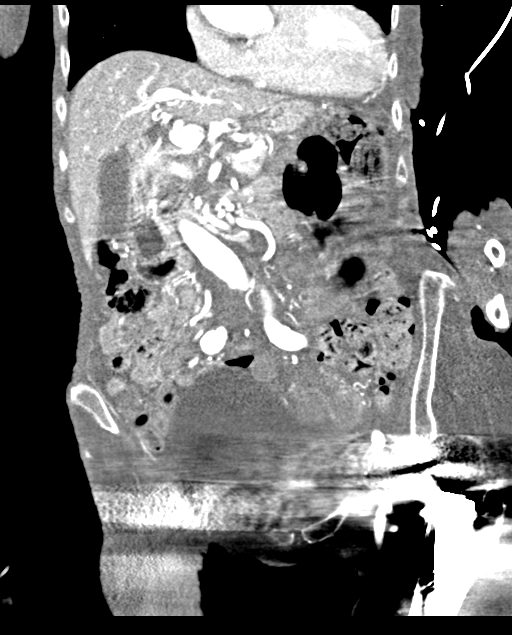
[im 44/99  soft-tissue]
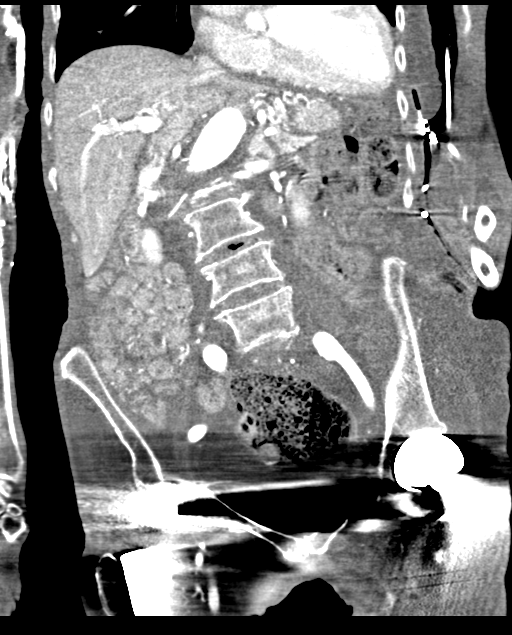
[im 55/99  soft-tissue]
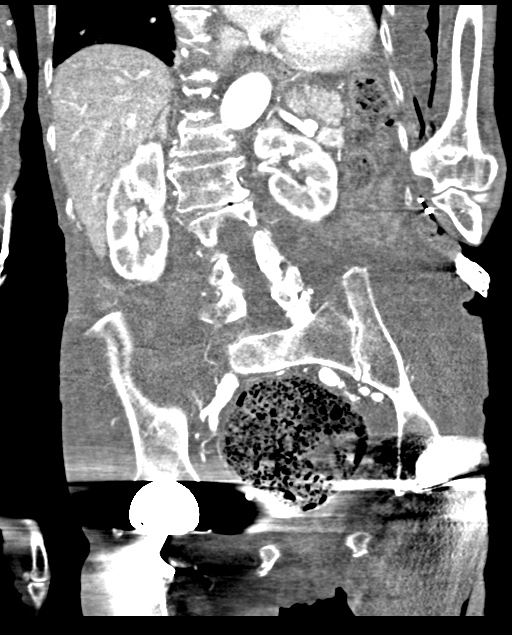

[15 of 46 positions shown; findings below may reference images not displayed]

FINDINGS: Lower chest: Mild atelectasis is seen within the posterior aspect of
the left lung base.

There is a small right pleural effusion.

A 6.1 cm x 4.4 cm the heterogeneous well-defined soft tissue mass is
seen within the anterior aspect of the lower chest wall, along the
midline.

Hepatobiliary: There is diffuse fatty infiltration of the liver no
focal liver abnormality is seen. No gallstones, gallbladder wall
thickening, or biliary dilatation.

Pancreas: Unremarkable. No pancreatic ductal dilatation or
surrounding inflammatory changes.

Spleen: Normal in size without focal abnormality.

Adrenals/Urinary Tract: Adrenal glands are unremarkable. Kidneys are
normal in size, without renal calculi or hydronephrosis. A 1.3 cm x
0.9 cm ill-defined mildly hyperdense area is seen within the
anterior aspect of the mid left kidney (axial CT image 20, CT series
number 3/sagittal reformatted image 82, CT series number 7). The
urinary bladder is moderately distended and limited in evaluation
secondary to overlying streak artifact.

Stomach/Bowel: Stomach is within normal limits. The appendix is not
clearly identified. No evidence of bowel dilatation. Numerous
decompressed loops of small bowel are suspected within the right
abdomen. A very large amount of stool is seen within the distal
sigmoid colon and rectum.

Vascular/Lymphatic: There is marked severity tortuosity of the
abdominal aorta, without evidence of aneurysmal dilatation or
dissection. No enlarged abdominal or pelvic lymph nodes.

Reproductive: Status post hysterectomy. 2.4 cm x 2.0 cm and 2.5 cm x
2.3 cm well-defined areas of low attenuation are seen along the
expected region of the left adnexa (axial CT images 48 through 53,
CT series number 3/coronal reformatted images 26 through 38, CT
series number 6).

Other: No abdominal wall hernia or abnormality. There is a mild
amount of abdominopelvic ascites.

Musculoskeletal: Bilateral total hip replacements are seen with
associated streak artifact and subsequently limited evaluation of
the adjacent osseous and soft tissue structures.

Moderate severity dextroscoliosis of the lumbar spine is seen with
multilevel degenerative changes.
IMPRESSION: 1. Heterogeneous soft tissue mass within the anterior aspect of the
lower chest wall, concerning for the presence of an underlying
neoplastic process.
2. Small right pleural effusion.
3. Mild amount of abdominopelvic ascites.
4. Fatty liver.
5. Bilateral total hip replacements.
6. Very large amount of stool within the distal sigmoid colon and
rectum.
7. Left adnexal masses which may represent large, complex ovarian
cysts. Correlation with pelvic ultrasound is recommended.
8. Small hyperdense area within the left kidney which may represent
a small renal mass. MRI correlation is recommended.
